# Patient Record
Sex: Female | Born: 1935
Health system: Southern US, Community
[De-identification: ages and names within clinical notes are randomized; demographics above are authoritative.]

## PROBLEM LIST (undated history)

## (undated) DIAGNOSIS — M1990 Unspecified osteoarthritis, unspecified site: Secondary | ICD-10-CM

## (undated) DIAGNOSIS — N8110 Cystocele, unspecified: Secondary | ICD-10-CM

## (undated) DIAGNOSIS — I10 Essential (primary) hypertension: Secondary | ICD-10-CM

## (undated) DIAGNOSIS — K8020 Calculus of gallbladder without cholecystitis without obstruction: Secondary | ICD-10-CM

## (undated) HISTORY — PX: ABDOMINAL HYSTERECTOMY: SHX81

## (undated) HISTORY — PX: HEMORROIDECTOMY: SUR656

## (undated) HISTORY — PX: KNEE SURGERY: SHX244

---

## 1999-05-31 ENCOUNTER — Encounter: Payer: Self-pay | Admitting: Emergency Medicine

## 1999-05-31 ENCOUNTER — Emergency Department (HOSPITAL_COMMUNITY): Admission: EM | Admit: 1999-05-31 | Discharge: 1999-05-31 | Payer: Self-pay | Admitting: Emergency Medicine

## 2000-09-22 ENCOUNTER — Encounter: Admission: RE | Admit: 2000-09-22 | Discharge: 2000-09-22 | Payer: Self-pay | Admitting: Obstetrics and Gynecology

## 2000-09-22 ENCOUNTER — Encounter: Payer: Self-pay | Admitting: Obstetrics and Gynecology

## 2000-09-25 ENCOUNTER — Encounter: Payer: Self-pay | Admitting: Obstetrics and Gynecology

## 2000-09-25 ENCOUNTER — Encounter: Admission: RE | Admit: 2000-09-25 | Discharge: 2000-09-25 | Payer: Self-pay | Admitting: Obstetrics and Gynecology

## 2001-03-01 ENCOUNTER — Encounter: Payer: Self-pay | Admitting: General Surgery

## 2001-03-01 ENCOUNTER — Ambulatory Visit (HOSPITAL_COMMUNITY): Admission: RE | Admit: 2001-03-01 | Discharge: 2001-03-01 | Payer: Self-pay | Admitting: General Surgery

## 2001-04-20 ENCOUNTER — Encounter: Admission: RE | Admit: 2001-04-20 | Discharge: 2001-04-20 | Payer: Self-pay | Admitting: Internal Medicine

## 2001-04-20 ENCOUNTER — Encounter: Payer: Self-pay | Admitting: Internal Medicine

## 2002-06-22 ENCOUNTER — Encounter: Payer: Self-pay | Admitting: Internal Medicine

## 2002-06-22 ENCOUNTER — Encounter: Admission: RE | Admit: 2002-06-22 | Discharge: 2002-06-22 | Payer: Self-pay | Admitting: Internal Medicine

## 2003-09-01 ENCOUNTER — Encounter: Admission: RE | Admit: 2003-09-01 | Discharge: 2003-09-01 | Payer: Self-pay | Admitting: Internal Medicine

## 2003-09-01 ENCOUNTER — Encounter: Payer: Self-pay | Admitting: Internal Medicine

## 2004-09-20 ENCOUNTER — Encounter: Admission: RE | Admit: 2004-09-20 | Discharge: 2004-09-20 | Payer: Self-pay | Admitting: Internal Medicine

## 2005-08-07 ENCOUNTER — Ambulatory Visit: Payer: Self-pay | Admitting: Gastroenterology

## 2005-09-02 ENCOUNTER — Ambulatory Visit: Payer: Self-pay | Admitting: Gastroenterology

## 2005-09-10 ENCOUNTER — Ambulatory Visit: Payer: Self-pay | Admitting: Gastroenterology

## 2005-10-14 ENCOUNTER — Ambulatory Visit: Payer: Self-pay | Admitting: Gastroenterology

## 2005-12-31 ENCOUNTER — Encounter: Admission: RE | Admit: 2005-12-31 | Discharge: 2005-12-31 | Payer: Self-pay | Admitting: Obstetrics and Gynecology

## 2007-03-25 ENCOUNTER — Encounter: Admission: RE | Admit: 2007-03-25 | Discharge: 2007-03-25 | Payer: Self-pay | Admitting: Internal Medicine

## 2009-02-16 ENCOUNTER — Encounter: Admission: RE | Admit: 2009-02-16 | Discharge: 2009-02-16 | Payer: Self-pay | Admitting: Internal Medicine

## 2009-10-01 ENCOUNTER — Encounter: Admission: RE | Admit: 2009-10-01 | Discharge: 2009-10-01 | Payer: Self-pay | Admitting: Specialist

## 2010-05-01 ENCOUNTER — Ambulatory Visit: Payer: Self-pay | Admitting: Surgery

## 2010-05-01 ENCOUNTER — Ambulatory Visit
Admission: RE | Admit: 2010-05-01 | Discharge: 2010-05-01 | Payer: Self-pay | Source: Home / Self Care | Admitting: Specialist

## 2011-01-19 ENCOUNTER — Encounter: Payer: Self-pay | Admitting: Internal Medicine

## 2011-11-21 ENCOUNTER — Encounter: Payer: Self-pay | Admitting: *Deleted

## 2011-11-21 ENCOUNTER — Emergency Department (HOSPITAL_COMMUNITY)
Admission: EM | Admit: 2011-11-21 | Discharge: 2011-11-21 | Disposition: A | Discharge status: Home / Self Care | Payer: Medicare Other | Attending: Emergency Medicine | Admitting: Emergency Medicine

## 2011-11-21 DIAGNOSIS — Z79899 Other long term (current) drug therapy: Secondary | ICD-10-CM | POA: Insufficient documentation

## 2011-11-21 DIAGNOSIS — Z7982 Long term (current) use of aspirin: Secondary | ICD-10-CM | POA: Insufficient documentation

## 2011-11-21 DIAGNOSIS — S61219A Laceration without foreign body of unspecified finger without damage to nail, initial encounter: Secondary | ICD-10-CM

## 2011-11-21 DIAGNOSIS — I10 Essential (primary) hypertension: Secondary | ICD-10-CM | POA: Insufficient documentation

## 2011-11-21 DIAGNOSIS — S61209A Unspecified open wound of unspecified finger without damage to nail, initial encounter: Secondary | ICD-10-CM | POA: Insufficient documentation

## 2011-11-21 DIAGNOSIS — Z9889 Other specified postprocedural states: Secondary | ICD-10-CM | POA: Insufficient documentation

## 2011-11-21 DIAGNOSIS — W260XXA Contact with knife, initial encounter: Secondary | ICD-10-CM | POA: Insufficient documentation

## 2011-11-21 DIAGNOSIS — W261XXA Contact with sword or dagger, initial encounter: Secondary | ICD-10-CM | POA: Insufficient documentation

## 2011-11-21 DIAGNOSIS — Y93G1 Activity, food preparation and clean up: Secondary | ICD-10-CM | POA: Insufficient documentation

## 2011-11-21 HISTORY — DX: Essential (primary) hypertension: I10

## 2011-11-21 MED ORDER — TETANUS-DIPHTH-ACELL PERTUSSIS 5-2.5-18.5 LF-MCG/0.5 IM SUSP: 0.5000 mL | Freq: Once | INTRAMUSCULAR | Status: DC

## 2011-11-21 MED ORDER — TETANUS-DIPHTH-ACELL PERTUSSIS 5-2.5-18.5 LF-MCG/0.5 IM SUSP
INTRAMUSCULAR | Status: AC
  Administered 2011-11-21: 0.5 mL via INTRAMUSCULAR
  Filled 2011-11-21: qty 0.5

## 2011-11-21 NOTE — ED Notes (Signed)
dermabond to bedside for md.

## 2011-11-21 NOTE — ED Notes (Signed)
Washing dishes in sink, knife cut finger, lac to tip of R ring finger, bleeding controlled with dressing. Td UTD <5 yrs.

## 2011-11-21 NOTE — ED Provider Notes (Signed)
History     CSN: [id number] Arrival date & time: 11/21/2011  6:47 AM   First MD Initiated Contact with Patient 11/21/11 [phone]      Chief Complaint  Patient presents with  . Laceration    R ring finger tip lac    (Consider location/radiation/quality/duration/timing/severity/associated sxs/prior treatment) Patient is a 77 y.o. female presenting with skin laceration.  Laceration    laceration right ring finger that occurred just prior to arrival while washing dishes.  No difficulty ranging her finger.  She reports mild bleeding at this time.  Takes aspirin.  She has otherwise minimal medical problems.  She has no numbness or tingling.  Her symptoms are constant.  Her tetanus is not up-to-date.  Her symptoms are mild  Past Medical History  Diagnosis Date  . Hypertension     Past Surgical History  Procedure Date  . Abdominal hysterectomy   . Hemorroidectomy   . Knee surgery     History reviewed. No pertinent family history.  History  Substance Use Topics  . Smoking status: Never Smoker   . Smokeless tobacco: Not on file  . Alcohol Use: No    OB History    Grav Para Term Preterm Abortions TAB SAB Ect Mult Living                  Review of Systems  All other systems reviewed and are negative.    Allergies  Review of patient's allergies indicates no known allergies.  Home Medications   Current Outpatient Rx  Name Route Sig Dispense Refill  . BIOTIN 2.5 MG PO TABS Oral Take 1 tablet by mouth daily.      Marland Kitchen CALCIUM 600 + D PO Oral Take 1 tablet by mouth 2 (two) times daily.      Marland Kitchen CITALOPRAM HYDROBROMIDE 20 MG PO TABS Oral Take 20 mg by mouth daily.      Marland Kitchen ESTRADIOL 0.5 MG PO TABS Oral Take 0.5 mg by mouth daily.      . ONE-A-DAY WOMENS 50 PLUS PO Oral Take 1 tablet by mouth daily.      Marland Kitchen FISH OIL PO Oral Take 1 tablet by mouth daily.      Marland Kitchen POLYETHYLENE GLYCOL 3350 PO POWD Oral Take 17 g by mouth daily.      Marland Kitchen VALSARTAN-HYDROCHLOROTHIAZIDE 320-25 MG PO TABS  Oral Take 1 tablet by mouth daily.      Marland Kitchen VITAMIN E PO Oral Take 1 tablet by mouth daily.        BP 179/81  Pulse 64  Temp(Src) 97.7 F (36.5 C) (Oral)  Resp 20  SpO2 95%  Physical Exam  Constitutional: She is oriented to person, place, and time. She appears well-developed and well-nourished.  HENT:  Head: Normocephalic.  Eyes: EOM are normal.  Neck: Normal range of motion.  Pulmonary/Chest: Effort normal.  Musculoskeletal: Normal range of motion.       Distal phalanx of right ring finger with superficial laceration.  Patient has normal flexion of her right ring finger.  The laceration is on the volar surface  Neurological: She is alert and oriented to person, place, and time.  Psychiatric: She has a normal mood and affect.    ED Course  Procedures (including critical care time)  LACERATION REPAIR Performed by: Lyanne Co Consent: Verbal consent obtained. Risks and benefits: risks, benefits and alternatives were discussed Patient identity confirmed: provided demographic data Time out performed prior to procedure Prepped and Draped in normal  sterile fashion Wound explored  Laceration Location: Volar surface of left ring finger distal phalynx  Laceration Length: 1cm  No Foreign Bodies seen or palpated  Anesthesia: local infiltration  Local anesthetic:none   Irrigation method: skin scrub Amount of cleaning: standard  Skin closure: close  Technique: Tissue Adhesive  Patient tolerance: Patient tolerated the procedure well with no immediate complications.   Labs Reviewed - No data to display No results found.   1. Laceration of finger       MDM  Repaired with Dermabond.  Infection warnings given.  Normal tendon function         Lyanne Co, MD 11/21/11 [phone]

## 2013-11-10 ENCOUNTER — Encounter: Payer: Self-pay | Admitting: Podiatry

## 2013-11-10 ENCOUNTER — Ambulatory Visit (INDEPENDENT_AMBULATORY_CARE_PROVIDER_SITE_OTHER): Payer: Medicare Other | Admitting: Podiatry

## 2013-11-10 VITALS — BP 157/83 | HR 66 | Resp 16

## 2013-11-10 DIAGNOSIS — B351 Tinea unguium: Secondary | ICD-10-CM

## 2013-11-10 NOTE — Progress Notes (Signed)
N-DICOLORATION L-LT FOOT GREAT TOENAIL D-7 MONTHS O-SLOWLY C-SLOWLY A-SAME T-N /A

## 2013-11-10 NOTE — Progress Notes (Signed)
Subjective:     Patient ID: Kimberly Mcgee, female   DOB: [birth year], 79 y.o.   MRN: [id number]  HPI patient presents stating I am concerned about my left big toenail it is yellow and seems like it's not connected but not currently painful   Review of Systems     Objective:   Physical Exam  Nursing note and vitals reviewed. Constitutional: She is oriented to person, place, and time.  Cardiovascular: Intact distal pulses.   Neurological: She is oriented to person, place, and time.  Skin: Skin is warm.   hallux nail left is yellow on the distal one third and mildly loosened from the underlying nailbed with no pain when palpated     Assessment:     Mycotic nail infection with probable trauma left big toe    Plan:     Educated patient and carefully clean the distal corner. This should be self limiting and should grow out

## 2014-03-10 ENCOUNTER — Other Ambulatory Visit: Payer: Self-pay | Admitting: Internal Medicine

## 2014-03-10 DIAGNOSIS — R109 Unspecified abdominal pain: Secondary | ICD-10-CM

## 2014-03-17 ENCOUNTER — Ambulatory Visit
Admission: RE | Admit: 2014-03-17 | Discharge: 2014-03-17 | Disposition: A | Discharge status: Home / Self Care | Payer: Commercial Managed Care - HMO | Source: Ambulatory Visit | Attending: Internal Medicine | Admitting: Internal Medicine

## 2014-03-17 DIAGNOSIS — R109 Unspecified abdominal pain: Secondary | ICD-10-CM

## 2014-03-28 ENCOUNTER — Other Ambulatory Visit: Payer: Self-pay | Admitting: Internal Medicine

## 2014-03-28 DIAGNOSIS — K862 Cyst of pancreas: Secondary | ICD-10-CM

## 2014-04-04 ENCOUNTER — Ambulatory Visit
Admission: RE | Admit: 2014-04-04 | Discharge: 2014-04-04 | Disposition: A | Discharge status: Home / Self Care | Payer: Commercial Managed Care - HMO | Source: Ambulatory Visit | Attending: Internal Medicine | Admitting: Internal Medicine

## 2014-04-04 DIAGNOSIS — K862 Cyst of pancreas: Secondary | ICD-10-CM

## 2014-04-04 MED ORDER — GADOBENATE DIMEGLUMINE 529 MG/ML IV SOLN
15.0000 mL | Freq: Once | INTRAVENOUS | Status: AC | PRN
  Administered 2014-04-04: 15 mL via INTRAVENOUS

## 2014-04-07 ENCOUNTER — Other Ambulatory Visit: Payer: Self-pay | Admitting: Internal Medicine

## 2014-04-07 DIAGNOSIS — R935 Abnormal findings on diagnostic imaging of other abdominal regions, including retroperitoneum: Secondary | ICD-10-CM

## 2014-04-07 DIAGNOSIS — K862 Cyst of pancreas: Secondary | ICD-10-CM

## 2014-10-05 ENCOUNTER — Inpatient Hospital Stay: Admission: RE | Admit: 2014-10-05 | Payer: Commercial Managed Care - HMO | Source: Ambulatory Visit

## 2014-10-05 ENCOUNTER — Other Ambulatory Visit: Payer: Commercial Managed Care - HMO

## 2014-10-11 ENCOUNTER — Ambulatory Visit
Admission: RE | Admit: 2014-10-11 | Discharge: 2014-10-11 | Disposition: A | Discharge status: Home / Self Care | Payer: Commercial Managed Care - HMO | Source: Ambulatory Visit | Attending: Internal Medicine | Admitting: Internal Medicine

## 2014-10-11 DIAGNOSIS — R935 Abnormal findings on diagnostic imaging of other abdominal regions, including retroperitoneum: Secondary | ICD-10-CM

## 2014-10-11 DIAGNOSIS — K862 Cyst of pancreas: Secondary | ICD-10-CM

## 2014-10-11 MED ORDER — IOHEXOL 300 MG/ML  SOLN
100.0000 mL | Freq: Once | INTRAMUSCULAR | Status: AC | PRN
  Administered 2014-10-11: 100 mL via INTRAVENOUS

## 2015-01-04 ENCOUNTER — Encounter: Payer: Self-pay | Admitting: Gastroenterology

## 2015-08-25 ENCOUNTER — Encounter: Payer: Self-pay | Admitting: Internal Medicine

## 2015-09-25 ENCOUNTER — Emergency Department (HOSPITAL_COMMUNITY): Payer: Commercial Managed Care - HMO

## 2015-09-25 ENCOUNTER — Encounter (HOSPITAL_COMMUNITY): Payer: Self-pay

## 2015-09-25 ENCOUNTER — Emergency Department (HOSPITAL_COMMUNITY)
Admission: EM | Admit: 2015-09-25 | Discharge: 2015-09-25 | Disposition: A | Discharge status: Home / Self Care | Payer: Commercial Managed Care - HMO | Attending: Emergency Medicine | Admitting: Emergency Medicine

## 2015-09-25 DIAGNOSIS — N390 Urinary tract infection, site not specified: Secondary | ICD-10-CM | POA: Insufficient documentation

## 2015-09-25 DIAGNOSIS — I10 Essential (primary) hypertension: Secondary | ICD-10-CM | POA: Diagnosis not present

## 2015-09-25 DIAGNOSIS — Z79899 Other long term (current) drug therapy: Secondary | ICD-10-CM | POA: Diagnosis not present

## 2015-09-25 DIAGNOSIS — R410 Disorientation, unspecified: Secondary | ICD-10-CM | POA: Insufficient documentation

## 2015-09-25 DIAGNOSIS — R4182 Altered mental status, unspecified: Secondary | ICD-10-CM | POA: Diagnosis present

## 2015-09-25 LAB — COMPREHENSIVE METABOLIC PANEL
ALBUMIN: 4.3 g/dL (ref 3.5–5.0)
ALT: 17 U/L (ref 14–54)
ANION GAP: 10 (ref 5–15)
AST: 28 U/L (ref 15–41)
Alkaline Phosphatase: 41 U/L (ref 38–126)
BUN: 21 mg/dL — ABNORMAL HIGH (ref 6–20)
CHLORIDE: 100 mmol/L — AB (ref 101–111)
CO2: 23 mmol/L (ref 22–32)
Calcium: 10.2 mg/dL (ref 8.9–10.3)
Creatinine, Ser: 0.91 mg/dL (ref 0.44–1.00)
GFR calc non Af Amer: 55 mL/min — ABNORMAL LOW (ref >60)
Glucose, Bld: 111 mg/dL — ABNORMAL HIGH (ref 65–99)
Potassium: 3.8 mmol/L (ref 3.5–5.1)
SODIUM: 133 mmol/L — AB (ref 135–145)
Total Bilirubin: 0.5 mg/dL (ref 0.3–1.2)
Total Protein: 7.6 g/dL (ref 6.5–8.1)

## 2015-09-25 LAB — URINALYSIS, ROUTINE W REFLEX MICROSCOPIC
BILIRUBIN URINE: NEGATIVE
Glucose, UA: NEGATIVE mg/dL
Ketones, ur: NEGATIVE mg/dL
NITRITE: NEGATIVE
PH: 5.5 (ref 5.0–8.0)
Protein, ur: NEGATIVE mg/dL
SPECIFIC GRAVITY, URINE: 1.013 (ref 1.005–1.030)
Urobilinogen, UA: 0.2 mg/dL (ref 0.0–1.0)

## 2015-09-25 LAB — CBC
HEMATOCRIT: 40.6 % (ref 36.0–46.0)
HEMOGLOBIN: 13.6 g/dL (ref 12.0–15.0)
MCH: 30.2 pg (ref 26.0–34.0)
MCHC: 33.5 g/dL (ref 30.0–36.0)
MCV: 90.2 fL (ref 78.0–100.0)
Platelets: 258 10*3/uL (ref 150–400)
RBC: 4.5 MIL/uL (ref 3.87–5.11)
RDW: 14.1 % (ref 11.5–15.5)
WBC: 11.8 10*3/uL — ABNORMAL HIGH (ref 4.0–10.5)

## 2015-09-25 LAB — URINE MICROSCOPIC-ADD ON

## 2015-09-25 LAB — CBG MONITORING, ED: Glucose-Capillary: 101 mg/dL — ABNORMAL HIGH (ref 65–99)

## 2015-09-25 MED ORDER — CEPHALEXIN 500 MG PO CAPS: 500.0000 mg | ORAL_CAPSULE | Freq: Two times a day (BID) | ORAL | Status: DC

## 2015-09-25 NOTE — ED Provider Notes (Signed)
CSN: [id number]     Arrival date & time 09/25/15  1133 History   First MD Initiated Contact with Patient 09/25/15 1207     Chief Complaint  Patient presents with  . Altered Mental Status     (Consider location/radiation/quality/duration/timing/severity/associated sxs/prior Treatment) HPI  81 year old female presents with altered mental status that has essentially resolved. Started around 10:30 after getting out of a procedure at her OB/GYN where she had a pessary removed. Patient typically has this removed every 3 months and changed. After the procedure, where she was not given any sedating medicines, she had trouble remembering where her car was it seemed confused about getting onto the bed or going in which direction. Is definitely abnormal for her. By the time the son arrived 30 minutes later she was essentially back to normal for him. No fevers, vomiting, headache, weakness, slurred speech. No urinary symptoms.  Past Medical History  Diagnosis Date  . Hypertension    Past Surgical History  Procedure Laterality Date  . Abdominal hysterectomy    . Hemorroidectomy    . Knee surgery     History reviewed. No pertinent family history. Social History  Substance Use Topics  . Smoking status: Never Smoker   . Smokeless tobacco: None  . Alcohol Use: No   OB History    No data available     Review of Systems  Constitutional: Negative for fever.  Respiratory: Negative for cough.   Cardiovascular: Negative for chest pain.  Genitourinary: Negative for dysuria.  Neurological: Negative for speech difficulty, weakness, numbness and headaches.  Psychiatric/Behavioral: Positive for confusion.  All other systems reviewed and are negative.     Allergies  Review of patient's allergies indicates no known allergies.  Home Medications   Prior to Admission medications   Medication Sig Start Date End Date Taking? Authorizing Provider  BIOTIN PO Take 1 tablet by mouth daily.   Yes  Historical Provider, MD  Calcium Carbonate-Vitamin D (CALCIUM 600 + D PO) Take 1 tablet by mouth 2 (two) times daily.     Yes Historical Provider, MD  citalopram (CELEXA) 20 MG tablet Take 20 mg by mouth daily.     Yes Historical Provider, MD  loratadine (CLARITIN) 10 MG tablet Take 10 mg by mouth daily as needed for allergies.   Yes Historical Provider, MD  losartan-hydrochlorothiazide (HYZAAR) 100-25 MG per tablet Take 1 tablet by mouth daily.  09/12/13  Yes Historical Provider, MD  Multiple Vitamins-Minerals (ONE-A-DAY WOMENS 50 PLUS PO) Take 1 tablet by mouth daily.     Yes Historical Provider, MD  Omega-3 Fatty Acids (FISH OIL PO) Take 1 tablet by mouth daily.     Yes Historical Provider, MD  polyethylene glycol powder (GLYCOLAX/MIRALAX) powder Take 17 g by mouth daily.     Yes Historical Provider, MD  VITAMIN E PO Take 1 tablet by mouth daily.     Yes Historical Provider, MD  estradiol (ESTRACE) 0.5 MG tablet Take 0.5 mg by mouth daily.      Historical Provider, MD   BP 192/91 mmHg  Pulse 67  Temp(Src) 98.4 F (36.9 C) (Oral)  Resp 18  SpO2 97% Physical Exam  Constitutional: She is oriented to person, place, and time. She appears well-developed and well-nourished.  HENT:  Head: Normocephalic and atraumatic.  Right Ear: External ear normal.  Left Ear: External ear normal.  Nose: Nose normal.  Eyes: EOM are normal. Pupils are equal, round, and reactive to light. Right eye exhibits no discharge.  Left eye exhibits no discharge.  Neck: Neck supple.  Cardiovascular: Normal rate, regular rhythm and normal heart sounds.   Pulmonary/Chest: Effort normal and breath sounds normal.  Abdominal: Soft. There is no tenderness.  Neurological: She is alert and oriented to person, place, and time.  CN 2-12 grossly intact. 5/5 strength in all 4 extremities. Grossly normal sensation. Alert and oriented to person, place, time and situation  Skin: Skin is warm and dry.  Nursing note and vitals  reviewed.   ED Course  Procedures (including critical care time) Labs Review Labs Reviewed  COMPREHENSIVE METABOLIC PANEL - Abnormal; Notable for the following:    Sodium 133 (*)    Chloride 100 (*)    Glucose, Bld 111 (*)    BUN 21 (*)    GFR calc non Af Amer 55 (*)    All other components within normal limits  CBC - Abnormal; Notable for the following:    WBC 11.8 (*)    All other components within normal limits  URINALYSIS, ROUTINE W REFLEX MICROSCOPIC (NOT AT [facility]) - Abnormal; Notable for the following:    APPearance CLOUDY (*)    Hgb urine dipstick LARGE (*)    Leukocytes, UA LARGE (*)    All other components within normal limits  URINE MICROSCOPIC-ADD ON - Abnormal; Notable for the following:    Bacteria, UA FEW (*)    All other components within normal limits  CBG MONITORING, ED - Abnormal; Notable for the following:    Glucose-Capillary 101 (*)    All other components within normal limits  URINE CULTURE    Imaging Review Ct Head Wo Contrast  09/25/2015   CLINICAL DATA:  Altered mental status.  EXAM: CT HEAD WITHOUT CONTRAST  TECHNIQUE: Contiguous axial images were obtained from the base of the skull through the vertex without intravenous contrast.  COMPARISON:  None.  FINDINGS: Bony calvarium appears intact. Mild diffuse cortical atrophy is noted. Mild chronic ischemic white matter disease is noted. No mass effect or midline shift is noted. Ventricular size is within normal limits. There is no evidence of mass lesion, hemorrhage or acute infarction.  IMPRESSION: Mild diffuse cortical atrophy. Mild chronic ischemic white matter disease. No acute intracranial abnormality seen.   Electronically Signed   By: Marijo Conception, M.D.   On: 09/25/2015 13:22   I have personally reviewed and evaluated these images and lab results as part of my medical decision-making.   EKG Interpretation   Date/Time:  Tuesday September 25 2015 12:40:51 EDT Ventricular Rate:  67 PR Interval:   182 QRS Duration: 81 QT Interval:  413 QTC Calculation: 436 R Axis:   -25 Text Interpretation:  Sinus rhythm Borderline left axis deviation Abnormal  R-wave progression, early transition Consider anterior infarct Minimal ST  depression, lateral leads no significant change since 2000 Confirmed by  Bhavya Eschete  MD, Renessa Wellnitz ([id number]) on 09/25/2015 12:42:56 PM      MDM   Final diagnoses:  Transient confusion  UTI (lower urinary tract infection)    Patient is well-appearing here with a normal neuro exam. I doubt this is a TIA given her only symptom was transient confusion. There were no other symptoms that would indicate a vascular distribution of a stroke/TIA. Could be related to a likely UTI seen on the labs although this could be related to recent instrumentation. Discussed with neurology, Dr. Nicole Kindred, who agrees this is very unlikely to be stroke/TIA related given it would be unlikely to be a vascular  distribution causing some confusion only. Could be related to HTN but was hypertensive in ED and asymptomatic now, less likely. Plan to treat with antibiotics and close follow-up with PCP. Given strict return precautions.    Sherwood Gambler, MD 09/25/15 1536

## 2015-09-25 NOTE — ED Notes (Signed)
Discharge instructions given, no further questions or needs. Patient departed ambulatory with no apparent distress.

## 2015-09-25 NOTE — ED Notes (Signed)
Patient transported to CT 

## 2015-09-25 NOTE — Discharge Instructions (Signed)
Altered Mental Status °Altered mental status most often refers to an abnormal change in your responsiveness and awareness. It can affect your speech, thought, mobility, memory, attention span, or alertness. It can range from slight confusion to complete unresponsiveness (coma). Altered mental status can be a sign of a serious underlying medical condition. Rapid evaluation and medical treatment is necessary for patients having an altered mental status. °CAUSES  °· Low blood sugar (hypoglycemia) or diabetes. °· Severe loss of body fluids (dehydration) or a body salt (electrolyte) imbalance. °· A stroke or other neurologic problem, such as dementia or delirium. °· A head injury or tumor. °· A drug or alcohol overdose. °· Exposure to toxins or poisons. °· Depression, anxiety, and stress. °· A low oxygen level (hypoxia). °· An infection. °· Blood loss. °· Twitching or shaking (seizure). °· Heart problems, such as heart attack or heart rhythm problems (arrhythmias). °· A body temperature that is too low or too high (hypothermia or hyperthermia). °DIAGNOSIS  °A diagnosis is based on your history, symptoms, physical and neurologic examinations, and diagnostic tests. Diagnostic tests may include: °· Measurement of your blood pressure, pulse, breathing, and oxygen levels (vital signs). °· Blood tests. °· Urine tests. °· X-ray exams. °· A computerized magnetic scan (magnetic resonance imaging, MRI). °· A computerized X-ray scan (computed tomography, CT scan). °TREATMENT  °Treatment will depend on the cause. Treatment may include: °· Management of an underlying medical or mental health condition. °· Critical care or support in the hospital. °HOME CARE INSTRUCTIONS  °· Only take over-the-counter or prescription medicines for pain, discomfort, or fever as directed by your caregiver. °· Manage underlying conditions as directed by your caregiver. °· Eat a healthy, well-balanced diet to maintain strength. °· Join a support group or  prevention program to cope with the condition or trauma that caused the altered mental status. Ask your caregiver to help choose a program that works for you. °· Follow up with your caregiver for further examination, therapy, or testing as directed. °SEEK MEDICAL CARE IF:  °· You feel unwell or have chills. °· You or your family notice a change in your behavior or your alertness. °· You have trouble following your caregiver's treatment plan. °· You have questions or concerns. °SEEK IMMEDIATE MEDICAL CARE IF:  °· You have a rapid heartbeat or have chest pain. °· You have difficulty breathing. °· You have a fever. °· You have a headache with a stiff neck. °· You cough up blood. °· You have blood in your urine or stool. °· You have severe agitation or confusion. °MAKE SURE YOU:  °· Understand these instructions. °· Will watch your condition. °· Will get help right away if you are not doing well or get worse. °Document Released: 06/04/2010 Document Revised: 03/08/2012 Document Reviewed: 06/04/2010 °[employer]® Patient Information ©2015 [employer]. This information is not intended to replace advice given to you by your health care provider. Make sure you discuss any questions you have with your health care provider. °Urinary Tract Infection °Urinary tract infections (UTIs) can develop anywhere along your urinary tract. Your urinary tract is your body's drainage system for removing wastes and extra water. Your urinary tract includes two kidneys, two ureters, a bladder, and a urethra. Your kidneys are a pair of bean-shaped organs. Each kidney is about the size of your fist. They are located below your ribs, one on each side of your spine. °CAUSES °Infections are caused by microbes, which are microscopic organisms, including fungi, viruses, and bacteria. These   organisms are so small that they can only be seen through a microscope. Bacteria are the microbes that most commonly cause UTIs. °SYMPTOMS  °Symptoms of UTIs may  vary by age and gender of the patient and by the location of the infection. Symptoms in young women typically include a frequent and intense urge to urinate and a painful, burning feeling in the bladder or urethra during urination. Older women and men are more likely to be tired, shaky, and weak and have muscle aches and abdominal pain. A fever may mean the infection is in your kidneys. Other symptoms of a kidney infection include pain in your back or sides below the ribs, nausea, and vomiting. °DIAGNOSIS °To diagnose a UTI, your caregiver will ask you about your symptoms. Your caregiver also will ask to provide a urine sample. The urine sample will be tested for bacteria and white blood cells. White blood cells are made by your body to help fight infection. °TREATMENT  °Typically, UTIs can be treated with medication. Because most UTIs are caused by a bacterial infection, they usually can be treated with the use of antibiotics. The choice of antibiotic and length of treatment depend on your symptoms and the type of bacteria causing your infection. °HOME CARE INSTRUCTIONS °· If you were prescribed antibiotics, take them exactly as your caregiver instructs you. Finish the medication even if you feel better after you have only taken some of the medication. °· Drink enough water and fluids to keep your urine clear or pale yellow. °· Avoid caffeine, tea, and carbonated beverages. They tend to irritate your bladder. °· Empty your bladder often. Avoid holding urine for long periods of time. °· Empty your bladder before and after sexual intercourse. °· After a bowel movement, women should cleanse from front to back. Use each tissue only once. °SEEK MEDICAL CARE IF:  °· You have back pain. °· You develop a fever. °· Your symptoms do not begin to resolve within 3 days. °SEEK IMMEDIATE MEDICAL CARE IF:  °· You have severe back pain or lower abdominal pain. °· You develop chills. °· You have nausea or vomiting. °· You have  continued burning or discomfort with urination. °MAKE SURE YOU:  °· Understand these instructions. °· Will watch your condition. °· Will get help right away if you are not doing well or get worse. °Document Released: 09/24/2005 Document Revised: 06/15/2012 Document Reviewed: 01/23/2012 °ExitCare® Patient Information ©2015 [employer]. This information is not intended to replace advice given to you by your health care provider. Make sure you discuss any questions you have with your health care provider. ° °

## 2015-09-25 NOTE — ED Notes (Signed)
Per pt, she was at ob/gyn procedure.  Pt has device changed every 3 months for bladder incontinence.  Once device removed, pt became confused.  Pt knew her name.  Couldn't quite understand task as given to her.  No meds given for procedure.    Pt is alert and oriented.  Pt passed neuro exam.  Pt states she felt like she was confused. Pt states procedure was painful.

## 2015-09-26 LAB — URINE CULTURE: Culture: 5000

## 2016-01-04 DIAGNOSIS — M859 Disorder of bone density and structure, unspecified: Secondary | ICD-10-CM | POA: Diagnosis not present

## 2016-01-04 DIAGNOSIS — N183 Chronic kidney disease, stage 3 (moderate): Secondary | ICD-10-CM | POA: Diagnosis not present

## 2016-01-04 DIAGNOSIS — E559 Vitamin D deficiency, unspecified: Secondary | ICD-10-CM | POA: Diagnosis not present

## 2016-01-09 DIAGNOSIS — M1711 Unilateral primary osteoarthritis, right knee: Secondary | ICD-10-CM | POA: Diagnosis not present

## 2016-01-11 DIAGNOSIS — H43813 Vitreous degeneration, bilateral: Secondary | ICD-10-CM | POA: Diagnosis not present

## 2016-01-11 DIAGNOSIS — H25813 Combined forms of age-related cataract, bilateral: Secondary | ICD-10-CM | POA: Diagnosis not present

## 2016-01-11 DIAGNOSIS — H26492 Other secondary cataract, left eye: Secondary | ICD-10-CM | POA: Diagnosis not present

## 2016-01-11 DIAGNOSIS — Z961 Presence of intraocular lens: Secondary | ICD-10-CM | POA: Diagnosis not present

## 2016-01-17 DIAGNOSIS — H2640 Unspecified secondary cataract: Secondary | ICD-10-CM | POA: Diagnosis not present

## 2016-01-17 DIAGNOSIS — H26492 Other secondary cataract, left eye: Secondary | ICD-10-CM | POA: Diagnosis not present

## 2016-01-22 DIAGNOSIS — H25031 Anterior subcapsular polar age-related cataract, right eye: Secondary | ICD-10-CM | POA: Diagnosis not present

## 2016-01-22 DIAGNOSIS — H25811 Combined forms of age-related cataract, right eye: Secondary | ICD-10-CM | POA: Diagnosis not present

## 2016-01-22 DIAGNOSIS — H25041 Posterior subcapsular polar age-related cataract, right eye: Secondary | ICD-10-CM | POA: Diagnosis not present

## 2016-01-22 DIAGNOSIS — H25011 Cortical age-related cataract, right eye: Secondary | ICD-10-CM | POA: Diagnosis not present

## 2016-01-22 DIAGNOSIS — H2511 Age-related nuclear cataract, right eye: Secondary | ICD-10-CM | POA: Diagnosis not present

## 2016-02-15 DIAGNOSIS — T1511XA Foreign body in conjunctival sac, right eye, initial encounter: Secondary | ICD-10-CM | POA: Diagnosis not present

## 2016-03-31 DIAGNOSIS — I10 Essential (primary) hypertension: Secondary | ICD-10-CM | POA: Diagnosis not present

## 2016-03-31 DIAGNOSIS — N390 Urinary tract infection, site not specified: Secondary | ICD-10-CM | POA: Diagnosis not present

## 2016-03-31 DIAGNOSIS — R8299 Other abnormal findings in urine: Secondary | ICD-10-CM | POA: Diagnosis not present

## 2016-03-31 DIAGNOSIS — M859 Disorder of bone density and structure, unspecified: Secondary | ICD-10-CM | POA: Diagnosis not present

## 2016-03-31 DIAGNOSIS — R739 Hyperglycemia, unspecified: Secondary | ICD-10-CM | POA: Diagnosis not present

## 2016-04-03 DIAGNOSIS — E669 Obesity, unspecified: Secondary | ICD-10-CM | POA: Diagnosis not present

## 2016-04-03 DIAGNOSIS — I700 Atherosclerosis of aorta: Secondary | ICD-10-CM | POA: Diagnosis not present

## 2016-04-03 DIAGNOSIS — Z0000 Encounter for general adult medical examination without abnormal findings: Secondary | ICD-10-CM | POA: Diagnosis not present

## 2016-04-03 DIAGNOSIS — I129 Hypertensive chronic kidney disease with stage 1 through stage 4 chronic kidney disease, or unspecified chronic kidney disease: Secondary | ICD-10-CM | POA: Diagnosis not present

## 2016-04-03 DIAGNOSIS — Z1389 Encounter for screening for other disorder: Secondary | ICD-10-CM | POA: Diagnosis not present

## 2016-04-03 DIAGNOSIS — E559 Vitamin D deficiency, unspecified: Secondary | ICD-10-CM | POA: Diagnosis not present

## 2016-04-03 DIAGNOSIS — R739 Hyperglycemia, unspecified: Secondary | ICD-10-CM | POA: Diagnosis not present

## 2016-04-03 DIAGNOSIS — Z6830 Body mass index (BMI) 30.0-30.9, adult: Secondary | ICD-10-CM | POA: Diagnosis not present

## 2016-04-03 DIAGNOSIS — C4491 Basal cell carcinoma of skin, unspecified: Secondary | ICD-10-CM | POA: Diagnosis not present

## 2016-04-03 DIAGNOSIS — N329 Bladder disorder, unspecified: Secondary | ICD-10-CM | POA: Diagnosis not present

## 2016-04-03 DIAGNOSIS — I10 Essential (primary) hypertension: Secondary | ICD-10-CM | POA: Diagnosis not present

## 2016-05-01 DIAGNOSIS — Z0389 Encounter for observation for other suspected diseases and conditions ruled out: Secondary | ICD-10-CM | POA: Diagnosis not present

## 2016-06-10 DIAGNOSIS — Z6829 Body mass index (BMI) 29.0-29.9, adult: Secondary | ICD-10-CM | POA: Diagnosis not present

## 2016-06-10 DIAGNOSIS — J209 Acute bronchitis, unspecified: Secondary | ICD-10-CM | POA: Diagnosis not present

## 2016-09-02 DIAGNOSIS — L821 Other seborrheic keratosis: Secondary | ICD-10-CM | POA: Diagnosis not present

## 2016-09-02 DIAGNOSIS — L304 Erythema intertrigo: Secondary | ICD-10-CM | POA: Diagnosis not present

## 2016-09-02 DIAGNOSIS — D225 Melanocytic nevi of trunk: Secondary | ICD-10-CM | POA: Diagnosis not present

## 2016-09-02 DIAGNOSIS — B029 Zoster without complications: Secondary | ICD-10-CM | POA: Diagnosis not present

## 2016-09-02 DIAGNOSIS — L814 Other melanin hyperpigmentation: Secondary | ICD-10-CM | POA: Diagnosis not present

## 2016-09-02 DIAGNOSIS — L570 Actinic keratosis: Secondary | ICD-10-CM | POA: Diagnosis not present

## 2016-10-10 DIAGNOSIS — M25561 Pain in right knee: Secondary | ICD-10-CM | POA: Diagnosis not present

## 2016-10-10 DIAGNOSIS — I10 Essential (primary) hypertension: Secondary | ICD-10-CM | POA: Diagnosis not present

## 2016-10-10 DIAGNOSIS — N183 Chronic kidney disease, stage 3 (moderate): Secondary | ICD-10-CM | POA: Diagnosis not present

## 2016-10-10 DIAGNOSIS — E668 Other obesity: Secondary | ICD-10-CM | POA: Diagnosis not present

## 2016-10-10 DIAGNOSIS — I700 Atherosclerosis of aorta: Secondary | ICD-10-CM | POA: Diagnosis not present

## 2016-10-10 DIAGNOSIS — R7309 Other abnormal glucose: Secondary | ICD-10-CM | POA: Diagnosis not present

## 2016-10-10 DIAGNOSIS — F411 Generalized anxiety disorder: Secondary | ICD-10-CM | POA: Diagnosis not present

## 2016-10-10 DIAGNOSIS — Z6830 Body mass index (BMI) 30.0-30.9, adult: Secondary | ICD-10-CM | POA: Diagnosis not present

## 2016-10-10 DIAGNOSIS — N3289 Other specified disorders of bladder: Secondary | ICD-10-CM | POA: Diagnosis not present

## 2016-10-10 DIAGNOSIS — I129 Hypertensive chronic kidney disease with stage 1 through stage 4 chronic kidney disease, or unspecified chronic kidney disease: Secondary | ICD-10-CM | POA: Diagnosis not present

## 2016-10-10 DIAGNOSIS — K5909 Other constipation: Secondary | ICD-10-CM | POA: Diagnosis not present

## 2017-03-31 DIAGNOSIS — I10 Essential (primary) hypertension: Secondary | ICD-10-CM | POA: Diagnosis not present

## 2017-03-31 DIAGNOSIS — E781 Pure hyperglyceridemia: Secondary | ICD-10-CM | POA: Diagnosis not present

## 2017-03-31 DIAGNOSIS — R7309 Other abnormal glucose: Secondary | ICD-10-CM | POA: Diagnosis not present

## 2017-03-31 DIAGNOSIS — M859 Disorder of bone density and structure, unspecified: Secondary | ICD-10-CM | POA: Diagnosis not present

## 2017-03-31 DIAGNOSIS — N390 Urinary tract infection, site not specified: Secondary | ICD-10-CM | POA: Diagnosis not present

## 2017-03-31 DIAGNOSIS — Z1159 Encounter for screening for other viral diseases: Secondary | ICD-10-CM | POA: Diagnosis not present

## 2017-04-07 DIAGNOSIS — M25561 Pain in right knee: Secondary | ICD-10-CM | POA: Diagnosis not present

## 2017-04-07 DIAGNOSIS — E1122 Type 2 diabetes mellitus with diabetic chronic kidney disease: Secondary | ICD-10-CM | POA: Diagnosis not present

## 2017-04-07 DIAGNOSIS — M179 Osteoarthritis of knee, unspecified: Secondary | ICD-10-CM | POA: Diagnosis not present

## 2017-04-07 DIAGNOSIS — K219 Gastro-esophageal reflux disease without esophagitis: Secondary | ICD-10-CM | POA: Diagnosis not present

## 2017-04-07 DIAGNOSIS — K5909 Other constipation: Secondary | ICD-10-CM | POA: Diagnosis not present

## 2017-04-07 DIAGNOSIS — Z6832 Body mass index (BMI) 32.0-32.9, adult: Secondary | ICD-10-CM | POA: Diagnosis not present

## 2017-04-07 DIAGNOSIS — I129 Hypertensive chronic kidney disease with stage 1 through stage 4 chronic kidney disease, or unspecified chronic kidney disease: Secondary | ICD-10-CM | POA: Diagnosis not present

## 2017-04-07 DIAGNOSIS — I700 Atherosclerosis of aorta: Secondary | ICD-10-CM | POA: Diagnosis not present

## 2017-04-07 DIAGNOSIS — Z1389 Encounter for screening for other disorder: Secondary | ICD-10-CM | POA: Diagnosis not present

## 2017-04-07 DIAGNOSIS — N329 Bladder disorder, unspecified: Secondary | ICD-10-CM | POA: Diagnosis not present

## 2017-04-07 DIAGNOSIS — Z0000 Encounter for general adult medical examination without abnormal findings: Secondary | ICD-10-CM | POA: Diagnosis not present

## 2017-04-07 DIAGNOSIS — N183 Chronic kidney disease, stage 3 (moderate): Secondary | ICD-10-CM | POA: Diagnosis not present

## 2017-04-09 DIAGNOSIS — L570 Actinic keratosis: Secondary | ICD-10-CM | POA: Diagnosis not present

## 2017-04-09 DIAGNOSIS — L219 Seborrheic dermatitis, unspecified: Secondary | ICD-10-CM | POA: Diagnosis not present

## 2017-04-09 DIAGNOSIS — L821 Other seborrheic keratosis: Secondary | ICD-10-CM | POA: Diagnosis not present

## 2017-04-16 DIAGNOSIS — Z1212 Encounter for screening for malignant neoplasm of rectum: Secondary | ICD-10-CM | POA: Diagnosis not present

## 2017-05-19 DIAGNOSIS — R8299 Other abnormal findings in urine: Secondary | ICD-10-CM | POA: Diagnosis not present

## 2017-05-19 DIAGNOSIS — R358 Other polyuria: Secondary | ICD-10-CM | POA: Diagnosis not present

## 2017-06-08 ENCOUNTER — Inpatient Hospital Stay (HOSPITAL_COMMUNITY)
Admission: EM | Admit: 2017-06-08 | Discharge: 2017-06-09 | DRG: 071 | Disposition: A | Discharge status: Home / Self Care | Payer: PPO | Attending: Internal Medicine | Admitting: Internal Medicine

## 2017-06-08 ENCOUNTER — Encounter (HOSPITAL_COMMUNITY): Payer: Self-pay | Admitting: Emergency Medicine

## 2017-06-08 ENCOUNTER — Emergency Department (HOSPITAL_COMMUNITY): Payer: PPO

## 2017-06-08 DIAGNOSIS — I10 Essential (primary) hypertension: Secondary | ICD-10-CM

## 2017-06-08 DIAGNOSIS — N390 Urinary tract infection, site not specified: Secondary | ICD-10-CM | POA: Diagnosis present

## 2017-06-08 DIAGNOSIS — N993 Prolapse of vaginal vault after hysterectomy: Secondary | ICD-10-CM | POA: Diagnosis present

## 2017-06-08 DIAGNOSIS — R4182 Altered mental status, unspecified: Secondary | ICD-10-CM | POA: Diagnosis not present

## 2017-06-08 DIAGNOSIS — G9341 Metabolic encephalopathy: Principal | ICD-10-CM | POA: Diagnosis present

## 2017-06-08 DIAGNOSIS — F329 Major depressive disorder, single episode, unspecified: Secondary | ICD-10-CM | POA: Diagnosis present

## 2017-06-08 DIAGNOSIS — Z79899 Other long term (current) drug therapy: Secondary | ICD-10-CM

## 2017-06-08 DIAGNOSIS — Z90710 Acquired absence of both cervix and uterus: Secondary | ICD-10-CM

## 2017-06-08 DIAGNOSIS — Z7982 Long term (current) use of aspirin: Secondary | ICD-10-CM | POA: Diagnosis not present

## 2017-06-08 DIAGNOSIS — G9340 Encephalopathy, unspecified: Secondary | ICD-10-CM | POA: Diagnosis present

## 2017-06-08 DIAGNOSIS — F4489 Other dissociative and conversion disorders: Secondary | ICD-10-CM | POA: Diagnosis not present

## 2017-06-08 DIAGNOSIS — R030 Elevated blood-pressure reading, without diagnosis of hypertension: Secondary | ICD-10-CM | POA: Diagnosis not present

## 2017-06-08 DIAGNOSIS — R9431 Abnormal electrocardiogram [ECG] [EKG]: Secondary | ICD-10-CM | POA: Diagnosis not present

## 2017-06-08 LAB — I-STAT CHEM 8, ED
BUN: 23 mg/dL — ABNORMAL HIGH (ref 6–20)
CREATININE: 0.8 mg/dL (ref 0.44–1.00)
Calcium, Ion: 1.22 mmol/L (ref 1.15–1.40)
Chloride: 100 mmol/L — ABNORMAL LOW (ref 101–111)
Glucose, Bld: 130 mg/dL — ABNORMAL HIGH (ref 65–99)
HEMATOCRIT: 41 % (ref 36.0–46.0)
HEMOGLOBIN: 13.9 g/dL (ref 12.0–15.0)
Potassium: 3.6 mmol/L (ref 3.5–5.1)
SODIUM: 139 mmol/L (ref 135–145)
TCO2: 25 mmol/L (ref 0–100)

## 2017-06-08 LAB — COMPREHENSIVE METABOLIC PANEL
ALBUMIN: 4.1 g/dL (ref 3.5–5.0)
ALT: 17 U/L (ref 14–54)
AST: 24 U/L (ref 15–41)
Alkaline Phosphatase: 44 U/L (ref 38–126)
Anion gap: 11 (ref 5–15)
BUN: 22 mg/dL — ABNORMAL HIGH (ref 6–20)
CO2: 24 mmol/L (ref 22–32)
CREATININE: 0.83 mg/dL (ref 0.44–1.00)
Calcium: 9.9 mg/dL (ref 8.9–10.3)
Chloride: 103 mmol/L (ref 101–111)
GFR calc Af Amer: >60 mL/min (ref >60)
GFR calc non Af Amer: >60 mL/min (ref >60)
GLUCOSE: 131 mg/dL — AB (ref 65–99)
Potassium: 3.6 mmol/L (ref 3.5–5.1)
SODIUM: 138 mmol/L (ref 135–145)
Total Bilirubin: 0.5 mg/dL (ref 0.3–1.2)
Total Protein: 7.6 g/dL (ref 6.5–8.1)

## 2017-06-08 LAB — CBC WITH DIFFERENTIAL/PLATELET
Basophils Absolute: 0.1 10*3/uL (ref 0.0–0.1)
Basophils Relative: 0 %
EOS ABS: 0.2 10*3/uL (ref 0.0–0.7)
Eosinophils Relative: 1 %
HEMATOCRIT: 41.3 % (ref 36.0–46.0)
HEMOGLOBIN: 13.8 g/dL (ref 12.0–15.0)
LYMPHS ABS: 2.2 10*3/uL (ref 0.7–4.0)
Lymphocytes Relative: 17 %
MCH: 29.8 pg (ref 26.0–34.0)
MCHC: 33.4 g/dL (ref 30.0–36.0)
MCV: 89.2 fL (ref 78.0–100.0)
Monocytes Absolute: 0.9 10*3/uL (ref 0.1–1.0)
Monocytes Relative: 7 %
NEUTROS ABS: 9.4 10*3/uL — AB (ref 1.7–7.7)
NEUTROS PCT: 75 %
Platelets: 269 10*3/uL (ref 150–400)
RBC: 4.63 MIL/uL (ref 3.87–5.11)
RDW: 14.5 % (ref 11.5–15.5)
WBC: 12.7 10*3/uL — AB (ref 4.0–10.5)

## 2017-06-08 LAB — I-STAT CG4 LACTIC ACID, ED: LACTIC ACID, VENOUS: 2.8 mmol/L — AB (ref 0.5–1.9)

## 2017-06-08 LAB — URINALYSIS, ROUTINE W REFLEX MICROSCOPIC
Bilirubin Urine: NEGATIVE
Glucose, UA: NEGATIVE mg/dL
Ketones, ur: 5 mg/dL — AB
Nitrite: NEGATIVE
Protein, ur: NEGATIVE mg/dL
SPECIFIC GRAVITY, URINE: 1.015 (ref 1.005–1.030)
pH: 6 (ref 5.0–8.0)

## 2017-06-08 LAB — AMMONIA: AMMONIA: 9 umol/L (ref 9–35)

## 2017-06-08 LAB — CBG MONITORING, ED: Glucose-Capillary: 130 mg/dL — ABNORMAL HIGH (ref 65–99)

## 2017-06-08 MED ORDER — ENOXAPARIN SODIUM 40 MG/0.4ML ~~LOC~~ SOLN
40.0000 mg | SUBCUTANEOUS | Status: DC
  Administered 2017-06-08: 40 mg via SUBCUTANEOUS
  Filled 2017-06-08: qty 0.4

## 2017-06-08 MED ORDER — ONDANSETRON HCL 4 MG PO TABS
4.0000 mg | ORAL_TABLET | Freq: Four times a day (QID) | ORAL | Status: DC | PRN
Start: 2017-06-08 — End: 2017-06-09

## 2017-06-08 MED ORDER — ONDANSETRON HCL 4 MG/2ML IJ SOLN: 4.0000 mg | Freq: Four times a day (QID) | INTRAMUSCULAR | Status: DC | PRN

## 2017-06-08 MED ORDER — VITAMIN B-1 100 MG PO TABS
100.0000 mg | ORAL_TABLET | Freq: Every day | ORAL | Status: DC
  Administered 2017-06-08: 100 mg via ORAL
  Filled 2017-06-08 (×2): qty 1

## 2017-06-08 MED ORDER — ACETAMINOPHEN 650 MG RE SUPP
650.0000 mg | Freq: Four times a day (QID) | RECTAL | Status: DC | PRN
Start: 2017-06-08 — End: 2017-06-09

## 2017-06-08 MED ORDER — ADULT MULTIVITAMIN W/MINERALS CH
1.0000 | ORAL_TABLET | Freq: Every day | ORAL | Status: DC
  Administered 2017-06-08 – 2017-06-09 (×2): 1 via ORAL
  Filled 2017-06-08 (×2): qty 1

## 2017-06-08 MED ORDER — DEXTROSE 5 % IV SOLN
2.0000 g | Freq: Once | INTRAVENOUS | Status: AC
  Administered 2017-06-08: 2 g via INTRAVENOUS
  Filled 2017-06-08: qty 2

## 2017-06-08 MED ORDER — ESTRADIOL 1 MG PO TABS
0.5000 mg | ORAL_TABLET | Freq: Every day | ORAL | Status: DC
  Filled 2017-06-08: qty 0.5

## 2017-06-08 MED ORDER — ACETAMINOPHEN 325 MG PO TABS: 650.0000 mg | ORAL_TABLET | Freq: Four times a day (QID) | ORAL | Status: DC | PRN

## 2017-06-08 MED ORDER — SODIUM CHLORIDE 0.9 % IV BOLUS (SEPSIS)
1000.0000 mL | Freq: Once | INTRAVENOUS | Status: AC
  Administered 2017-06-08: 1000 mL via INTRAVENOUS

## 2017-06-08 MED ORDER — VITAMIN E 45 MG (100 UNIT) PO CAPS
100.0000 [IU] | ORAL_CAPSULE | Freq: Every day | ORAL | Status: DC
  Filled 2017-06-08: qty 1

## 2017-06-08 MED ORDER — DEXTROSE IN LACTATED RINGERS 5 % IV SOLN
INTRAVENOUS | Status: DC
Start: 2017-06-08 — End: 2017-06-09
  Administered 2017-06-08 – 2017-06-09 (×2): via INTRAVENOUS

## 2017-06-08 MED ORDER — POLYETHYLENE GLYCOL 3350 17 G PO PACK
17.0000 g | PACK | Freq: Every day | ORAL | Status: DC
  Filled 2017-06-08: qty 1

## 2017-06-08 MED ORDER — HYDROCHLOROTHIAZIDE 25 MG PO TABS
25.0000 mg | ORAL_TABLET | Freq: Every day | ORAL | Status: DC
  Administered 2017-06-09: 25 mg via ORAL
  Filled 2017-06-08: qty 1

## 2017-06-08 MED ORDER — OMEGA-3-ACID ETHYL ESTERS 1 G PO CAPS
1.0000 g | ORAL_CAPSULE | Freq: Every day | ORAL | Status: DC
  Administered 2017-06-09: 1 g via ORAL
  Filled 2017-06-08: qty 1

## 2017-06-08 MED ORDER — FOLIC ACID 5 MG/ML IJ SOLN
1.0000 mg | Freq: Every day | INTRAMUSCULAR | Status: DC
  Administered 2017-06-08: 1 mg via INTRAVENOUS
  Filled 2017-06-08 (×3): qty 0.2

## 2017-06-08 MED ORDER — SODIUM CHLORIDE 0.9% FLUSH: 3.0000 mL | INTRAVENOUS | Status: DC | PRN

## 2017-06-08 MED ORDER — LOSARTAN POTASSIUM-HCTZ 100-25 MG PO TABS: 1.0000 | ORAL_TABLET | Freq: Every day | ORAL | Status: DC

## 2017-06-08 MED ORDER — DEXTROSE 5 % IV SOLN
1.0000 g | INTRAVENOUS | Status: DC
  Filled 2017-06-08: qty 10

## 2017-06-08 MED ORDER — SODIUM CHLORIDE 0.9 % IV SOLN: 250.0000 mL | INTRAVENOUS | Status: DC | PRN

## 2017-06-08 MED ORDER — CALCIUM CARBONATE-VITAMIN D 500-200 MG-UNIT PO TABS
1.0000 | ORAL_TABLET | Freq: Two times a day (BID) | ORAL | Status: DC
  Administered 2017-06-09: 1 via ORAL
  Filled 2017-06-08: qty 1

## 2017-06-08 MED ORDER — LOSARTAN POTASSIUM 50 MG PO TABS
100.0000 mg | ORAL_TABLET | Freq: Every day | ORAL | Status: DC
  Administered 2017-06-09: 100 mg via ORAL
  Filled 2017-06-08 (×2): qty 2

## 2017-06-08 MED ORDER — SODIUM CHLORIDE 0.9% FLUSH: 3.0000 mL | Freq: Two times a day (BID) | INTRAVENOUS | Status: DC

## 2017-06-08 MED ORDER — POLYETHYLENE GLYCOL 3350 17 GM/SCOOP PO POWD: 17.0000 g | Freq: Every day | ORAL | Status: DC

## 2017-06-08 MED ORDER — SODIUM CHLORIDE 0.9% FLUSH
3.0000 mL | Freq: Two times a day (BID) | INTRAVENOUS | Status: DC
  Administered 2017-06-08: 3 mL via INTRAVENOUS

## 2017-06-08 MED ORDER — BIOTIN 2.5 MG PO TABS: ORAL_TABLET | Freq: Every day | ORAL | Status: DC

## 2017-06-08 MED ORDER — LORATADINE 10 MG PO TABS: 10.0000 mg | ORAL_TABLET | Freq: Every day | ORAL | Status: DC | PRN

## 2017-06-08 MED ORDER — CITALOPRAM HYDROBROMIDE 20 MG PO TABS
20.0000 mg | ORAL_TABLET | Freq: Every day | ORAL | Status: DC
  Administered 2017-06-09: 20 mg via ORAL
  Filled 2017-06-08: qty 1

## 2017-06-08 NOTE — ED Provider Notes (Addendum)
[facility] DEPT Provider Note   CSN: [id number] Arrival date & time: 06/08/17  1238     History   Chief Complaint Chief Complaint  Patient presents with  . Altered Mental Status    HPI Kimberly Mcgee is a 83 y.o. female with a past medical history of hypertension who presents emergency department via EMS for confusion and altered mental status. According to EMS, the patient was caught walking out of close department store with merchandise. She had not purchased. The staff there found her to be confused and she is not alert and oriented to place or time. They called EMS and brought her to the emergency department. The patient is unable to provide a history. I was able to contact her PCP who states that normally she is alert and oriented and has no history of dementia or confusion. The patient does consent to me calling her son. He states that she normally is not confused and does not have dementia and this is out of character. However, she did have a similar episode a couple years ago when she had an OB/GYN procedure.  HPI  Past Medical History:  Diagnosis Date  . Hypertension     Patient Active Problem List   Diagnosis Date Noted  . Encephalopathy 06/08/2017    Past Surgical History:  Procedure Laterality Date  . ABDOMINAL HYSTERECTOMY    . HEMORROIDECTOMY    . KNEE SURGERY      OB History    No data available       Home Medications    Prior to Admission medications   Medication Sig Start Date End Date Taking? Authorizing Provider  BIOTIN PO Take 1 tablet by mouth daily.    [provider]  Calcium Carbonate-Vitamin D (CALCIUM 600 + D PO) Take 1 tablet by mouth 2 (two) times daily.      [provider]  cephALEXin (KEFLEX) 500 MG capsule Take 1 capsule (500 mg total) by mouth 2 (two) times daily. Patient not taking: Reported on 06/08/2017 09/25/15   Sherwood Gambler, MD  citalopram (CELEXA) 20 MG tablet Take 20 mg by mouth daily.      [provider]  estradiol (ESTRACE) 0.5 MG tablet Take 0.5 mg by mouth daily.      [provider]  loratadine (CLARITIN) 10 MG tablet Take 10 mg by mouth daily as needed for allergies.    [provider]  losartan-hydrochlorothiazide (HYZAAR) 100-25 MG per tablet Take 1 tablet by mouth daily.  09/12/13   [provider]  Multiple Vitamins-Minerals (ONE-A-DAY WOMENS 50 PLUS PO) Take 1 tablet by mouth daily.      [provider]  Omega-3 Fatty Acids (FISH OIL PO) Take 1 tablet by mouth daily.      [provider]  polyethylene glycol powder (GLYCOLAX/MIRALAX) powder Take 17 g by mouth daily.      [provider]  VITAMIN E PO Take 1 tablet by mouth daily.      [provider]    Family History History reviewed. No pertinent family history.  Social History Social History  Substance Use Topics  . Smoking status: Never Smoker  . Smokeless tobacco: Never Used  . Alcohol use No     Allergies   Patient has no known allergies.   Review of Systems Review of Systems  Ten systems reviewed and are negative for acute change, except as noted in the HPI.   Physical Exam Updated Vital Signs BP Marland Kitchen)  156/88   Pulse 66   Temp 97.6 F (36.4 C) (Oral)   Resp 20   SpO2 99%   Physical Exam  Constitutional: She appears well-developed and well-nourished. No distress.  HENT:  Head: Normocephalic and atraumatic.  Eyes: Conjunctivae are normal. No scleral icterus.  Neck: Normal range of motion.  Cardiovascular: Normal rate, regular rhythm and normal heart sounds.  Exam reveals no gallop and no friction rub.   No murmur heard. Pulmonary/Chest: Effort normal and breath sounds normal. No respiratory distress.  Abdominal: Soft. Bowel sounds are normal. She exhibits no distension and no mass. There is no tenderness. There is no guarding.  Neurological: She is alert.  Patient oriented to self. Answers questions appropriately, however she  is amnestic to this morning's events. She is disoriented to time, events and place. +repetetive questioning.  Skin: Skin is warm and dry. She is not diaphoretic.  Psychiatric: Her behavior is normal.  Nursing note and vitals reviewed.    ED Treatments / Results  Labs (all labs ordered are listed, but only abnormal results are displayed) Labs Reviewed  CBC WITH DIFFERENTIAL/PLATELET - Abnormal; Notable for the following:       Result Value   WBC 12.7 (*)    Neutro Abs 9.4 (*)    All other components within normal limits  COMPREHENSIVE METABOLIC PANEL - Abnormal; Notable for the following:    Glucose, Bld 131 (*)    BUN 22 (*)    All other components within normal limits  URINALYSIS, ROUTINE W REFLEX MICROSCOPIC - Abnormal; Notable for the following:    APPearance HAZY (*)    Hgb urine dipstick SMALL (*)    Ketones, ur 5 (*)    Leukocytes, UA LARGE (*)    Bacteria, UA FEW (*)    Squamous Epithelial / LPF 6-30 (*)    All other components within normal limits  CBG MONITORING, ED - Abnormal; Notable for the following:    Glucose-Capillary 130 (*)    All other components within normal limits  I-STAT CG4 LACTIC ACID, ED - Abnormal; Notable for the following:    Lactic Acid, Venous 2.80 (*)    All other components within normal limits  I-STAT CHEM 8, ED - Abnormal; Notable for the following:    Chloride 100 (*)    BUN 23 (*)    Glucose, Bld 130 (*)    All other components within normal limits  AMMONIA    EKG  EKG Interpretation None       Radiology Ct Head Wo Contrast  Result Date: 06/08/2017 CLINICAL DATA:  Altered mental status. EXAM: CT HEAD WITHOUT CONTRAST TECHNIQUE: Contiguous axial images were obtained from the base of the skull through the vertex without intravenous contrast. COMPARISON:  Head CT dated 09/25/2015. FINDINGS: Brain: Mild generalized age related parenchymal atrophy with commensurate dilatation of the ventricles and sulci. Minimal chronic small vessel  ischemic changes within the deep periventricular white matter. There is no mass, hemorrhage, edema or other evidence of acute parenchymal abnormality. No extra-axial hemorrhage. Vascular: There are chronic calcified atherosclerotic changes of the large vessels at the skull base. No unexpected hyperdense vessel. 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 Skull: Normal. Negative for fracture or focal lesion. Sinuses/Orbits: No acute finding. Other: None. IMPRESSION: 1. No acute findings.  No intracranial mass, hemorrhage or edema. 2. Mild chronic small vessel ischemic changes in the periventricular white matter. 3. Atherosclerosis. Electronically Signed   By: Roxy Horseman.D.  On: 06/08/2017 14:17    Procedures Procedures (including critical care time)  Medications Ordered in ED Medications  sodium chloride 0.9 % bolus 1,000 mL (1,000 mLs Intravenous New Bag/Given 06/08/17 1505)  cefTRIAXone (ROCEPHIN) 2 g in dextrose 5 % 50 mL IVPB (2 g Intravenous New Bag/Given 06/08/17 1512)     Initial Impression / Assessment and Plan / ED Course  I have reviewed the triage vital signs and the nursing notes.  Pertinent labs & imaging results that were available during my care of the patient were reviewed by me and considered in my medical decision making (see chart for details).    Hopefully female with confusion, urinary tract infection. Lactic acid is slightly elevated. She does have an elevated white blood cell count, but she is afebrile, hemodynamically stable. She has no neurologic deficits. Blood cultures were drawn and the patient was treated with IV ceftriaxone. She'll be admitted to the hospitalist service. Repeat lactic acid is ordered.  Final Clinical Impressions(s) / ED Diagnoses   Final diagnoses:  Altered mental status, unspecified altered mental status type  Lower urinary tract infectious disease    New Prescriptions New Prescriptions   No medications on file     Margarita Mail, PA-C 06/08/17 1610    Lajean Saver, MD 06/09/17 [place], [place], PA-C 06/19/17 2330    Lajean Saver, MD 06/22/17 [phone]

## 2017-06-08 NOTE — Progress Notes (Signed)
PHARMACIST - PHYSICIAN ORDER COMMUNICATION  CONCERNING: P&T Medication Policy on Herbal Medications  DESCRIPTION:  This patient's order for:  Biotin  has been noted.  This product(s) is classified as an "herbal" or natural product. Due to a lack of definitive safety studies or FDA approval, nonstandard manufacturing practices, plus the potential risk of unknown drug-drug interactions while on inpatient medications, the Pharmacy and Therapeutics Committee does not permit the use of "herbal" or natural products of this type within [facility].   ACTION TAKEN: The pharmacy department is unable to verify this order at this time and your patient has been informed of this safety policy. Please reevaluate patient's clinical condition at discharge and address if the herbal or natural product(s) should be resumed at that time.  Myah Guynes, PharmD 

## 2017-06-08 NOTE — ED Triage Notes (Signed)
Per EMS-states she was caught shop lifting at [employer] when they were questioning her she stated she couldn't remember the date and situation-patient passed stroke scale with EMS-

## 2017-06-08 NOTE — ED Notes (Signed)
Pt ambulatory to restroom

## 2017-06-08 NOTE — ED Notes (Signed)
Bed: [facility] Expected date:  Expected time:  Means of arrival:  Comments: EMS-confusion

## 2017-06-08 NOTE — ED Notes (Signed)
Patient transported to X-ray 

## 2017-06-08 NOTE — H&P (Addendum)
History and Physical    CHERRON BLITZER BHA:[id number] DOB: Apr 02, 1925 DOA: 06/08/2017  PCP: Shon Baton, MD    Patient coming from: Home   Chief Complaint: Altered mental status.   HPI: Kimberly Mcgee is a 83 y.o. female with medical history significant of hypertension, presents with the chief complain of altered mental status. Patient has been at her usual state of health until this morning, she went to a local store, where she was confused and disoriented, severe enough that EMS was called and patient was brought into the hospital for further evaluation. Patient recalls going into the store but not remembers how she got into the hospital.  Apparently the episode was brief in duration, no associated symptoms, no improving or worsening factors. In 2016 she had an emergency room evaluation due to transient confusion after a pessary removal. On that occasion she was diagnosed with a urinary tract infection, and she was treated with antibiotics.  ED Course: Patient was found awake and alert, her son at the bedside mention that patient is very close to her baseline only mild confusion present. Patient was diagnosed with urinary tract infection, received IV fluids and IV antibiotics, call for further admission and evaluation.   Review of Systems:  1. Gen. no fevers or chills  2. ENT no runny nose or sore throat  3. Pulmonary no shortness of breath, cough or hemoptysis  4. Cardiovascular no angina, no claudication, no PND orthopnea  5. Skeletal no joint pain  6. Gastrointestinal no nausea, vomiting or diarrhea  7. Hematology no easy bruisability or frequent infections  8. Dermatology no rashes  9. Endocrine a tremors, heat or cold intolerance  10. Urology, no dysuria, increased frequency or polyuria   Past Medical History:  Diagnosis Date  . Hypertension     Past Surgical History:  Procedure Laterality Date  . ABDOMINAL HYSTERECTOMY    . HEMORROIDECTOMY    . KNEE SURGERY       reports that she has never smoked. She does not have any smokeless tobacco history on file. She reports that she does not drink alcohol or use drugs.  No Known Allergies  No family history on file. , family history personally reviewed, found to be nonpertinent   Prior to Admission medications   Medication Sig Start Date End Date Taking? Authorizing Provider  BIOTIN PO Take 1 tablet by mouth daily.    [provider]  Calcium Carbonate-Vitamin D (CALCIUM 600 + D PO) Take 1 tablet by mouth 2 (two) times daily.      [provider]  cephALEXin (KEFLEX) 500 MG capsule Take 1 capsule (500 mg total) by mouth 2 (two) times daily. Patient not taking: Reported on 06/08/2017 09/25/15   Sherwood Gambler, MD  citalopram (CELEXA) 20 MG tablet Take 20 mg by mouth daily.      [provider]  estradiol (ESTRACE) 0.5 MG tablet Take 0.5 mg by mouth daily.      [provider]  loratadine (CLARITIN) 10 MG tablet Take 10 mg by mouth daily as needed for allergies.    [provider]  losartan-hydrochlorothiazide (HYZAAR) 100-25 MG per tablet Take 1 tablet by mouth daily.  09/12/13   [provider]  Multiple Vitamins-Minerals (ONE-A-DAY WOMENS 50 PLUS PO) Take 1 tablet by mouth daily.      [provider]  Omega-3 Fatty Acids (FISH OIL PO) Take 1 tablet by mouth daily.      [provider]  polyethylene  glycol powder (GLYCOLAX/MIRALAX) powder Take 17 g by mouth daily.      [provider]  VITAMIN E PO Take 1 tablet by mouth daily.      [provider]    Physical Exam: Vitals:   06/08/17 1249 06/08/17 1500  BP: (!) 175/85 (!) 156/88  Pulse: 71 66  Resp: 15 20  Temp: 97.6 F (36.4 C)   TempSrc: Oral   SpO2: 96% 99%    Constitutional: Not in pain or dyspnea Vitals:   06/08/17 1249 06/08/17 1500  BP: (!) 175/85 (!) 156/88  Pulse: 71 66  Resp: 15 20  Temp: 97.6 F (36.4 C)   TempSrc: Oral   SpO2: 96% 99%    Eyes: PERRL, lids and conjunctivae mild pale.  Head normocephalic, nose and ears no deformities ENMT: Mucous membranes are dry. Posterior pharynx clear of any exudate or lesions.Normal dentition.  Neck: normal, supple, no masses, no thyromegaly Respiratory: clear to auscultation bilaterally, no wheezing, no crackles. Normal respiratory effort. No accessory muscle use.  Cardiovascular: Regular rate and rhythm, no murmurs / rubs / gallops. No extremity edema. 2+ pedal pulses. No carotid bruits.  Abdomen: no tenderness, no masses palpated. No hepatosplenomegaly. Bowel sounds positive.  Musculoskeletal: no clubbing / cyanosis. No joint deformity upper and lower extremities. Good ROM, no contractures. Normal muscle tone.  Skin: no rashes, lesions, ulcers. No induration Neurologic: CN 2-12 grossly intact. Sensation intact, DTR normal. Strength 5/5 in all 4.    Labs on Admission: I have personally reviewed following labs and imaging studies  CBC:  Recent Labs Lab 06/08/17 1440 06/08/17 1445  WBC 12.7*  --   NEUTROABS 9.4*  --   HGB 13.8 13.9  HCT 41.3 41.0  MCV 89.2  --   PLT 269  --    Basic Metabolic Panel:  Recent Labs Lab 06/08/17 1440 06/08/17 1445  NA 138 139  K 3.6 3.6  CL 103 100*  CO2 24  --   GLUCOSE 131* 130*  BUN 22* 23*  CREATININE 0.83 0.80  CALCIUM 9.9  --    GFR: CrCl cannot be calculated (Unknown ideal weight.). Liver Function Tests:  Recent Labs Lab 06/08/17 1440  AST 24  ALT 17  ALKPHOS 44  BILITOT 0.5  PROT 7.6  ALBUMIN 4.1   No results for input(s): LIPASE, AMYLASE in the last 168 hours.  Recent Labs Lab 06/08/17 1440  AMMONIA 9   Coagulation Profile: No results for input(s): INR, PROTIME in the last 168 hours. Cardiac Enzymes: No results for input(s): CKTOTAL, CKMB, CKMBINDEX, TROPONINI in the last 168 hours. BNP (last 3 results) No results for input(s): PROBNP in the last 8760 hours. HbA1C: No results for input(s): HGBA1C in  the last 72 hours. CBG:  Recent Labs Lab 06/08/17 1440  GLUCAP 130*   Lipid Profile: No results for input(s): CHOL, HDL, LDLCALC, TRIG, CHOLHDL, LDLDIRECT in the last 72 hours. Thyroid Function Tests: No results for input(s): TSH, T4TOTAL, FREET4, T3FREE, THYROIDAB in the last 72 hours. Anemia Panel: No results for input(s): VITAMINB12, FOLATE, FERRITIN, TIBC, IRON, RETICCTPCT in the last 72 hours. Urine analysis:    Component Value Date/Time   COLORURINE YELLOW 06/08/2017 1339   APPEARANCEUR HAZY (A) 06/08/2017 1339   LABSPEC 1.015 06/08/2017 1339   PHURINE 6.0 06/08/2017 1339   GLUCOSEU NEGATIVE 06/08/2017 1339   HGBUR SMALL (A) 06/08/2017 1339   BILIRUBINUR NEGATIVE 06/08/2017 1339   KETONESUR 5 (A) 06/08/2017 1339   PROTEINUR NEGATIVE  06/08/2017 1339   UROBILINOGEN 0.2 09/25/2015 1211   NITRITE NEGATIVE 06/08/2017 1339   LEUKOCYTESUR LARGE (A) 06/08/2017 1339    Radiological Exams on Admission: Ct Head Wo Contrast  Result Date: 06/08/2017 CLINICAL DATA:  Altered mental status. EXAM: CT HEAD WITHOUT CONTRAST TECHNIQUE: Contiguous axial images were obtained from the base of the skull through the vertex without intravenous contrast. COMPARISON:  Head CT dated 09/25/2015. FINDINGS: Brain: Mild generalized age related parenchymal atrophy with commensurate dilatation of the ventricles and sulci. Minimal chronic small vessel ischemic changes within the deep periventricular white matter. There is no mass, hemorrhage, edema or other evidence of acute parenchymal abnormality. No extra-axial hemorrhage. Vascular: There are chronic calcified atherosclerotic changes of the large vessels at the skull base. No unexpected hyperdense vessel. 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 Skull: Normal. Negative for fracture or focal lesion. Sinuses/Orbits: No acute finding. Other: None. IMPRESSION: 1. No acute findings.  No intracranial mass, hemorrhage or edema. 2. Mild chronic small  vessel ischemic changes in the periventricular white matter. 3. Atherosclerosis. Electronically Signed   By: Franki Cabot M.D.   On: 06/08/2017 14:17    EKG: Independently reviewed. Normal sinus rhythm, normal intervals, premature atrial complex.  Assessment/Plan Active Problems:   * No active hospital problems. *  This is an 83 year old female with no significant past medical history presents to the hospital after a brief episode of confusion, no other associated symptoms or focal neurologic deficits. On the initial physical examination blood pressure 156/88, heart rate 66, respiratory 20, oxygen saturation 99%. Oral mucosae is dry, her lungs were clear to auscultation bilaterally, heart S1-S2 present and rhythmic, her abdomen soft nontender, no lower extremity edema, neurologically nonfocal. Sodium 139, potassium 3.6, chloride 100, bicarbonate 24, glucose 130, BUN 23, creatinine 0.80, lactic acid 2.8, white count 12.7, hemoglobin 13.8, hematocrit 41.3, platelets 269, urinalysis with 6-30 white cells, 6-36, cells, negative proteins, large leukocyte esterase, head CT with no acute intracranial abnormalities.  The patient will be admitted to the medical unit with a working diagnosis of transient metabolic encephalopathy due to a urinary tract infection.  1. Metabolic encephalopathy. Patient will be admitted to the medical ward with a remote telemetry monitor, continue neuro checks every 4 hours, hydration with dextrose/ LR, physical therapy evaluation. Will add thiamine, folic acid and multivitamins.  2. Urinary tract infection. Will follow-up on urine culture and blood cultures, patient will be treated with intravenous ceftriaxone. Follow temperature curve and cell count.  3. Hypertension. Continue losartan and hydrochlorothiazide, blood pressure systolic is 419, on admission.   4. Depression. Continue citalopram.     DVT prophylaxis: enoxaparin  Code Status: Full  Family  Communication: Disposition Plan: Home   Consults called:  Admission status: Inpatient   Mauricio Gerome Apley MD [facility] Pager [phone]  If 7PM-7AM, please contact night-coverage [email or web address] Password [facility]  06/08/2017, 4:04 PM

## 2017-06-08 NOTE — ED Notes (Signed)
Bed: [facility]10 Expected date:  Expected time:  Means of arrival:  Comments: Hall C 

## 2017-06-09 DIAGNOSIS — I10 Essential (primary) hypertension: Secondary | ICD-10-CM | POA: Diagnosis not present

## 2017-06-09 DIAGNOSIS — N390 Urinary tract infection, site not specified: Secondary | ICD-10-CM | POA: Diagnosis not present

## 2017-06-09 DIAGNOSIS — R4182 Altered mental status, unspecified: Secondary | ICD-10-CM | POA: Diagnosis not present

## 2017-06-09 DIAGNOSIS — G9340 Encephalopathy, unspecified: Secondary | ICD-10-CM | POA: Diagnosis not present

## 2017-06-09 DIAGNOSIS — G9341 Metabolic encephalopathy: Secondary | ICD-10-CM | POA: Diagnosis not present

## 2017-06-09 LAB — CBC
HCT: 37.2 % (ref 36.0–46.0)
Hemoglobin: 12.2 g/dL (ref 12.0–15.0)
MCH: 29.5 pg (ref 26.0–34.0)
MCHC: 32.8 g/dL (ref 30.0–36.0)
MCV: 90.1 fL (ref 78.0–100.0)
PLATELETS: 258 10*3/uL (ref 150–400)
RBC: 4.13 MIL/uL (ref 3.87–5.11)
RDW: 14.9 % (ref 11.5–15.5)
WBC: 8.2 10*3/uL (ref 4.0–10.5)

## 2017-06-09 LAB — COMPREHENSIVE METABOLIC PANEL
ALBUMIN: 3.4 g/dL — AB (ref 3.5–5.0)
ALT: 13 U/L — ABNORMAL LOW (ref 14–54)
ANION GAP: 9 (ref 5–15)
AST: 22 U/L (ref 15–41)
Alkaline Phosphatase: 35 U/L — ABNORMAL LOW (ref 38–126)
BUN: 17 mg/dL (ref 6–20)
CHLORIDE: 103 mmol/L (ref 101–111)
CO2: 27 mmol/L (ref 22–32)
Calcium: 9 mg/dL (ref 8.9–10.3)
Creatinine, Ser: 0.88 mg/dL (ref 0.44–1.00)
GFR calc Af Amer: >60 mL/min (ref >60)
GFR calc non Af Amer: 57 mL/min — ABNORMAL LOW (ref >60)
GLUCOSE: 156 mg/dL — AB (ref 65–99)
POTASSIUM: 3.5 mmol/L (ref 3.5–5.1)
SODIUM: 139 mmol/L (ref 135–145)
Total Bilirubin: 0.7 mg/dL (ref 0.3–1.2)
Total Protein: 6.7 g/dL (ref 6.5–8.1)

## 2017-06-09 MED ORDER — FOLIC ACID 1 MG PO TABS: 1.0000 mg | ORAL_TABLET | Freq: Every day | ORAL | Status: DC

## 2017-06-09 MED ORDER — CIPROFLOXACIN HCL 250 MG PO TABS
250.0000 mg | ORAL_TABLET | Freq: Two times a day (BID) | ORAL | 0 refills | Status: AC
Start: 2017-06-09 — End: 2017-06-16

## 2017-06-09 MED ORDER — CIPROFLOXACIN HCL 250 MG PO TABS: 250.0000 mg | ORAL_TABLET | Freq: Two times a day (BID) | ORAL | Status: DC

## 2017-06-09 NOTE — Progress Notes (Signed)
PT Cancellation Note / Screen  Patient Details Name: Kimberly Mcgee MRN: [id number] DOB: [birth year]   Cancelled Treatment:    Reason Eval/Treat Not Completed: PT screened, no needs identified, will sign off Pt and daughter reports pt's cognition and mobility is now at baseline.  Pt and daughter deny any PT needs or follow up at this time.  PT to sign off.   Jareli Highland,KATHrine E 06/09/2017, 11:05 AM Carmelia Bake, PT, DPT 06/09/2017 Pager: [phone]

## 2017-06-09 NOTE — Progress Notes (Signed)
Pt discharge to home, instruction reviewed. SRP,RN

## 2017-06-09 NOTE — Discharge Summary (Signed)
Physician Discharge Summary  Kimberly Mcgee KGY:[id number] DOB: 1925-09-01 DOA: 06/08/2017  PCP: Shon Baton, MD  Admit date: 06/08/2017 Discharge date: 06/09/2017  Admitted From: Home  Disposition:  Home   Recommendations for Outpatient Follow-up:  1. Follow up with PCP in 1 weeks 2. Patient has been placed on ciprofloxacin for the next 7 days 3. Patient may need closer surveillance for urine tract infections, suspected anatomic changes in her urinary tract related to her suspect cystocele.  4. Patient was advised not to drive until she follows up with her primary care physician.  Home Health: No  Equipment/Devices: No   Discharge Condition: Stable  CODE STATUS: Full  Diet recommendation: Regular  Brief/Interim Summary: This is an 83 year old female with no significant past medical history presents to the hospital after a brief episode of confusion, no other associated symptoms or focal neurologic deficits. On the initial physical examination blood pressure 156/88, heart rate 66, respiratory 20, oxygen saturation 99%. Oral mucosae was dry, her lungs were clear to auscultation bilaterally, heart S1-S2 present and rhythmic, her abdomen soft nontender, no lower extremity edema, neurologically nonfocal. Sodium 139, potassium 3.6, chloride 100, bicarbonate 24, glucose 130, BUN 23, creatinine 0.80, lactic acid 2.8, white count 12.7, hemoglobin 13.8, hematocrit 41.3, platelets 269, urinalysis with 6-30 white cells, 6-30 rbc, negative proteins, large leukocyte esterase, head CT with no acute intracranial abnormalities.  The patient will be admitted to the medical unit with a working diagnosis of transient metabolic encephalopathy due to a urinary tract infection.  1. Metabolic encephalopathy. Patient was admitted to the medical floor with telemetry monitor, she received IV fluids and IV antibiotics, her mentation significantly improved, and by the time of discharge it was back to her baseline.  Patient has been advised to continue antibiotics for the next 7 days, avoid driving until seen by her primary care physician. Physical therapy was consulted, no needs were identified.  2. Urinary tract infection, present on admission. Patient was placed on IV ceftriaxone, cultures have been no growth, follow-up white cell count down to 8.2. Patient will be discharged on oral ciprofloxacin for the next 7 days. It is suspected that her urinary tract anatomy is altered due to cystocele, patient may require active surveillance for recurrent urinary tract infections. She follows with urology as an outpatient. Her pessary has been removed in the past.   3. Hypertension. Patient will continue losartan and hydrochlorothiazide. Her blood pressure remained well-controlled in hospital, systolic blood blood pressure 120 to 150 mmHg.   4. Depression. Patient was continued on citalopram.  Discharge Diagnoses:  Active Problems:   Encephalopathy    Discharge Instructions   Allergies as of 06/09/2017   No Known Allergies     Medication List    STOP taking these medications   cephALEXin 500 MG capsule Commonly known as:  KEFLEX     TAKE these medications   aspirin EC 81 MG tablet Take 81 mg by mouth every other day.   CALCIUM 600 + D PO Take 1 tablet by mouth 2 (two) times daily.   ciprofloxacin 250 MG tablet Commonly known as:  CIPRO Take 1 tablet (250 mg total) by mouth 2 (two) times daily.   citalopram 20 MG tablet Commonly known as:  CELEXA Take 20 mg by mouth daily.   FISH OIL PO Take 1 tablet by mouth daily.   losartan-hydrochlorothiazide 100-25 MG tablet Commonly known as:  HYZAAR Take 1 tablet by mouth daily.   ONE-A-DAY WOMENS 50 PLUS PO  Take 1 tablet by mouth daily.   polyethylene glycol powder powder Commonly known as:  GLYCOLAX/MIRALAX Take 17 g by mouth daily.       No Known Allergies  Consultations:     Procedures/Studies: Ct Head Wo Contrast  Result  Date: 06/08/2017 CLINICAL DATA:  Altered mental status. EXAM: CT HEAD WITHOUT CONTRAST TECHNIQUE: Contiguous axial images were obtained from the base of the skull through the vertex without intravenous contrast. COMPARISON:  Head CT dated 09/25/2015. FINDINGS: Brain: Mild generalized age related parenchymal atrophy with commensurate dilatation of the ventricles and sulci. Minimal chronic small vessel ischemic changes within the deep periventricular white matter. There is no mass, hemorrhage, edema or other evidence of acute parenchymal abnormality. No extra-axial hemorrhage. Vascular: There are chronic calcified atherosclerotic changes of the large vessels at the skull base. No unexpected hyperdense vessel. 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 Skull: Normal. Negative for fracture or focal lesion. Sinuses/Orbits: No acute finding. Other: None. IMPRESSION: 1. No acute findings.  No intracranial mass, hemorrhage or edema. 2. Mild chronic small vessel ischemic changes in the periventricular white matter. 3. Atherosclerosis. Electronically Signed   By: Franki Cabot M.D.   On: 06/08/2017 14:17       Subjective: Patient feeling well, no nausea or vomiting, no further confusion or agitation. Patient's daughter at the bedside and all questions were addressed.   Discharge Exam: Vitals:   06/08/17 2358 06/09/17 0531  BP: (!) 127/56 (!) 156/85  Pulse: (!) 56 64  Resp: 17 16  Temp: 98.1 F (36.7 C) 98.2 F (36.8 C)   Vitals:   06/08/17 1800 06/08/17 1842 06/08/17 2358 06/09/17 0531  BP:  (!) 154/65 (!) 127/56 (!) 156/85  Pulse:  60 (!) 56 64  Resp:  18 17 16   Temp:  98.3 F (36.8 C) 98.1 F (36.7 C) 98.2 F (36.8 C)  TempSrc:  Oral Oral Oral  SpO2:  98% 98% 97%  Weight: 77.7 kg (171 lb 4.8 oz)     Height: 5\' 2"  (1.575 m)       General: Pt is alert, awake, not in acute distress E ENT: no pallor or icterus, oral mucosa moist.  Cardiovascular: RRR, S1/S2 +, no rubs, no  gallops Respiratory: CTA bilaterally, no wheezing, no rhonchi Abdominal: Soft, NT, ND, bowel sounds + Extremities: no edema, no cyanosis Neurologically: non focal.    The results of significant diagnostics from this hospitalization (including imaging, microbiology, ancillary and laboratory) are listed below for reference.     Microbiology: No results found for this or any previous visit (from the past 240 hour(s)).   Labs: BNP (last 3 results) No results for input(s): BNP in the last 8760 hours. Basic Metabolic Panel:  Recent Labs Lab 06/08/17 1440 06/08/17 1445 06/09/17 0536  NA 138 139 139  K 3.6 3.6 3.5  CL 103 100* 103  CO2 24  --  27  GLUCOSE 131* 130* 156*  BUN 22* 23* 17  CREATININE 0.83 0.80 0.88  CALCIUM 9.9  --  9.0   Liver Function Tests:  Recent Labs Lab 06/08/17 1440 06/09/17 0536  AST 24 22  ALT 17 13*  ALKPHOS 44 35*  BILITOT 0.5 0.7  PROT 7.6 6.7  ALBUMIN 4.1 3.4*   No results for input(s): LIPASE, AMYLASE in the last 168 hours.  Recent Labs Lab 06/08/17 1440  AMMONIA 9   CBC:  Recent Labs Lab 06/08/17 1440 06/08/17 1445 06/09/17 0536  WBC 12.7*  --  8.2  NEUTROABS 9.4*  --   --   HGB 13.8 13.9 12.2  HCT 41.3 41.0 37.2  MCV 89.2  --  90.1  PLT 269  --  258   Cardiac Enzymes: No results for input(s): CKTOTAL, CKMB, CKMBINDEX, TROPONINI in the last 168 hours. BNP: Invalid input(s): POCBNP CBG:  Recent Labs Lab 06/08/17 1440  GLUCAP 130*   D-Dimer No results for input(s): DDIMER in the last 72 hours. Hgb A1c No results for input(s): HGBA1C in the last 72 hours. Lipid Profile No results for input(s): CHOL, HDL, LDLCALC, TRIG, CHOLHDL, LDLDIRECT in the last 72 hours. Thyroid function studies No results for input(s): TSH, T4TOTAL, T3FREE, THYROIDAB in the last 72 hours.  Invalid input(s): FREET3 Anemia work up No results for input(s): VITAMINB12, FOLATE, FERRITIN, TIBC, IRON, RETICCTPCT in the last 72  hours. Urinalysis    Component Value Date/Time   COLORURINE YELLOW 06/08/2017 1339   APPEARANCEUR HAZY (A) 06/08/2017 1339   LABSPEC 1.015 06/08/2017 1339   PHURINE 6.0 06/08/2017 1339   GLUCOSEU NEGATIVE 06/08/2017 1339   HGBUR SMALL (A) 06/08/2017 1339   BILIRUBINUR NEGATIVE 06/08/2017 1339   KETONESUR 5 (A) 06/08/2017 1339   PROTEINUR NEGATIVE 06/08/2017 1339   UROBILINOGEN 0.2 09/25/2015 1211   NITRITE NEGATIVE 06/08/2017 1339   LEUKOCYTESUR LARGE (A) 06/08/2017 1339   Sepsis Labs Invalid input(s): PROCALCITONIN,  WBC,  LACTICIDVEN Microbiology No results found for this or any previous visit (from the past 240 hour(s)).   Time coordinating discharge: Over 45 minutes  SIGNED:   Tawni Millers, MD  [facility] 06/09/2017, 11:06 AM Pager   If 7PM-7AM, please contact night-coverage [email or web address] Password [email or web address]

## 2017-06-09 NOTE — Progress Notes (Signed)
PHARMACIST - PHYSICIAN COMMUNICATION  DR:   Cathlean Sauer  CONCERNING: IV to Oral Route Change Policy  RECOMMENDATION: This patient is receiving folic acid by the intravenous route.  Based on criteria approved by the Pharmacy and Therapeutics Committee, the intravenous medication(s) is/are being converted to the equivalent oral dose form(s).   DESCRIPTION: These criteria include:  The patient is eating (either orally or via tube) and/or has been taking other orally administered medications for a least 24 hours  The patient has no evidence of active gastrointestinal bleeding or impaired GI absorption (gastrectomy, short bowel, patient on TNA or NPO).  If you have questions about this conversion, please contact the Pharmacy Department  []   [phone] )  [facility] []   [phone] )  [facility] []   [phone] )  [facility] []   [phone] )  [facility] [x]   [phone] )  [place], [facility] 06/09/2017 10:43 AM

## 2017-06-10 LAB — URINE CULTURE

## 2017-06-13 LAB — CULTURE, BLOOD (ROUTINE X 2)
CULTURE: NO GROWTH
Culture: NO GROWTH
SPECIAL REQUESTS: ADEQUATE
Special Requests: ADEQUATE

## 2017-06-18 DIAGNOSIS — H26491 Other secondary cataract, right eye: Secondary | ICD-10-CM | POA: Diagnosis not present

## 2017-06-18 DIAGNOSIS — H04123 Dry eye syndrome of bilateral lacrimal glands: Secondary | ICD-10-CM | POA: Diagnosis not present

## 2017-06-18 DIAGNOSIS — Z961 Presence of intraocular lens: Secondary | ICD-10-CM | POA: Diagnosis not present

## 2017-06-18 DIAGNOSIS — H524 Presbyopia: Secondary | ICD-10-CM | POA: Diagnosis not present

## 2017-06-19 DIAGNOSIS — N390 Urinary tract infection, site not specified: Secondary | ICD-10-CM | POA: Diagnosis not present

## 2017-07-08 DIAGNOSIS — R410 Disorientation, unspecified: Secondary | ICD-10-CM | POA: Diagnosis not present

## 2017-07-08 DIAGNOSIS — N390 Urinary tract infection, site not specified: Secondary | ICD-10-CM | POA: Diagnosis not present

## 2017-07-08 DIAGNOSIS — Z6833 Body mass index (BMI) 33.0-33.9, adult: Secondary | ICD-10-CM | POA: Diagnosis not present

## 2017-07-08 DIAGNOSIS — I10 Essential (primary) hypertension: Secondary | ICD-10-CM | POA: Diagnosis not present

## 2017-10-08 DIAGNOSIS — I700 Atherosclerosis of aorta: Secondary | ICD-10-CM | POA: Diagnosis not present

## 2017-10-08 DIAGNOSIS — E668 Other obesity: Secondary | ICD-10-CM | POA: Diagnosis not present

## 2017-10-08 DIAGNOSIS — Z6832 Body mass index (BMI) 32.0-32.9, adult: Secondary | ICD-10-CM | POA: Diagnosis not present

## 2017-10-08 DIAGNOSIS — Z23 Encounter for immunization: Secondary | ICD-10-CM | POA: Diagnosis not present

## 2017-10-08 DIAGNOSIS — N183 Chronic kidney disease, stage 3 (moderate): Secondary | ICD-10-CM | POA: Diagnosis not present

## 2017-10-08 DIAGNOSIS — F411 Generalized anxiety disorder: Secondary | ICD-10-CM | POA: Diagnosis not present

## 2017-10-08 DIAGNOSIS — E1122 Type 2 diabetes mellitus with diabetic chronic kidney disease: Secondary | ICD-10-CM | POA: Diagnosis not present

## 2017-10-08 DIAGNOSIS — I129 Hypertensive chronic kidney disease with stage 1 through stage 4 chronic kidney disease, or unspecified chronic kidney disease: Secondary | ICD-10-CM | POA: Diagnosis not present

## 2017-10-08 DIAGNOSIS — N3289 Other specified disorders of bladder: Secondary | ICD-10-CM | POA: Diagnosis not present

## 2017-10-21 DIAGNOSIS — L570 Actinic keratosis: Secondary | ICD-10-CM | POA: Diagnosis not present

## 2017-10-21 DIAGNOSIS — L821 Other seborrheic keratosis: Secondary | ICD-10-CM | POA: Diagnosis not present

## 2017-10-21 DIAGNOSIS — L304 Erythema intertrigo: Secondary | ICD-10-CM | POA: Diagnosis not present

## 2017-10-21 DIAGNOSIS — B078 Other viral warts: Secondary | ICD-10-CM | POA: Diagnosis not present

## 2017-10-22 DIAGNOSIS — I10 Essential (primary) hypertension: Secondary | ICD-10-CM | POA: Diagnosis not present

## 2017-10-22 DIAGNOSIS — E559 Vitamin D deficiency, unspecified: Secondary | ICD-10-CM | POA: Diagnosis not present

## 2018-03-18 DIAGNOSIS — L814 Other melanin hyperpigmentation: Secondary | ICD-10-CM | POA: Diagnosis not present

## 2018-03-18 DIAGNOSIS — L570 Actinic keratosis: Secondary | ICD-10-CM | POA: Diagnosis not present

## 2018-03-18 DIAGNOSIS — D225 Melanocytic nevi of trunk: Secondary | ICD-10-CM | POA: Diagnosis not present

## 2018-03-18 DIAGNOSIS — L218 Other seborrheic dermatitis: Secondary | ICD-10-CM | POA: Diagnosis not present

## 2018-03-18 DIAGNOSIS — L821 Other seborrheic keratosis: Secondary | ICD-10-CM | POA: Diagnosis not present

## 2018-03-18 DIAGNOSIS — D1801 Hemangioma of skin and subcutaneous tissue: Secondary | ICD-10-CM | POA: Diagnosis not present

## 2018-04-02 DIAGNOSIS — N183 Chronic kidney disease, stage 3 (moderate): Secondary | ICD-10-CM | POA: Diagnosis not present

## 2018-04-02 DIAGNOSIS — E1122 Type 2 diabetes mellitus with diabetic chronic kidney disease: Secondary | ICD-10-CM | POA: Diagnosis not present

## 2018-04-02 DIAGNOSIS — M859 Disorder of bone density and structure, unspecified: Secondary | ICD-10-CM | POA: Diagnosis not present

## 2018-04-02 DIAGNOSIS — I10 Essential (primary) hypertension: Secondary | ICD-10-CM | POA: Diagnosis not present

## 2018-04-02 DIAGNOSIS — R82998 Other abnormal findings in urine: Secondary | ICD-10-CM | POA: Diagnosis not present

## 2018-04-09 DIAGNOSIS — N329 Bladder disorder, unspecified: Secondary | ICD-10-CM | POA: Diagnosis not present

## 2018-04-09 DIAGNOSIS — Z1389 Encounter for screening for other disorder: Secondary | ICD-10-CM | POA: Diagnosis not present

## 2018-04-09 DIAGNOSIS — Z6833 Body mass index (BMI) 33.0-33.9, adult: Secondary | ICD-10-CM | POA: Diagnosis not present

## 2018-04-09 DIAGNOSIS — N183 Chronic kidney disease, stage 3 (moderate): Secondary | ICD-10-CM | POA: Diagnosis not present

## 2018-04-09 DIAGNOSIS — E1122 Type 2 diabetes mellitus with diabetic chronic kidney disease: Secondary | ICD-10-CM | POA: Diagnosis not present

## 2018-04-09 DIAGNOSIS — Z23 Encounter for immunization: Secondary | ICD-10-CM | POA: Diagnosis not present

## 2018-04-09 DIAGNOSIS — I700 Atherosclerosis of aorta: Secondary | ICD-10-CM | POA: Diagnosis not present

## 2018-04-09 DIAGNOSIS — K5900 Constipation, unspecified: Secondary | ICD-10-CM | POA: Diagnosis not present

## 2018-04-09 DIAGNOSIS — M25561 Pain in right knee: Secondary | ICD-10-CM | POA: Diagnosis not present

## 2018-04-09 DIAGNOSIS — Z0000 Encounter for general adult medical examination without abnormal findings: Secondary | ICD-10-CM | POA: Diagnosis not present

## 2018-04-09 DIAGNOSIS — I129 Hypertensive chronic kidney disease with stage 1 through stage 4 chronic kidney disease, or unspecified chronic kidney disease: Secondary | ICD-10-CM | POA: Diagnosis not present

## 2018-04-09 DIAGNOSIS — E669 Obesity, unspecified: Secondary | ICD-10-CM | POA: Diagnosis not present

## 2018-04-11 ENCOUNTER — Encounter (HOSPITAL_COMMUNITY): Payer: Self-pay | Admitting: *Deleted

## 2018-04-11 ENCOUNTER — Emergency Department (HOSPITAL_COMMUNITY)
Admission: EM | Admit: 2018-04-11 | Discharge: 2018-04-11 | Disposition: A | Discharge status: Home / Self Care | Payer: PPO | Attending: Emergency Medicine | Admitting: Emergency Medicine

## 2018-04-11 ENCOUNTER — Other Ambulatory Visit: Payer: Self-pay

## 2018-04-11 ENCOUNTER — Emergency Department (HOSPITAL_COMMUNITY): Payer: PPO

## 2018-04-11 DIAGNOSIS — N3001 Acute cystitis with hematuria: Secondary | ICD-10-CM | POA: Insufficient documentation

## 2018-04-11 DIAGNOSIS — R1030 Lower abdominal pain, unspecified: Secondary | ICD-10-CM

## 2018-04-11 DIAGNOSIS — K8020 Calculus of gallbladder without cholecystitis without obstruction: Secondary | ICD-10-CM | POA: Diagnosis not present

## 2018-04-11 DIAGNOSIS — I10 Essential (primary) hypertension: Secondary | ICD-10-CM | POA: Insufficient documentation

## 2018-04-11 DIAGNOSIS — Z7982 Long term (current) use of aspirin: Secondary | ICD-10-CM | POA: Diagnosis not present

## 2018-04-11 DIAGNOSIS — R350 Frequency of micturition: Secondary | ICD-10-CM | POA: Diagnosis not present

## 2018-04-11 DIAGNOSIS — Z79899 Other long term (current) drug therapy: Secondary | ICD-10-CM | POA: Insufficient documentation

## 2018-04-11 DIAGNOSIS — R109 Unspecified abdominal pain: Secondary | ICD-10-CM | POA: Diagnosis not present

## 2018-04-11 DIAGNOSIS — N3000 Acute cystitis without hematuria: Secondary | ICD-10-CM

## 2018-04-11 HISTORY — DX: Calculus of gallbladder without cholecystitis without obstruction: K80.20

## 2018-04-11 HISTORY — DX: Cystocele, unspecified: N81.10

## 2018-04-11 LAB — CBC
HCT: 41.3 % (ref 36.0–46.0)
HEMOGLOBIN: 13.5 g/dL (ref 12.0–15.0)
MCH: 29.9 pg (ref 26.0–34.0)
MCHC: 32.7 g/dL (ref 30.0–36.0)
MCV: 91.6 fL (ref 78.0–100.0)
PLATELETS: 272 10*3/uL (ref 150–400)
RBC: 4.51 MIL/uL (ref 3.87–5.11)
RDW: 14.7 % (ref 11.5–15.5)
WBC: 13.8 10*3/uL — AB (ref 4.0–10.5)

## 2018-04-11 LAB — URINALYSIS, ROUTINE W REFLEX MICROSCOPIC
BILIRUBIN URINE: NEGATIVE
GLUCOSE, UA: 50 mg/dL — AB
HGB URINE DIPSTICK: NEGATIVE
Ketones, ur: 20 mg/dL — AB
NITRITE: NEGATIVE
PH: 5 (ref 5.0–8.0)
Protein, ur: 100 mg/dL — AB
SPECIFIC GRAVITY, URINE: 1.026 (ref 1.005–1.030)

## 2018-04-11 LAB — COMPREHENSIVE METABOLIC PANEL
ALK PHOS: 34 U/L — AB (ref 38–126)
ALT: 19 U/L (ref 14–54)
AST: 29 U/L (ref 15–41)
Albumin: 3.9 g/dL (ref 3.5–5.0)
Anion gap: 15 (ref 5–15)
BUN: 23 mg/dL — ABNORMAL HIGH (ref 6–20)
CALCIUM: 9.8 mg/dL (ref 8.9–10.3)
CHLORIDE: 100 mmol/L — AB (ref 101–111)
CO2: 23 mmol/L (ref 22–32)
CREATININE: 0.95 mg/dL (ref 0.44–1.00)
GFR, EST AFRICAN AMERICAN: 59 mL/min — AB (ref >60)
GFR, EST NON AFRICAN AMERICAN: 51 mL/min — AB (ref >60)
Glucose, Bld: 244 mg/dL — ABNORMAL HIGH (ref 65–99)
Potassium: 3.8 mmol/L (ref 3.5–5.1)
SODIUM: 138 mmol/L (ref 135–145)
Total Bilirubin: 0.8 mg/dL (ref 0.3–1.2)
Total Protein: 7.3 g/dL (ref 6.5–8.1)

## 2018-04-11 LAB — LIPASE, BLOOD: LIPASE: 23 U/L (ref 11–51)

## 2018-04-11 MED ORDER — IOPAMIDOL (ISOVUE-300) INJECTION 61%
INTRAVENOUS | Status: AC
  Filled 2018-04-11: qty 100

## 2018-04-11 MED ORDER — CEPHALEXIN 500 MG PO CAPS: 500.0000 mg | ORAL_CAPSULE | Freq: Three times a day (TID) | ORAL | 0 refills | Status: DC

## 2018-04-11 MED ORDER — IOPAMIDOL (ISOVUE-300) INJECTION 61%
100.0000 mL | Freq: Once | INTRAVENOUS | Status: AC | PRN
  Administered 2018-04-11: 100 mL via INTRAVENOUS

## 2018-04-11 MED ORDER — SODIUM CHLORIDE 0.9 % IJ SOLN
INTRAMUSCULAR | Status: AC
  Filled 2018-04-11: qty 50

## 2018-04-11 NOTE — ED Triage Notes (Signed)
Pt stated "began vomiting yesterday, last BM was Saturday.  My stomach is hard as a brick.  I know I have gallstones.  I vomit bile."

## 2018-04-11 NOTE — Discharge Instructions (Addendum)
You were seen today for abdominal pain.  You have evidence of gallstones without gallbladder infection on her CT scan.  You may also have a mild UTI.  You will be treated.  If you develop any new or worsening symptoms you need to be reevaluated immediately.

## 2018-04-11 NOTE — ED Provider Notes (Signed)
[facility] DEPT Provider Note   CSN: [id number] Arrival date & time: 04/11/18  0109     History   Chief Complaint Chief Complaint  Patient presents with  . Abdominal Pain    HPI Kimberly Mcgee is a 84 y.o. female.  HPI  This is a 84 year old female with a history of hypertension, gallstones, bladder flareups who presents with lower abdominal pain.  Patient reports 2-day history of lower worsening abdominal pain.  She states that she had multiple episodes of vomiting "bile."  She denies any diarrhea or constipation.  Last normal bowel movement was yesterday.  She reports urinary frequency but this is not abnormal for her.  She denies dysuria or hematuria.  She denies back pain or flank pain.  Currently she states her pain is completely resolved.  She states "I am good enough to go home."  Pain not worsened with food.  Denies any chest pain, shortness of breath, fevers.  Past Medical History:  Diagnosis Date  . Bladder prolapse, female, acquired   . Gallstones   . Hypertension     Patient Active Problem List   Diagnosis Date Noted  . Encephalopathy 06/08/2017    Past Surgical History:  Procedure Laterality Date  . ABDOMINAL HYSTERECTOMY    . HEMORROIDECTOMY    . KNEE SURGERY       OB History   None      Home Medications    Prior to Admission medications   Medication Sig Start Date End Date Taking? Authorizing Provider  aspirin EC 81 MG tablet Take 81 mg by mouth every other day.   Yes [provider]  Calcium Carbonate-Vitamin D (CALCIUM 600 + D PO) Take 1 tablet by mouth 2 (two) times daily.     Yes [provider]  citalopram (CELEXA) 20 MG tablet Take 20 mg by mouth daily after breakfast.    Yes [provider]  losartan-hydrochlorothiazide (HYZAAR) 100-25 MG per tablet Take 1 tablet by mouth daily after breakfast.  09/12/13  Yes [provider]  Multiple Vitamins-Minerals (Mcgee-A-DAY WOMENS 50  PLUS PO) Take 1 tablet by mouth daily.     Yes [provider]  Omega-3 Fatty Acids (FISH OIL PO) Take 1 tablet by mouth every evening.    Yes [provider]  polyethylene glycol powder (GLYCOLAX/MIRALAX) powder Take 17 g by mouth daily after breakfast.    Yes [provider]  cephALEXin (KEFLEX) 500 MG capsule Take 1 capsule (500 mg total) by mouth 3 (three) times daily. 04/11/18   Horton, Barbette Hair, MD    Family History Kimberly family history on file.  Social History Social History   Tobacco Use  . Smoking status: Never Smoker  . Smokeless tobacco: Never Used  Substance Use Topics  . Alcohol use: Kimberly  . Drug use: Kimberly     Allergies   Patient has Kimberly known allergies.   Review of Systems Review of Systems  Constitutional: Negative for fever.  Respiratory: Negative for shortness of breath.   Cardiovascular: Negative for chest pain.  Gastrointestinal: Positive for abdominal pain. Negative for diarrhea, nausea and vomiting.  Genitourinary: Positive for frequency. Negative for dysuria and hematuria.  Musculoskeletal: Negative for back pain.  All other systems reviewed and are negative.    Physical Exam Updated Vital Signs BP 130/72 (BP Location: Left Arm)   Pulse 68   Temp 98.4 F (36.9 C) (Oral)   Resp 16   Ht 5\' 1"  (1.549  m)   Wt 77.6 kg (171 lb)   SpO2 98%   BMI 32.31 kg/m   Physical Exam  Constitutional: She is oriented to person, place, and time. She appears well-developed and well-nourished.  Elderly, well-appearing, Kimberly acute distress  HENT:  Head: Normocephalic and atraumatic.  Neck: Neck supple.  Cardiovascular: Normal rate, regular rhythm and normal heart sounds.  Pulmonary/Chest: Effort normal and breath sounds normal. Kimberly respiratory distress. She has Kimberly wheezes.  Abdominal: Soft. Normal appearance and bowel sounds are normal. There is Kimberly tenderness. There is Kimberly rebound and Kimberly guarding.  Genitourinary: Uterus is not tender.    Neurological: She is alert and oriented to person, place, and time.  Skin: Skin is warm and dry.  Psychiatric: She has a normal mood and affect.  Nursing note and vitals reviewed.    ED Treatments / Results  Labs (all labs ordered are listed, but only abnormal results are displayed) Labs Reviewed  COMPREHENSIVE METABOLIC PANEL - Abnormal; Notable for the following components:      Result Value   Chloride 100 (*)    Glucose, Bld 244 (*)    BUN 23 (*)    Alkaline Phosphatase 34 (*)    GFR calc non Af Amer 51 (*)    GFR calc Af Amer 59 (*)    All other components within normal limits  CBC - Abnormal; Notable for the following components:   WBC 13.8 (*)    All other components within normal limits  URINALYSIS, ROUTINE W REFLEX MICROSCOPIC - Abnormal; Notable for the following components:   APPearance CLOUDY (*)    Glucose, UA 50 (*)    Ketones, ur 20 (*)    Protein, ur 100 (*)    Leukocytes, UA MODERATE (*)    Bacteria, UA RARE (*)    Squamous Epithelial / LPF 6-30 (*)    All other components within normal limits  URINE CULTURE  LIPASE, BLOOD    EKG None  Radiology Ct Abdomen Pelvis W Contrast  Result Date: 04/11/2018 CLINICAL DATA:  84 year old with upper abdominal pain.  Nausea. EXAM: CT ABDOMEN AND PELVIS WITH CONTRAST TECHNIQUE: Multidetector CT imaging of the abdomen and pelvis was performed using the standard protocol following bolus administration of intravenous contrast. CONTRAST:  164mL ISOVUE-300 IOPAMIDOL (ISOVUE-300) INJECTION 61% COMPARISON:  CT 10/11/2014 FINDINGS: Lower chest: Kimberly acute abnormality.  Moderate-sized hiatal hernia. Hepatobiliary: Decreased hepatic density consistent with steatosis. Kimberly focal hepatic lesion. Calcified gallstones in the gallbladder without pericholecystic inflammation. Kimberly biliary dilatation. Pancreas: Scattered small cysts are all subcentimeter. Mild atrophy of the body. Kimberly ductal dilatation or inflammation. Spleen: Normal in size  without focal abnormality. Adrenals/Urinary Tract: Normal adrenal glands. Kimberly hydronephrosis or perinephric edema. Homogeneous renal enhancement with symmetric excretion on delayed phase imaging. Bilateral parapelvic cysts and cortical cysts in the left kidney. Urinary bladder is physiologically distended without wall thickening. Stomach/Bowel: Moderate hiatal hernia. Duodenum diverticulum without inflammatory change. Pelvic bowel loops are fluid-filled but nondilated, Kimberly bowel obstruction or inflammation. Moderate stool burden throughout the colon. Mild distal colonic diverticulosis. Kimberly diverticulitis or colonic inflammation. Vascular/Lymphatic: Aortic atherosclerosis without aneurysm. Enteric vessels are patent Kimberly adenopathy. Reproductive: Status post hysterectomy. Kimberly adnexal masses. Other: Kimberly free air, free fluid, or intra-abdominal fluid collection. Musculoskeletal: Scoliotic curvature in degenerative change in the spine. There are Kimberly acute or suspicious osseous abnormalities. IMPRESSION: 1. Gallstones without CT findings of acute gallbladder inflammation. 2. Moderate hiatal hernia. 3. Mild hepatic steatosis. Colonic diverticulosis without diverticulitis. Aortic Atherosclerosis (  ICD10-I70.0). Electronically Signed   By: Jeb Levering M.D.   On: 04/11/2018 05:33    Procedures Procedures (including critical care time)  Medications Ordered in ED Medications  sodium chloride 0.9 % injection (has Kimberly administration in time range)  iopamidol (ISOVUE-300) 61 % injection 100 mL (100 mLs Intravenous Contrast Given 04/11/18 0507)     Initial Impression / Assessment and Plan / ED Course  I have reviewed the triage vital signs and the nursing notes.  Pertinent labs & imaging results that were available during my care of the patient were reviewed by me and considered in my medical decision making (see chart for details).     Patient presents with lower abdominal pain.  Pain has resolved since arrival.   Abdomen is nontender patient's vital signs are reassuring.  She is afebrile.  Exam is fairly benign.  She does have a history of hysterectomy which would put her at risk for small bowel obstruction.  Other considerations include UTI, appendicitis, less likely cholecystitis given location of described pain.  Given age, will obtain CT scan.  Basic lab work notable only for mild cytosis.  CT scan shows gallstones without evidence of cholecystitis.  Clinically have low suspicion at this time.  She does have a hiatal hernia.  Otherwise CT is reassuring.  On recheck, she continues to be pain-free.  Urinalysis with possible UTI.  Urine culture sent.  Will treat with Keflex.  Patient and her son updated.  Patient was given return precautions.  After history, exam, and medical workup I feel the patient has been appropriately medically screened and is safe for discharge home. Pertinent diagnoses were discussed with the patient. Patient was given return precautions.   Final Clinical Impressions(s) / ED Diagnoses   Final diagnoses:  Lower abdominal pain  Gallstones  Acute cystitis without hematuria    ED Discharge Orders        Ordered    cephALEXin (KEFLEX) 500 MG capsule  3 times daily     04/11/18 0545       Horton, Barbette Hair, MD 04/11/18 [id number]

## 2018-04-12 DIAGNOSIS — Z1212 Encounter for screening for malignant neoplasm of rectum: Secondary | ICD-10-CM | POA: Diagnosis not present

## 2018-04-13 LAB — URINE CULTURE

## 2018-04-27 DIAGNOSIS — M1711 Unilateral primary osteoarthritis, right knee: Secondary | ICD-10-CM | POA: Diagnosis not present

## 2018-06-09 DIAGNOSIS — M1711 Unilateral primary osteoarthritis, right knee: Secondary | ICD-10-CM | POA: Diagnosis not present

## 2018-06-17 DIAGNOSIS — L570 Actinic keratosis: Secondary | ICD-10-CM | POA: Diagnosis not present

## 2018-06-17 DIAGNOSIS — L820 Inflamed seborrheic keratosis: Secondary | ICD-10-CM | POA: Diagnosis not present

## 2018-10-12 DIAGNOSIS — K5909 Other constipation: Secondary | ICD-10-CM | POA: Diagnosis not present

## 2018-10-12 DIAGNOSIS — Z1389 Encounter for screening for other disorder: Secondary | ICD-10-CM | POA: Diagnosis not present

## 2018-10-12 DIAGNOSIS — N183 Chronic kidney disease, stage 3 (moderate): Secondary | ICD-10-CM | POA: Diagnosis not present

## 2018-10-12 DIAGNOSIS — E668 Other obesity: Secondary | ICD-10-CM | POA: Diagnosis not present

## 2018-10-12 DIAGNOSIS — N329 Bladder disorder, unspecified: Secondary | ICD-10-CM | POA: Diagnosis not present

## 2018-10-12 DIAGNOSIS — E1122 Type 2 diabetes mellitus with diabetic chronic kidney disease: Secondary | ICD-10-CM | POA: Diagnosis not present

## 2018-10-12 DIAGNOSIS — F411 Generalized anxiety disorder: Secondary | ICD-10-CM | POA: Diagnosis not present

## 2018-10-12 DIAGNOSIS — Z6832 Body mass index (BMI) 32.0-32.9, adult: Secondary | ICD-10-CM | POA: Diagnosis not present

## 2018-10-12 DIAGNOSIS — M25561 Pain in right knee: Secondary | ICD-10-CM | POA: Diagnosis not present

## 2018-10-12 DIAGNOSIS — I129 Hypertensive chronic kidney disease with stage 1 through stage 4 chronic kidney disease, or unspecified chronic kidney disease: Secondary | ICD-10-CM | POA: Diagnosis not present

## 2018-10-12 DIAGNOSIS — I700 Atherosclerosis of aorta: Secondary | ICD-10-CM | POA: Diagnosis not present

## 2018-10-12 DIAGNOSIS — M859 Disorder of bone density and structure, unspecified: Secondary | ICD-10-CM | POA: Diagnosis not present

## 2019-02-08 DIAGNOSIS — D1801 Hemangioma of skin and subcutaneous tissue: Secondary | ICD-10-CM | POA: Diagnosis not present

## 2019-02-08 DIAGNOSIS — D225 Melanocytic nevi of trunk: Secondary | ICD-10-CM | POA: Diagnosis not present

## 2019-02-08 DIAGNOSIS — L821 Other seborrheic keratosis: Secondary | ICD-10-CM | POA: Diagnosis not present

## 2019-02-08 DIAGNOSIS — L308 Other specified dermatitis: Secondary | ICD-10-CM | POA: Diagnosis not present

## 2019-02-08 DIAGNOSIS — L570 Actinic keratosis: Secondary | ICD-10-CM | POA: Diagnosis not present

## 2019-02-08 DIAGNOSIS — L814 Other melanin hyperpigmentation: Secondary | ICD-10-CM | POA: Diagnosis not present

## 2019-02-08 DIAGNOSIS — L218 Other seborrheic dermatitis: Secondary | ICD-10-CM | POA: Diagnosis not present

## 2019-04-14 DIAGNOSIS — R10817 Generalized abdominal tenderness: Secondary | ICD-10-CM | POA: Diagnosis not present

## 2019-05-09 DIAGNOSIS — K219 Gastro-esophageal reflux disease without esophagitis: Secondary | ICD-10-CM | POA: Diagnosis not present

## 2019-05-09 DIAGNOSIS — N183 Chronic kidney disease, stage 3 (moderate): Secondary | ICD-10-CM | POA: Diagnosis not present

## 2019-05-09 DIAGNOSIS — I129 Hypertensive chronic kidney disease with stage 1 through stage 4 chronic kidney disease, or unspecified chronic kidney disease: Secondary | ICD-10-CM | POA: Diagnosis not present

## 2019-05-09 DIAGNOSIS — R1010 Upper abdominal pain, unspecified: Secondary | ICD-10-CM | POA: Diagnosis not present

## 2019-05-09 DIAGNOSIS — K449 Diaphragmatic hernia without obstruction or gangrene: Secondary | ICD-10-CM | POA: Diagnosis not present

## 2019-05-09 DIAGNOSIS — K8020 Calculus of gallbladder without cholecystitis without obstruction: Secondary | ICD-10-CM | POA: Diagnosis not present

## 2019-05-09 DIAGNOSIS — K5900 Constipation, unspecified: Secondary | ICD-10-CM | POA: Diagnosis not present

## 2019-05-10 ENCOUNTER — Other Ambulatory Visit: Payer: Self-pay | Admitting: Internal Medicine

## 2019-05-10 DIAGNOSIS — K8020 Calculus of gallbladder without cholecystitis without obstruction: Secondary | ICD-10-CM

## 2019-05-12 ENCOUNTER — Ambulatory Visit
Admission: RE | Admit: 2019-05-12 | Discharge: 2019-05-12 | Disposition: A | Discharge status: Home / Self Care | Payer: Medicare Other | Source: Ambulatory Visit | Attending: Internal Medicine | Admitting: Internal Medicine

## 2019-05-12 DIAGNOSIS — K8020 Calculus of gallbladder without cholecystitis without obstruction: Secondary | ICD-10-CM

## 2019-05-12 DIAGNOSIS — K8080 Other cholelithiasis without obstruction: Secondary | ICD-10-CM | POA: Diagnosis not present

## 2019-05-20 ENCOUNTER — Telehealth: Payer: Self-pay | Admitting: Internal Medicine

## 2019-05-20 NOTE — Telephone Encounter (Signed)
Left message on machine to call back  

## 2019-05-20 NOTE — Telephone Encounter (Signed)
Pt is sched but symptoms are getting worse and need to see the doctor sooner.

## 2019-05-26 ENCOUNTER — Telehealth: Payer: Self-pay | Admitting: Internal Medicine

## 2019-05-26 NOTE — Telephone Encounter (Signed)
Okay for in person visit. 

## 2019-05-26 NOTE — Telephone Encounter (Signed)
Dr Hilarie Fredrickson, please advise....  Pt is on schedule for 05/31/19 for "abd pain,constipation,abd bloating,stomach feel warm to touch." Ok to come to office or do you want to keep as virtual visit for now? Daughter wants to accompany mom.

## 2019-05-26 NOTE — Telephone Encounter (Signed)
Patient daughter [phone] called would like to know if the patient can be seen in the office. She would be coming with her mother (the patient)

## 2019-05-26 NOTE — Telephone Encounter (Signed)
I have advised patient's daughter that per Dr Hilarie Fredrickson, patient could be seen in office. Patient's daughter is her healthcare POA and indicates that at times patient gets confused with medical info so she will accompany her to her appointment. Advised she and mom should wear masks to appointment. She verbalizes understanding.

## 2019-05-30 ENCOUNTER — Telehealth: Payer: Self-pay | Admitting: *Deleted

## 2019-05-30 NOTE — Telephone Encounter (Signed)
Left message. Need to do COVID screening for patient who is coming to office tomorrow.  Covid-19 screening questions  Have you traveled in the last 14 days? If yes where?  Do you now or have you had a fever in the last 14 days?  Do you have any respiratory symptoms of shortness of breath or cough now or in the last 14 days?  Do you have any family members or close contacts with diagnosed or suspected Covid-19 in the past 14 days?  Have you been tested for Covid-19 and found to be positive?

## 2019-05-31 ENCOUNTER — Encounter: Payer: Self-pay | Admitting: Internal Medicine

## 2019-05-31 ENCOUNTER — Ambulatory Visit (INDEPENDENT_AMBULATORY_CARE_PROVIDER_SITE_OTHER): Payer: PPO | Admitting: Internal Medicine

## 2019-05-31 ENCOUNTER — Other Ambulatory Visit: Payer: Self-pay

## 2019-05-31 VITALS — BP 142/64 | HR 76 | Ht 61.0 in | Wt 171.8 lb

## 2019-05-31 DIAGNOSIS — K5909 Other constipation: Secondary | ICD-10-CM

## 2019-05-31 DIAGNOSIS — K8020 Calculus of gallbladder without cholecystitis without obstruction: Secondary | ICD-10-CM | POA: Diagnosis not present

## 2019-05-31 DIAGNOSIS — R1011 Right upper quadrant pain: Secondary | ICD-10-CM

## 2019-05-31 MED ORDER — LINACLOTIDE 145 MCG PO CAPS: 145.0000 ug | ORAL_CAPSULE | Freq: Every day | ORAL | 0 refills | Status: DC

## 2019-05-31 NOTE — Patient Instructions (Signed)
You have been scheduled for an appointment with Dr Donne Hazel at [facility]. Your appointment is on 06/14/19 at 2:39 pm. Make certain to bring a list of current medications, including any over the counter medications or vitamins. Also bring your co-pay if you have one as well as your insurance cards. [facility] is located at [address]. Should you need to reschedule your appointment, please contact them at [phone].   We have sent the following medications to your pharmacy for you to pick up at your convenience: Linzess 145 mcg daily.  Discontinue all other laxatives for now.  Call our office in 1 week to let us know how you are feeling. [phone]. Ask to speak with Vaughan Basta.

## 2019-05-31 NOTE — Telephone Encounter (Signed)
Pt is scheduled for today with Dr. Hilarie Fredrickson.

## 2019-05-31 NOTE — Progress Notes (Signed)
Patient ID: LYNDSEE CASA, female   DOB: 11/28/1925, 85 y.o.   MRN: [id number] HPI: Kazandra Forstrom is a 85 year old female with a past medical history of gallstones, colonic diverticulosis, rectal/anal stenosis, hypertension who is seen in consult at the request of Dr. Virgina Jock to evaluate RUQ pain and constipation.  She is seen today in person with her daughter.    She reports that she has had episodes of right upper quadrant and right mid abdominal pain.  She has had this happen twice in the last several months the first in April 2020 the second in May 2020.  She was seen in the ER because these episodes were so severe.  They occurred at night waking her from sleep.  They were associated with severe right upper quadrant abdominal discomfort and nausea with bilious emesis.  She reports that she was told that she had a UTI on 1 occasion and was treated with antibiotics.  Both of these episodes occurred after eating greasy food first with a greasy hamburger and the second with [place] food.  In between these attacks she does not have right upper quadrant abdominal pain.  Separately she struggles with chronic lifelong constipation.  She reports her abdomen at times will feel hard particularly in the upper and mid abdomen.  Is associated with abdominal bloating.  She uses MiraLAX, Metamucil and stool softeners.  She reports feeling it necessary to have a bowel movement every day.  At times she will have loose stools and even diarrhea.  She also occasionally uses Dulcolax.  Many years ago she would drink mineral oil.  She reports also struggling with bladder prolapse symptom.  She has had imaging performed over the last 2 years which I have reviewed.  In 2019 she had a CT scan of the abdomen pelvis.  This showed gallstones, fatty liver, diverticulosis without diverticulitis, constipation and a hiatal hernia.  2 weeks ago she had an ultrasound which showed gallstones without cholecystitis.    Past Medical History:   Diagnosis Date  . Bladder prolapse, female, acquired   . Gallstones   . Hypertension     Past Surgical History:  Procedure Laterality Date  . ABDOMINAL HYSTERECTOMY    . HEMORROIDECTOMY    . KNEE SURGERY      Outpatient Medications Prior to Visit  Medication Sig Dispense Refill  . aspirin EC 81 MG tablet Take 81 mg by mouth every other day.    . Calcium Carbonate-Vitamin D (CALCIUM 600 + D PO) Take 1 tablet by mouth 2 (two) times daily.      . cephALEXin (KEFLEX) 500 MG capsule Take 1 capsule (500 mg total) by mouth 3 (three) times daily. 21 capsule 0  . citalopram (CELEXA) 20 MG tablet Take 20 mg by mouth daily after breakfast.     . losartan-hydrochlorothiazide (HYZAAR) 100-25 MG per tablet Take 1 tablet by mouth daily after breakfast.     . Multiple Vitamins-Minerals (ONE-A-DAY WOMENS 50 PLUS PO) Take 1 tablet by mouth daily.      . Omega-3 Fatty Acids (FISH OIL PO) Take 1 tablet by mouth every evening.     . polyethylene glycol powder (GLYCOLAX/MIRALAX) powder Take 17 g by mouth daily after breakfast.      No facility-administered medications prior to visit.     No Known Allergies  No family history on file.  Social History   Tobacco Use  . Smoking status: Never Smoker  . Smokeless tobacco: Never Used  Substance Use Topics  .  Alcohol use: No  . Drug use: No    ROS: As per history of present illness, otherwise negative  There were no vitals taken for this visit. Constitutional: Well-developed and well-nourished. No distress. HEENT: Normocephalic and atraumatic.  Conjunctivae are normal.  No scleral icterus. Neck: Neck supple. Trachea midline. Cardiovascular: Normal rate, regular rhythm and intact distal pulses. Soft SEM Pulmonary/chest: Effort normal and breath sounds normal. No wheezing, rales or rhonchi. Abdominal: Soft, obese, nontender, nondistended. Bowel sounds active throughout. There are no masses palpable.  Extremities: no clubbing, cyanosis, or  edema Neurological: Alert and oriented to person place and time. Skin: Skin is warm and dry.  Psychiatric: Normal mood and affect. Behavior is normal.  RELEVANT LABS AND IMAGING: CBC    Component Value Date/Time   WBC 13.8 (H) 04/11/2018 0206   RBC 4.51 04/11/2018 0206   HGB 13.5 04/11/2018 0206   HCT 41.3 04/11/2018 0206   PLT 272 04/11/2018 0206   MCV 91.6 04/11/2018 0206   MCH 29.9 04/11/2018 0206   MCHC 32.7 04/11/2018 0206   RDW 14.7 04/11/2018 0206   LYMPHSABS 2.2 06/08/2017 1440   MONOABS 0.9 06/08/2017 1440   EOSABS 0.2 06/08/2017 1440   BASOSABS 0.1 06/08/2017 1440    CMP     Component Value Date/Time   NA 138 04/11/2018 0206   K 3.8 04/11/2018 0206   CL 100 (L) 04/11/2018 0206   CO2 23 04/11/2018 0206   GLUCOSE 244 (H) 04/11/2018 0206   BUN 23 (H) 04/11/2018 0206   CREATININE 0.95 04/11/2018 0206   CALCIUM 9.8 04/11/2018 0206   PROT 7.3 04/11/2018 0206   ALBUMIN 3.9 04/11/2018 0206   AST 29 04/11/2018 0206   ALT 19 04/11/2018 0206   ALKPHOS 34 (L) 04/11/2018 0206   BILITOT 0.8 04/11/2018 0206   GFRNONAA 51 (L) 04/11/2018 0206   GFRAA 59 (L) 04/11/2018 0206  CT ABDOMEN AND PELVIS WITH CONTRAST   TECHNIQUE: Multidetector CT imaging of the abdomen and pelvis was performed using the standard protocol following bolus administration of intravenous contrast.   CONTRAST:  162mL ISOVUE-300 IOPAMIDOL (ISOVUE-300) INJECTION 61%   COMPARISON:  CT 10/11/2014   FINDINGS: Lower chest: No acute abnormality.  Moderate-sized hiatal hernia.   Hepatobiliary: Decreased hepatic density consistent with steatosis. No focal hepatic lesion. Calcified gallstones in the gallbladder without pericholecystic inflammation. No biliary dilatation.   Pancreas: Scattered small cysts are all subcentimeter. Mild atrophy of the body. No ductal dilatation or inflammation.   Spleen: Normal in size without focal abnormality.   Adrenals/Urinary Tract: Normal adrenal glands. No  hydronephrosis or perinephric edema. Homogeneous renal enhancement with symmetric excretion on delayed phase imaging. Bilateral parapelvic cysts and cortical cysts in the left kidney. Urinary bladder is physiologically distended without wall thickening.   Stomach/Bowel: Moderate hiatal hernia. Duodenum diverticulum without inflammatory change. Pelvic bowel loops are fluid-filled but nondilated, no bowel obstruction or inflammation. Moderate stool burden throughout the colon. Mild distal colonic diverticulosis. No diverticulitis or colonic inflammation.   Vascular/Lymphatic: Aortic atherosclerosis without aneurysm. Enteric vessels are patent no adenopathy.   Reproductive: Status post hysterectomy. No adnexal masses.   Other: No free air, free fluid, or intra-abdominal fluid collection.   Musculoskeletal: Scoliotic curvature in degenerative change in the spine. There are no acute or suspicious osseous abnormalities.   IMPRESSION: 1. Gallstones without CT findings of acute gallbladder inflammation. 2. Moderate hiatal hernia. 3. Mild hepatic steatosis. Colonic diverticulosis without diverticulitis. Aortic Atherosclerosis (ICD10-I70.0).     Electronically Signed  By: Jeb Levering M.D.   On: 04/11/2018 05:33   ULTRASOUND ABDOMEN LIMITED RIGHT UPPER QUADRANT   COMPARISON:  CT abdomen 04/11/2018   FINDINGS: Gallbladder:   Multiple small gallstones demonstrated within the gallbladder lumen. No gallbladder wall thickening or pericholecystic fluid. Negative sonographic Murphy sign.   Common bile duct:   Diameter: 4 mm   Liver:   Increase in echogenicity. No focal lesion identified. Portal vein is patent on color Doppler imaging with normal direction of blood flow towards the liver.   IMPRESSION: Cholelithiasis without sonographic evidence for acute cholecystitis.   Hepatic steatosis.     Electronically Signed   By: Lovey Newcomer M.D.   On: 05/12/2019  14:30   ASSESSMENT/PLAN: 85 year old female with a past medical history of gallstones, colonic diverticulosis, rectal/anal stenosis, hypertension who is seen in consult at the request of Dr. Virgina Jock to evaluate RUQ pain and constipation.  1.  Episodic right upper quadrant pain in the setting of gallstones --her episodic pain is classic for biliary pain precipitated by her gallstones.  We have discussed this at length today.  I recommend consultation with surgery.  Imaging proves gallstones.  I recommended a low-fat diet while waiting to discuss surgery. --She will see Dr. Donne Hazel in a couple of weeks  2.  Chronic constipation --lifelong constipation likely exacerbated by pelvic floor dysfunction and bladder prolapse.  We will try a prescription laxative with Linzess 145 mcg daily.  For now this will replace Metamucil, MiraLAX, stool softener and Dulcolax.  We will likely have to dose titrate for efficacy.     ZO:XWRUE, Jenny Reichmann, [place], [place] [id number]   45 minutes spent today. Greater than 50% was spent in counseling and coordination of care with the patient

## 2019-05-31 NOTE — Telephone Encounter (Signed)
Left another message for patient to call back. If I do not hear back, we will just screen her when she gets to the office.

## 2019-06-09 ENCOUNTER — Telehealth: Payer: Self-pay | Admitting: Internal Medicine

## 2019-06-09 NOTE — Telephone Encounter (Signed)
Pt requested to speak to you again concerning diarrhea.

## 2019-06-09 NOTE — Telephone Encounter (Signed)
Patient called said that she is having very bad Diarrhea and a little blood with it. Would like to speak to someone

## 2019-06-09 NOTE — Telephone Encounter (Signed)
Called pt back and discussed plan with her. She states that since speaking with her earlier she feels she has passed the clump of stool that was there. She states the diarrhea has stopped oozing out now. She also states that her bottom is very sore from going so much and wonders is she needs the miralax now. Pt wants to know what she should do now, does not want to have this problem over the weekend.

## 2019-06-09 NOTE — Telephone Encounter (Signed)
Spoke with pt and she is aware.

## 2019-06-09 NOTE — Telephone Encounter (Signed)
Pt states she has been taking the Linzess for about a week and it had been working she thought, but this am she reports she has bad diarrhea that is just leaking out. Pt states she feels like there is a lump of stool in her rectum that she just cannot pass, diarrhea is oozing out around it. Also reports she has had a few drops of blood and her bottom is sore. Seeing BRB when she wipes. Pt wants to know what Dr. Hilarie Fredrickson recommends. Please advise.

## 2019-06-09 NOTE — Telephone Encounter (Signed)
Patient has had more diarrhea this pm.  She is advised to hold any more laxatives this pm and can try to resume linzess in am as Dr. Hilarie Fredrickson recommended.

## 2019-06-09 NOTE — Telephone Encounter (Signed)
I recommend a bowel purge to try and completely empty the colon and then resume Linzess to see if it is more effective MiraLax prep, 255 g MiraLax in 64 oz gatorade.  Drink 32 oz over 3-4 hours -- this will cause diarrhea. If she feels completely emptying after 1st half, she can stop, but if not then take 2nd half over 3-4 hours (later same day or the next) Then after bowel purge, repeat Linzess 145 mcg daily  Have her let me know how she responds to this treatment

## 2019-06-09 NOTE — Telephone Encounter (Signed)
Would Linzess as the only laxative for next week or so and gauge her response Thus for now stop MiraLax, etc and Linzess only once daily

## 2019-06-13 ENCOUNTER — Telehealth: Payer: Self-pay | Admitting: Internal Medicine

## 2019-06-13 NOTE — Telephone Encounter (Signed)
Patient said she is following up on the previous msg.

## 2019-06-13 NOTE — Telephone Encounter (Signed)
Patient called in and stated that she is having more problems with the medication. She would like a call back from the nurse to discuss.

## 2019-06-14 NOTE — Telephone Encounter (Signed)
Pt calling back regarding this °

## 2019-06-14 NOTE — Telephone Encounter (Signed)
States she wanted to talked with Vaughan Basta or Carla Drape  On Linzess only as she was directed. She calls with complaints of incomplete evacuation. She has "strained as much as I think I should. My bottom is so sore I can hardly sit down. Even water hurts now." She states she wants an adjustment of the Linzess dosage and "a cream for my bottom."  Admits to "a little blood" when she wipes to clean.

## 2019-06-14 NOTE — Telephone Encounter (Signed)
Okay, let us increase Linzess to 290 mcg daily I recommend that we perform a flexible sigmoidoscopy to evaluate for structural lesions in the distal colon and rectum that may be exacerbating her constipation and difficult evacuation

## 2019-06-15 ENCOUNTER — Other Ambulatory Visit: Payer: Self-pay

## 2019-06-15 MED ORDER — CLOTRIMAZOLE-BETAMETHASONE 1-0.05 % EX CREA: 1.0000 "application " | TOPICAL_CREAM | Freq: Two times a day (BID) | CUTANEOUS | 0 refills | Status: AC

## 2019-06-15 NOTE — Telephone Encounter (Signed)
Patient calls back. She declines to schedule a flexible sigmoidoscopy presently because she had a soft bowel movement this morning and feel fully emptied. Agrees to increase the Linzess to 290 mcg daily 30 minutes before her meal. For her rectal discomfort, she will use Desitin cream. She is interested in using the Lotrisone cream. Please send the Rx to [facility].

## 2019-06-15 NOTE — Telephone Encounter (Signed)
Called patient. She answers the phone and tell me she is in the shower. She is interested in discussing the flex sig and the increase in linzess. Agrees to call back once she is out and able to talk.

## 2019-06-24 ENCOUNTER — Other Ambulatory Visit: Payer: Self-pay

## 2019-06-24 ENCOUNTER — Telehealth: Payer: Self-pay | Admitting: Internal Medicine

## 2019-06-24 NOTE — Telephone Encounter (Signed)
Patient called in wanting to speak with the nurse. She stated that she is still having the same problems with the medication

## 2019-06-24 NOTE — Telephone Encounter (Signed)
Left message to call back  

## 2019-06-24 NOTE — Telephone Encounter (Signed)
Spoke with the patient in detail. She is interested in scheduling the flex sig and will talk with her daughter about it this weekend. She is interested 06/29/19 and 07/05/19. She is going to drink a dose of Miralax today to try to complete her bowel evacuation.  She will call us back and Monday. She will ask to speak with Vaughan Basta.

## 2019-06-24 NOTE — Telephone Encounter (Signed)
Dr Hilarie Fredrickson this very kind patient is taking the Linzess 290 mcg daily as directed. She is having daily soft bowel movements. She continues to feel she has to push hard to evacuate her bowels. She says "the opening is just so small and it feels like it tears and scrapes to get the waste out." She had been taking mineral oil. Feels she did a little better with mineral oil taken PO. Please advise.

## 2019-06-24 NOTE — Telephone Encounter (Signed)
Flexible sigmoidoscopy is recommended given suspicion for anal stenosis (this can likely be dilated during sedated examination) but also to exclude other rectal pathology  If she is hesitant then would offer office visit

## 2019-06-27 ENCOUNTER — Other Ambulatory Visit: Payer: Self-pay

## 2019-06-27 DIAGNOSIS — K5909 Other constipation: Secondary | ICD-10-CM

## 2019-06-27 DIAGNOSIS — K624 Stenosis of anus and rectum: Secondary | ICD-10-CM

## 2019-06-27 NOTE — Telephone Encounter (Signed)
Pt called back and scheduled Flex-sig 06/29/19@9 :30am. Prep reviewed over the phone with pt. She is concerned and states that doing the enema would be a problem for her due to the small opening. Pt does not think she would be able to do the enema. Please advise.

## 2019-06-27 NOTE — Telephone Encounter (Signed)
Patient is calling to schedule a flex sig and would like to speak to [place].

## 2019-06-27 NOTE — Telephone Encounter (Signed)
Spoke with pt and she is aware.

## 2019-06-27 NOTE — Telephone Encounter (Signed)
If she doesn't feel comfortable with enema at home, it can be skipped. Stay on [place] and ok for MiraLax or even Mg Citrate before the procedure We could possibly give enema here depending on how she is feeling/prep result on intake exam

## 2019-06-28 ENCOUNTER — Telehealth: Payer: Self-pay | Admitting: Internal Medicine

## 2019-06-28 NOTE — Telephone Encounter (Signed)
Patient wanted to speak with the nurse about the procedure that she is schedule for tomorrow

## 2019-06-28 NOTE — Telephone Encounter (Signed)

## 2019-06-28 NOTE — Telephone Encounter (Signed)
Patient called back and answer no to all questions.

## 2019-06-28 NOTE — Telephone Encounter (Signed)
I have spoken to patient and have answered all of her questions to her satisfaction.

## 2019-06-29 ENCOUNTER — Ambulatory Visit (AMBULATORY_SURGERY_CENTER): Payer: PPO | Admitting: Internal Medicine

## 2019-06-29 ENCOUNTER — Other Ambulatory Visit: Payer: Self-pay

## 2019-06-29 ENCOUNTER — Telehealth: Payer: Self-pay | Admitting: Internal Medicine

## 2019-06-29 ENCOUNTER — Encounter: Payer: Self-pay | Admitting: Internal Medicine

## 2019-06-29 VITALS — BP 133/75 | HR 56 | Temp 98.7°F | Resp 12 | Ht 61.0 in | Wt 171.0 lb

## 2019-06-29 DIAGNOSIS — K5900 Constipation, unspecified: Secondary | ICD-10-CM

## 2019-06-29 DIAGNOSIS — K624 Stenosis of anus and rectum: Secondary | ICD-10-CM

## 2019-06-29 DIAGNOSIS — I10 Essential (primary) hypertension: Secondary | ICD-10-CM | POA: Diagnosis not present

## 2019-06-29 DIAGNOSIS — K6389 Other specified diseases of intestine: Secondary | ICD-10-CM | POA: Diagnosis not present

## 2019-06-29 DIAGNOSIS — K5909 Other constipation: Secondary | ICD-10-CM | POA: Diagnosis not present

## 2019-06-29 DIAGNOSIS — R198 Other specified symptoms and signs involving the digestive system and abdomen: Secondary | ICD-10-CM

## 2019-06-29 MED ORDER — SODIUM CHLORIDE 0.9 % IV SOLN: 500.0000 mL | Freq: Once | INTRAVENOUS | Status: DC

## 2019-06-29 NOTE — Progress Notes (Signed)
Patient drank 6-8 ounces of fluids. Doctor aware.

## 2019-06-29 NOTE — Op Note (Signed)
[facility] Patient Name: Kimberly Mcgee Procedure Date: 06/29/2019 9:03 AM MRN: [id number] Endoscopist: Jerene Bears , MD Age: 85 Referring MD:  Date of Birth: Nov 26, 1925 Gender: Female Account #: [id number] Procedure:                Flexible Sigmoidoscopy Indications:              Change in stool caliber with expect anal stenosis,                            abnormal defecation, Constipation Medicines:                Monitored Anesthesia Care Procedure:                Pre-Anesthesia Assessment:                           - Prior to the procedure, a History and Physical                            was performed, and patient medications and                            allergies were reviewed. The patient's tolerance of                            previous anesthesia was also reviewed. The risks                            and benefits of the procedure and the sedation                            options and risks were discussed with the patient.                            All questions were answered, and informed consent                            was obtained. Prior Anticoagulants: The patient has                            taken no previous anticoagulant or antiplatelet                            agents. ASA Grade Assessment: II - A patient with                            mild systemic disease. After reviewing the risks                            and benefits, the patient was deemed in                            satisfactory condition to undergo the procedure.  After obtaining informed consent, the scope was                            passed under direct vision. The Colonoscope was                            introduced through the anus and advanced to the                            sigmoid colon. The flexible sigmoidoscopy was                            accomplished without difficulty. The patient                            tolerated the procedure well. The  quality of the                            bowel preparation was good. Scope In: Scope Out: Findings:                 The digital rectal exam findings include anal                            stricture. The anal canal was narrowed but was able                            to be dilated manually with digital rectal exam.                            There were no masses. On endoscopic inspection a                            mucosal rent at the anal stenosis was visible with                            self-limited bleeding.                           The rectum and examined sigmoid colon appeared                            normal. No evidence of polyps.                           Anal papilla(e) were hypertrophied on retroflexed                            views in the rectum. Complications:            No immediate complications. Estimated Blood Loss:     Estimated blood loss was minimal. Impression:               - Anal stricture found on digital rectal exam.  Manual dilation with DRE.                           - The rectum and examined sigmoid colon are normal.                           - No specimens collected. Recommendation:           - Patient has a contact number available for                            emergencies. The signs and symptoms of potential                            delayed complications were discussed with the                            patient. Return to normal activities tomorrow.                            Written discharge instructions were provided to the                            patient.                           - Resume previous diet.                           - Continue present medications.                           - Office follow-up when needed. Jerene Bears, MD 06/29/2019 9:37:30 AM This report has been signed electronically.

## 2019-06-29 NOTE — Patient Instructions (Signed)
Continue with Linzess, you may see a small amount of bleeding and your rectum may be sore.  YOU HAD AN ENDOSCOPIC PROCEDURE TODAY AT [facility]:   Refer to the procedure report that was given to you for any specific questions about what was found during the examination.  If the procedure report does not answer your questions, please call your gastroenterologist to clarify.  If you requested that your care partner not be given the details of your procedure findings, then the procedure report has been included in a sealed envelope for you to review at your convenience later.  YOU SHOULD EXPECT: Some feelings of bloating in the abdomen. Passage of more gas than usual.  Walking can help get rid of the air that was put into your GI tract during the procedure and reduce the bloating. If you had a lower endoscopy (such as a colonoscopy or flexible sigmoidoscopy) you may notice spotting of blood in your stool or on the toilet paper. If you underwent a bowel prep for your procedure, you may not have a normal bowel movement for a few days.  Please Note:  You might notice some irritation and congestion in your nose or some drainage.  This is from the oxygen used during your procedure.  There is no need for concern and it should clear up in a day or so.  SYMPTOMS TO REPORT IMMEDIATELY:   Following lower endoscopy (colonoscopy or flexible sigmoidoscopy):  Excessive amounts of blood in the stool  Significant tenderness or worsening of abdominal pains  Swelling of the abdomen that is new, acute  Fever of 100F or higher  For urgent or emergent issues, a gastroenterologist can be reached at any hour by calling [phone].   DIET:  We do recommend a small meal at first, but then you may proceed to your regular diet.  Drink plenty of fluids but you should avoid alcoholic beverages for 24 hours.  ACTIVITY:  You should plan to take it easy for the rest of today and you should NOT DRIVE or use  heavy machinery until tomorrow (because of the sedation medicines used during the test).    FOLLOW UP: Our staff will call the number listed on your records 48-72 hours following your procedure to check on you and address any questions or concerns that you may have regarding the information given to you following your procedure. If we do not reach you, we will leave a message.  We will attempt to reach you two times.  During this call, we will ask if you have developed any symptoms of COVID 19. If you develop any symptoms (ie: fever, flu-like symptoms, shortness of breath, cough etc.) before then, please call [phone].  If you test positive for Covid 19 in the 2 weeks post procedure, please call and report this information to [place].    If any biopsies were taken you will be contacted by phone or by letter within the next 1-3 weeks.  Please call us at [phone] if you have not heard about the biopsies in 3 weeks.    SIGNATURES/CONFIDENTIALITY: You and/or your care partner have signed paperwork which will be entered into your electronic medical record.  These signatures attest to the fact that that the information above on your After Visit Summary has been reviewed and is understood.  Full responsibility of the confidentiality of this discharge information lies with you and/or your care-partner.

## 2019-06-29 NOTE — Progress Notes (Signed)
Pt's states no medical or surgical changes since previsit or office visit. 

## 2019-06-29 NOTE — Telephone Encounter (Signed)
I spoke to the patient's daughter by phone and we discussed the flex sig results. Questions answered They will call me when needed

## 2019-06-29 NOTE — Telephone Encounter (Signed)
Patient daughter Kimberly Mcgee called stated that she missed a call from you. She wanted to provided her alternate number of [phone] which is a work number and stated that she can be reached at this number until 3:30pm today.

## 2019-06-29 NOTE — Progress Notes (Signed)
Report to PACU, RN, vss, BBS= Clear.  

## 2019-07-04 ENCOUNTER — Telehealth: Payer: Self-pay | Admitting: *Deleted

## 2019-07-04 NOTE — Telephone Encounter (Signed)
Lanelle Bal I think this was supposed to go to you, if not please let me know.

## 2019-07-04 NOTE — Telephone Encounter (Signed)
  Follow up Call-  Call back number 06/29/2019  Post procedure Call Back phone  # [phone]  Permission to leave phone message Yes  Some recent data might be hidden     Patient questions:  Do you have a fever, pain , or abdominal swelling? No. Pain Score  0 *  Have you tolerated food without any problems? Yes.    Have you been able to return to your normal activities? Yes.    Do you have any questions about your discharge instructions: Diet   No. Medications  No. Follow up visit  No.  Do you have questions or concerns about your Care? Yes.    Pt states that she has been having normal bowel movements every day but today she feels pressure but is'nt able to go.  She states that she has tried twice.  She wants to know if she could take a laxative.  Please advise.    Actions: * If pain score is 4 or above: No action needed, pain <4.  Have you developed a fever since your procedure? no 2.   Have you had an respiratory symptoms (SOB or cough) since your procedure? no  3.   Have you tested positive for COVID 19 since your procedure? no  4.   Have you had any family members/close contacts diagnosed with the COVID 19 since your procedure?  no   If yes to any of these questions please route to Joylene John, RN and Alphonsa Gin, Therapist, sports.

## 2019-07-04 NOTE — Telephone Encounter (Signed)
Spoke with patient and explained to her that it was normal for her to not have a BM everyday.  She states that she was able to move her bowels but didn't feel like she completely evacuated her bowels.  I encouraged her to continue with the [place].  Pt. Verbalized understanding.

## 2019-07-04 NOTE — Telephone Encounter (Signed)
Would not force the issue to have a BM daily Would continue with the Linzess It is okay not to have a BM daily, but most people with the use of Linzess with have BM 5-7 days per week. Would just wait and see if she has a BM later today, but do not panic.

## 2019-07-06 ENCOUNTER — Telehealth: Payer: Self-pay | Admitting: Internal Medicine

## 2019-07-06 NOTE — Telephone Encounter (Signed)
Increase Linzess to 290 mcg daily She can use MiraLAX 17 g 2 times today until she has a bowel movement If she determines she needs MiraLAX daily this is okay

## 2019-07-06 NOTE — Telephone Encounter (Signed)
Pt states she knows that Dr. Hilarie Fredrickson said not to expect to have a BM daily but she only passed a tiny bit yesterday and today she has a strong urge to have a BM and she can't pass anything. Pt reports she has no pain but a lot of pressure, bloating, she is miserable. Pt states she felt better before the procedure when she would take a laxative when she felt the pressure. Please advise.

## 2019-07-07 NOTE — Telephone Encounter (Signed)
Patient states she is already take linzess 184mcg 2 daily. She will add the miralax as Dr. Hilarie Fredrickson recommended.Marland Kitchen

## 2019-07-11 ENCOUNTER — Telehealth: Payer: Self-pay | Admitting: Internal Medicine

## 2019-07-11 ENCOUNTER — Other Ambulatory Visit: Payer: Self-pay

## 2019-07-11 DIAGNOSIS — R1011 Right upper quadrant pain: Secondary | ICD-10-CM

## 2019-07-11 DIAGNOSIS — K5900 Constipation, unspecified: Secondary | ICD-10-CM

## 2019-07-11 MED ORDER — LUBIPROSTONE 24 MCG PO CAPS: 24.0000 ug | ORAL_CAPSULE | Freq: Two times a day (BID) | ORAL | 3 refills | Status: DC

## 2019-07-11 NOTE — Telephone Encounter (Signed)
Patient daughter lou called wanting the nurse to give her a call about the patient. She stated that the patient is having some complicated problems and need to discuss with the nurse. Please return call at [phone]

## 2019-07-11 NOTE — Telephone Encounter (Signed)
Left message for pt's daughter to call back.

## 2019-07-11 NOTE — Telephone Encounter (Signed)
Prescription sent to [facility] per request.

## 2019-07-11 NOTE — Telephone Encounter (Signed)
We need to find a laxative regimen which will result in reliable BMs and not lead to overflow loose stools and fecal incontinence. I also want to ensure no other problems and thus would recommend the following: Also Linzess has not been reliable for her to this point  --CBC, CMP, thereafter I will likely repeat CT scan of the abdomen (to exclude other issues not seen by recent flex sig) --Discontinue linzess --Begin Amitiza 24 mcg BID with food (for some people this work more effective)

## 2019-07-11 NOTE — Telephone Encounter (Signed)
Patient daughter called back and stated that the wrong pharmacy was giving. She stated that the correct pharmacy is [facility] [address] [place],  [address] [phone]

## 2019-07-11 NOTE — Telephone Encounter (Signed)
Pts daughter aware, script sent to pharmacy, lab orders in epic. CT of ABD scheduled at [facility] 07/15/19@3pm , pt to arrive there at 2:45pm, be NPO after 11am, drink bottle of contrast at 2pm. Pts daughter aware of appts and pt to come for labs and to pick up contrast prior to CT appt. Contrast and instructions left up front for pt to pick up.

## 2019-07-11 NOTE — Telephone Encounter (Signed)
Pts daughter calling stating that her mother feels terrible. She started taking linzess 290 on Thursday and also took 2 doses of miralax. Daughter states she did not have much success. States she has already had to get in the shower 3 times to clean up due to fecal incontinence, soft stool. Yesterday she passed several clumps of stool. States her mothers bellly is distended but not hard. She does not feel as if she has completely emptied and has not felt that way since the procedure. PCP told her mother she has IBS, daughter wants to know if there is something else that needs to be done is she does have IBS. Please advise.

## 2019-07-12 ENCOUNTER — Telehealth: Payer: Self-pay | Admitting: Internal Medicine

## 2019-07-12 ENCOUNTER — Other Ambulatory Visit (INDEPENDENT_AMBULATORY_CARE_PROVIDER_SITE_OTHER): Payer: PPO

## 2019-07-12 DIAGNOSIS — K5900 Constipation, unspecified: Secondary | ICD-10-CM

## 2019-07-12 DIAGNOSIS — R1011 Right upper quadrant pain: Secondary | ICD-10-CM | POA: Diagnosis not present

## 2019-07-12 LAB — COMPREHENSIVE METABOLIC PANEL
ALT: 15 U/L (ref 0–35)
AST: 17 U/L (ref 0–37)
Albumin: 4.2 g/dL (ref 3.5–5.2)
Alkaline Phosphatase: 33 U/L — ABNORMAL LOW (ref 39–117)
BUN: 24 mg/dL — ABNORMAL HIGH (ref 6–23)
CO2: 25 mEq/L (ref 19–32)
Calcium: 10 mg/dL (ref 8.4–10.5)
Chloride: 96 mEq/L (ref 96–112)
Creatinine, Ser: 0.97 mg/dL (ref 0.40–1.20)
GFR: 53.75 mL/min — ABNORMAL LOW (ref >60.00)
Glucose, Bld: 191 mg/dL — ABNORMAL HIGH (ref 70–99)
Potassium: 3.8 mEq/L (ref 3.5–5.1)
Sodium: 131 mEq/L — ABNORMAL LOW (ref 135–145)
Total Bilirubin: 0.4 mg/dL (ref 0.2–1.2)
Total Protein: 7.1 g/dL (ref 6.0–8.3)

## 2019-07-12 LAB — CBC WITH DIFFERENTIAL/PLATELET
Basophils Absolute: 0.1 10*3/uL (ref 0.0–0.1)
Basophils Relative: 0.6 % (ref 0.0–3.0)
Eosinophils Absolute: 0.2 10*3/uL (ref 0.0–0.7)
Eosinophils Relative: 1.7 % (ref 0.0–5.0)
HCT: 38.1 % (ref 36.0–46.0)
Hemoglobin: 12.6 g/dL (ref 12.0–15.0)
Lymphocytes Relative: 23.9 % (ref 12.0–46.0)
Lymphs Abs: 2.7 10*3/uL (ref 0.7–4.0)
MCHC: 33.1 g/dL (ref 30.0–36.0)
MCV: 89.5 fl (ref 78.0–100.0)
Monocytes Absolute: 0.7 10*3/uL (ref 0.1–1.0)
Monocytes Relative: 6.3 % (ref 3.0–12.0)
Neutro Abs: 7.5 10*3/uL (ref 1.4–7.7)
Neutrophils Relative %: 67.5 % (ref 43.0–77.0)
Platelets: 288 10*3/uL (ref 150.0–400.0)
RBC: 4.26 Mil/uL (ref 3.87–5.11)
RDW: 14.5 % (ref 11.5–15.5)
WBC: 11.1 10*3/uL — ABNORMAL HIGH (ref 4.0–10.5)

## 2019-07-12 NOTE — Telephone Encounter (Signed)
Pt reports she took her first dose of amitiza this am and tried to come her for labs but when she got out of the car poop just ran down her leg. She did not make it in for labs, will try again this afternoon. Pt states she feels the most empty she has in a while. She wants to know if she needs to take the evening dose of the amitiza. Please advise.

## 2019-07-12 NOTE — Telephone Encounter (Signed)
Left detailed message on the answering machine for pt.

## 2019-07-12 NOTE — Telephone Encounter (Signed)
Patient daughter called wanting to speak with the nurse. She wanted to asked if the patient gallbladder can be examine as well during the schedule CT for Friday. Please call and advise

## 2019-07-12 NOTE — Telephone Encounter (Signed)
Patient called requesting to speak with the nurse.   °

## 2019-07-12 NOTE — Telephone Encounter (Signed)
Discussed with pts daughter that the CT is of the abdomen sot the gallbladder should be visualized also.

## 2019-07-12 NOTE — Telephone Encounter (Signed)
Skip the PM dose and then plan to resume BID tomorrow, I would not expect this to cause diarrhea with each dose and diarrhea is not the goal.  The goal is more normal complete bowel movements on a regular basis If diarrhea persists we will turn the dose down to 8 mcg twice daily

## 2019-07-14 ENCOUNTER — Telehealth: Payer: Self-pay | Admitting: Internal Medicine

## 2019-07-14 NOTE — Telephone Encounter (Signed)
Patient called in and just want to know if she needs to take Amitiza this morning or not. She stated she had two big blowouts last night and dont know if she still need to take the pill this morning.

## 2019-07-14 NOTE — Telephone Encounter (Signed)
The pt was advised per Dr Vena Rua recommendations to skip the dose today and resume tomorrow.  She will call back with any further concerns.

## 2019-07-14 NOTE — Telephone Encounter (Signed)
Patient called stating that she was returning call

## 2019-07-15 ENCOUNTER — Other Ambulatory Visit: Payer: Self-pay

## 2019-07-15 ENCOUNTER — Telehealth: Payer: Self-pay | Admitting: Internal Medicine

## 2019-07-15 ENCOUNTER — Ambulatory Visit (HOSPITAL_COMMUNITY)
Admission: RE | Admit: 2019-07-15 | Discharge: 2019-07-15 | Disposition: A | Discharge status: Home / Self Care | Payer: PPO | Source: Ambulatory Visit | Attending: Internal Medicine | Admitting: Internal Medicine

## 2019-07-15 ENCOUNTER — Other Ambulatory Visit: Payer: Self-pay | Admitting: Internal Medicine

## 2019-07-15 DIAGNOSIS — K228 Other specified diseases of esophagus: Secondary | ICD-10-CM | POA: Diagnosis not present

## 2019-07-15 DIAGNOSIS — K862 Cyst of pancreas: Secondary | ICD-10-CM | POA: Diagnosis not present

## 2019-07-15 DIAGNOSIS — K8020 Calculus of gallbladder without cholecystitis without obstruction: Secondary | ICD-10-CM | POA: Diagnosis not present

## 2019-07-15 DIAGNOSIS — K5900 Constipation, unspecified: Secondary | ICD-10-CM

## 2019-07-15 DIAGNOSIS — R1011 Right upper quadrant pain: Secondary | ICD-10-CM | POA: Diagnosis not present

## 2019-07-15 DIAGNOSIS — N281 Cyst of kidney, acquired: Secondary | ICD-10-CM | POA: Diagnosis not present

## 2019-07-15 MED ORDER — IOHEXOL 300 MG/ML  SOLN
100.0000 mL | Freq: Once | INTRAMUSCULAR | Status: AC | PRN
  Administered 2019-07-15: 16:00:00 100 mL via INTRAVENOUS

## 2019-07-15 NOTE — Telephone Encounter (Signed)
Called [place] back and answered her question.

## 2019-07-15 NOTE — Telephone Encounter (Signed)
Pls call Britney at [facility] CT, they need to clarify order for CT, pt is there at this moment.

## 2019-07-18 ENCOUNTER — Other Ambulatory Visit: Payer: Self-pay | Admitting: General Surgery

## 2019-08-01 ENCOUNTER — Telehealth: Payer: Self-pay | Admitting: Internal Medicine

## 2019-08-01 NOTE — Telephone Encounter (Signed)
Spoke with pt and she is aware.

## 2019-08-01 NOTE — Telephone Encounter (Signed)
Pt reports that for the last 3 days she has been having uncontrollable diarrhea. States that she is fine in the mornings but then has terrible diarrhea stools. Pt has been taking Amitiza 24mcg BID. She also wanted to let Dr. Hilarie Fredrickson know she will be having Gallbladder surgery 8/25. Please advise.

## 2019-08-01 NOTE — Telephone Encounter (Signed)
Hold amitiza for 1-2 days until diarrhea stops Then can try to restart

## 2019-08-03 ENCOUNTER — Telehealth: Payer: Self-pay | Admitting: Internal Medicine

## 2019-08-03 MED ORDER — LUBIPROSTONE 8 MCG PO CAPS: 8.0000 ug | ORAL_CAPSULE | Freq: Two times a day (BID) | ORAL | 3 refills | Status: DC

## 2019-08-03 NOTE — Telephone Encounter (Signed)
Prescription sent to pharmacy, pt aware. 

## 2019-08-03 NOTE — Telephone Encounter (Signed)
Pt held [place] for 2 days and did not have any bowel movements. She resumed the [place] today and states she has had uncontrollable diarrhea worse than ever. Please advise.

## 2019-08-03 NOTE — Telephone Encounter (Signed)
Lets reduce her to 8 mcg BID with food

## 2019-08-10 IMAGING — CT CT ABDOMEN AND PELVIS WITH CONTRAST
2 of 5 series · 15 of 46 positions shown, 17 images · IV contrast (Omni 300)
Comparison: April 11, 2018

CLINICAL DATA: Constipation. Right upper quadrant abdominal pain.
Recent sigmoidoscopy.

EXAM:
CT ABDOMEN AND PELVIS WITH CONTRAST
TECHNIQUE: Multidetector CT imaging of the abdomen and pelvis was performed
using the standard protocol following bolus administration of
intravenous contrast.
CONTRAST:  100mL OMNIPAQUE IOHEXOL 300 MG/ML  SOLN

[Series 3: a/p w/ 5mm · axial · 0.92mm/px · z∈[+816,+1166]mm · 12 of 80 slices shown, 14 images]
[im 5/80  soft-tissue]
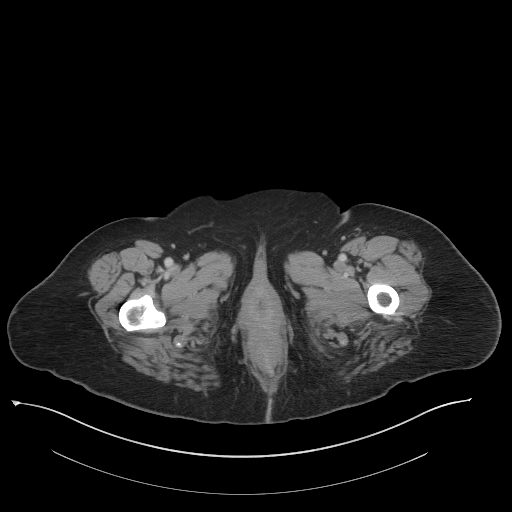
[im 5/80  bone]
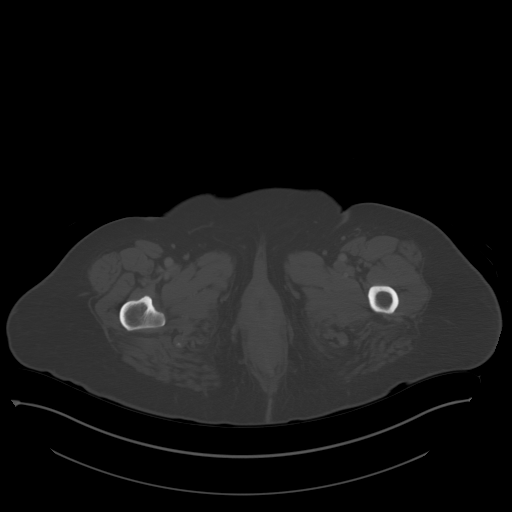
[im 14/80  soft-tissue]
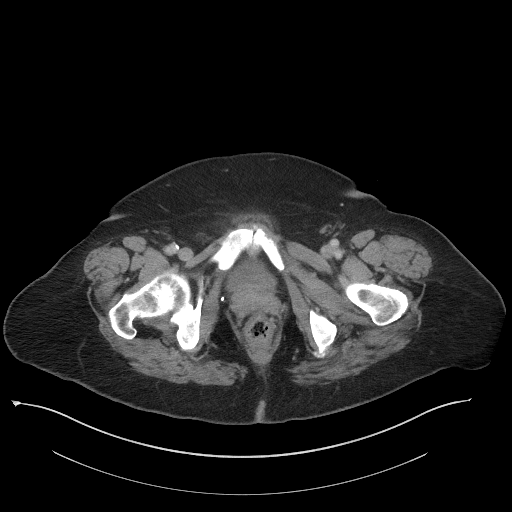
[im 18/80  soft-tissue]
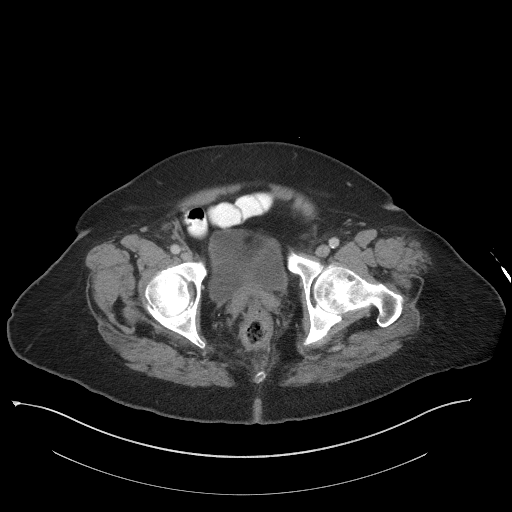
[im 22/80  soft-tissue]
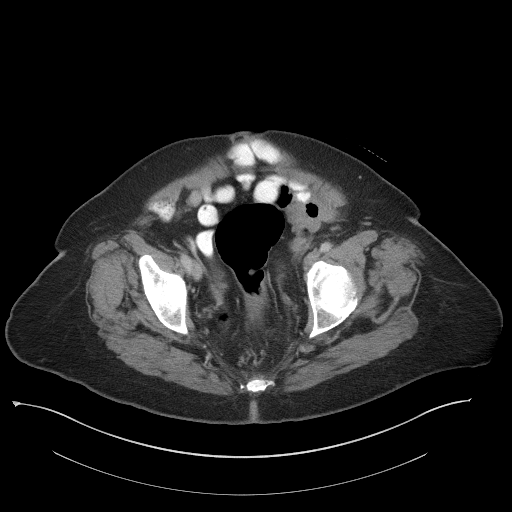
[im 31/80  soft-tissue]
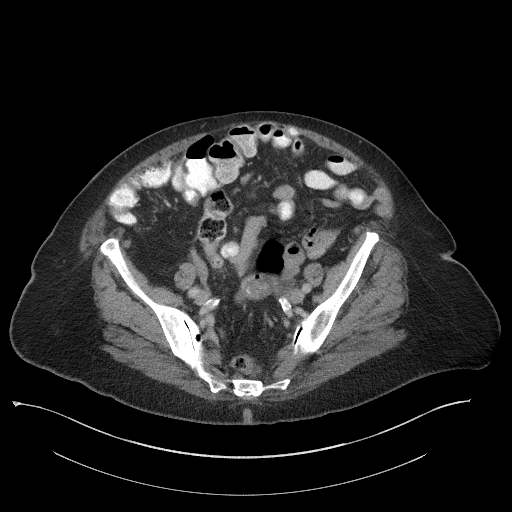
[im 36/80  soft-tissue]
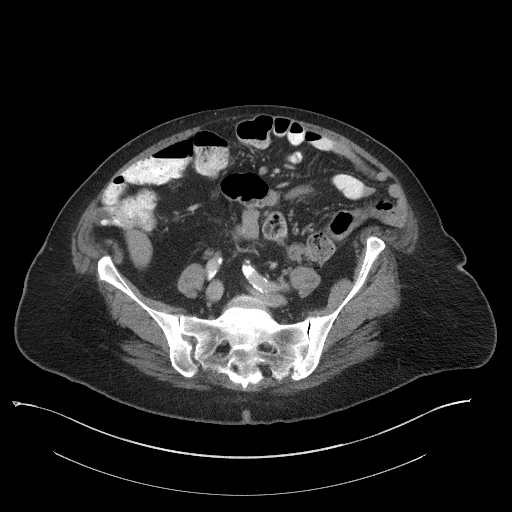
[im 44/80  soft-tissue]
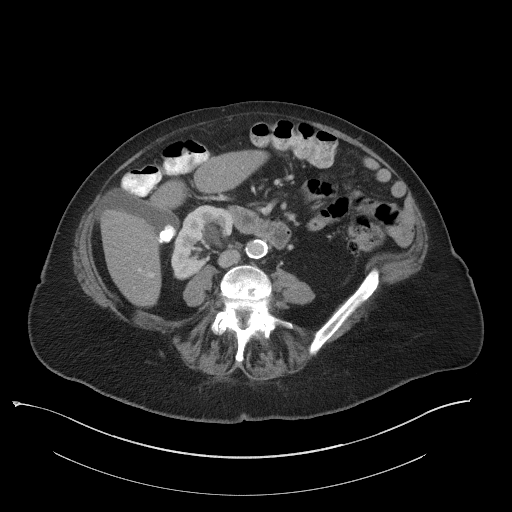
[im 49/80  soft-tissue]
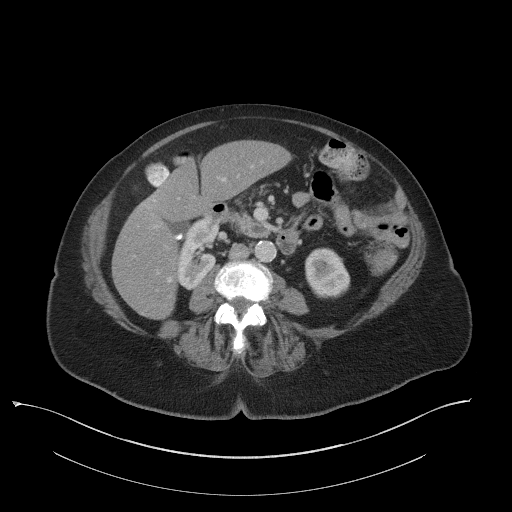
[im 58/80  soft-tissue]
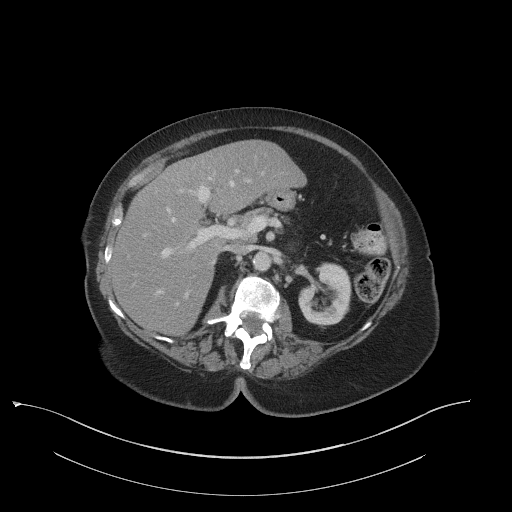
[im 58/80  bone]
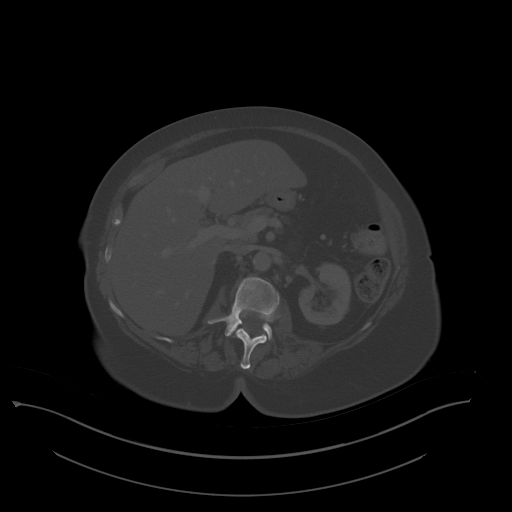
[im 62/80  soft-tissue]
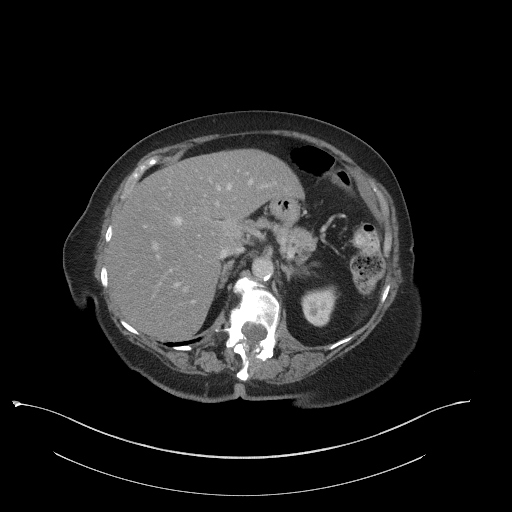
[im 66/80  soft-tissue]
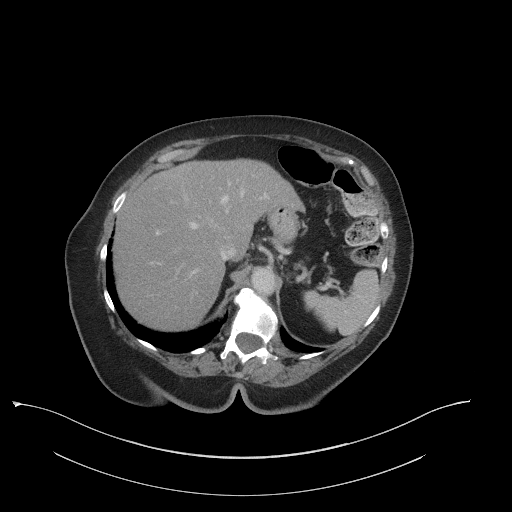
[im 75/80  soft-tissue]
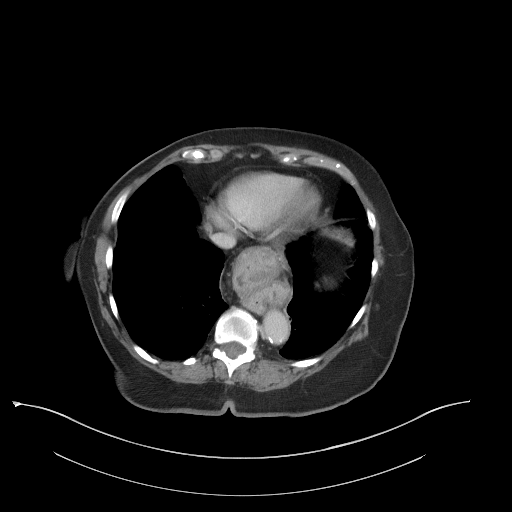

[Series 6: a/p w/ cor · coronal · 0.80mm/px · 3 of 150 slices shown]
[im 50/150  soft-tissue]
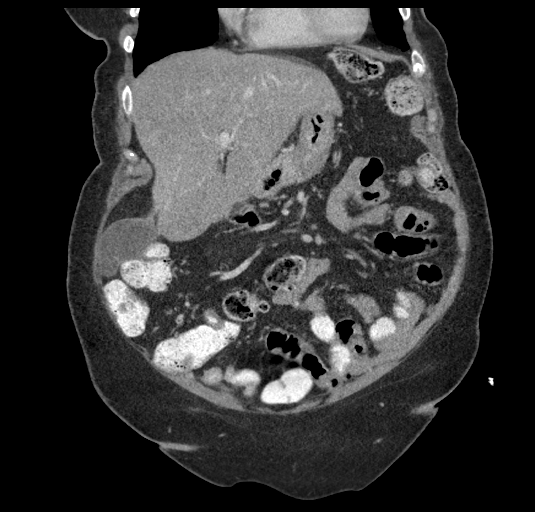
[im 67/150  soft-tissue]
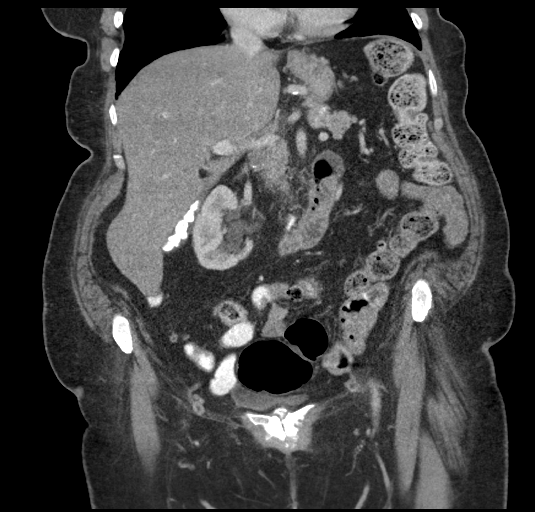
[im 83/150  soft-tissue]
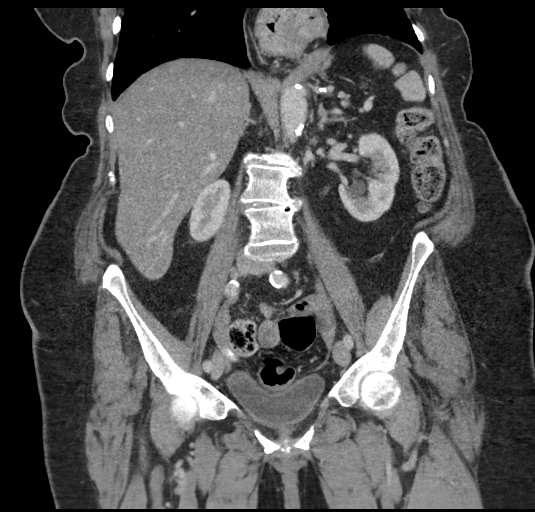

[15 of 46 positions shown; findings below may reference images not displayed]

FINDINGS: Lower chest: Moderate-sized hiatal hernia. Possible distal
esophageal wall thickening. Right coronary artery and descending
thoracic aortic atherosclerotic calcification. Stable scarring
medially in the left lower lobe.

Hepatobiliary: Multiple gallstones in the gallbladder. The liver
appears otherwise normal. No biliary dilatation.

Pancreas: 1.8 by 0.8 cm hypodense lesion in the pancreatic tail on
image [DATE], stable by my measurements from April 11, 2018, although
previously measured in a different fashion. 1.4 by 0.6 cm hypodense
lesion in the uncinate process of the pancreas on image 32/3,
likewise stable by my measurements. A third lesion in the pancreatic
body seen on the MRI April 04, 2014 is not well seen today. The 2
lesions visible are not appreciably changed over the last 5 years.

Spleen: Unremarkable

Adrenals/Urinary Tract: Small left kidney lower pole cyst, not
appreciably changed from the MRI of 5 years ago. Adrenal glands
normal.

Stomach/Bowel: 2.8 cm in diameter diverticulum the fourth portion of
the duodenum without surrounding inflammatory findings. Scattered
diverticula of the descending colon. There is some mild wall
thickening in the lower rectum circumferentially.

Vascular/Lymphatic: Aortoiliac atherosclerotic vascular disease. No
pathologic adenopathy.

Reproductive: Uterus absent.  Adnexa unremarkable.

Other: No supplemental non-categorized findings.

Musculoskeletal: Sclerosis of the pubic bodies. There 9 mm of grade
2 degenerative anterolisthesis at L4-5 and grade 1 degenerative
retrolisthesis at T12-L1 along with levoconvex upper lumbar
scoliosis. Lumbar spondylosis and degenerative disc disease noted.
IMPRESSION: 1. Mild circumferential wall thickening in the lower rectum. This
may well be incidental given the normal appearance of the region on
recent sigmoidoscopy.
2. Distal esophageal wall thickening, the most common cause of B
esophagitis. Moderate-sized hiatal hernia.
3. Cystic lesion in the pancreatic tail and separate lesion in the
uncinate process do not appear changed from the prior MRI abdomen
from 4805 given the patient's age and the stability for greater than
4 years, no further follow is required. This recommendation follows
ACR consensus guidelines: Management of Incidental Pancreatic Cysts:
A White Paper of the ACR Incidental Findings Committee. [HOSPITAL] 7329;[DATE].
4. Other imaging findings of potential clinical significance: Aortic
Atherosclerosis ([id number]). Coronary atherosclerosis.
Cholelithiasis. Diverticulum of the fourth portion of the duodenum
without inflammatory findings. Lumbar spondylosis, degenerative disc
disease, and degenerative subluxations.

## 2019-08-16 NOTE — Progress Notes (Signed)
[facility] [address] [place] [address] [place] [address] Phone: [phone] Fax: [phone] [place], [address] [facility] [address] [place] [address] Phone: [phone] Fax: [phone]    Your procedure is scheduled on Tuesday, August 25th.  Report to [facility] Main Entrance "A" at 5:30 A.M., and check in at the Admitting office.  Call this number if you have problems the morning of surgery:  [phone]  Call [phone] if you have any questions prior to your surgery date Monday-Friday 8am-4pm   Remember:  Do not eat after midnight the night before your surgery  You may drink clear liquids until 4:30 the morning of your surgery.   Clear liquids allowed are: Water, Non-Citrus Juices (without pulp), Carbonated Beverages, Clear Tea, Black Coffee Only, and Gatorade   Please complete your PRE-SURGERY ENSURE that was provided to you by 4:30 the morning of surgery.  Please, if able, drink it in one setting. DO NOT SIP.   Take these medicines the morning of surgery with A SIP OF WATER  citalopram (CELEXA)  7 days prior to surgery STOP taking any Aspirin (unless otherwise instructed by your surgeon), Aleve, Naproxen, Ibuprofen, Motrin, Advil, Goody's, BC's, all herbal medications, fish oil, and all vitamins.  Follow your surgeon's instructions on when to stop Aspirin.  If no instructions were given by your surgeon then you will need to call the office to get those instructions.     The Morning of Surgery  Do not wear jewelry, make-up or nail polish.  Do not wear lotions, powders, or perfumes/colognes, or deodorant  Do not shave 48 hours prior to surgery.    Do not bring valuables to the hospital.  [facility] is not responsible for any belongings or valuables.  If you are a smoker, DO NOT Smoke 24 hours prior to surgery IF you wear a CPAP at night please bring your mask, tubing, and machine the  morning of surgery   Remember that you must have someone to transport you home after your surgery, and remain with you for 24 hours if you are discharged the same day.  Contacts, glasses, hearing aids, dentures or bridgework may not be worn into surgery.   Leave your suitcase in the car.  After surgery it may be brought to your room.  For patients admitted to the hospital, discharge time will be determined by your treatment team.  Patients discharged the day of surgery will not be allowed to drive home.   Special instructions:   [place]- Preparing For Surgery  Before surgery, you can play an important role. Because skin is not sterile, your skin needs to be as free of germs as possible. You can reduce the number of germs on your skin by washing with CHG (chlorahexidine gluconate) Soap before surgery.  CHG is an antiseptic cleaner which kills germs and bonds with the skin to continue killing germs even after washing.    Oral Hygiene is also important to reduce your risk of infection.  Remember - BRUSH YOUR TEETH THE MORNING OF SURGERY WITH YOUR REGULAR TOOTHPASTE  Please do not use if you have an allergy to CHG or antibacterial soaps. If your skin becomes reddened/irritated stop using the CHG.  Do not shave (including legs and underarms) for at least 48 hours prior to first CHG shower. It is OK to shave your face.  Please follow these instructions carefully.   1. Shower  the NIGHT BEFORE SURGERY and the MORNING OF SURGERY with CHG Soap.   2. If you chose to wash your hair, wash your hair first as usual with your normal shampoo.  3. After you shampoo, rinse your hair and body thoroughly to remove the shampoo.  4. Use CHG as you would any other liquid soap. You can apply CHG directly to the skin and wash gently with a scrungie or a clean washcloth.   5. Apply the CHG Soap to your body ONLY FROM THE NECK DOWN.  Do not use on open wounds or open sores. Avoid contact with your eyes,  ears, mouth and genitals (private parts). Wash Face and genitals (private parts)  with your normal soap.   6. Wash thoroughly, paying special attention to the area where your surgery will be performed.  7. Thoroughly rinse your body with warm water from the neck down.  8. DO NOT shower/wash with your normal soap after using and rinsing off the CHG Soap.  9. Pat yourself dry with a CLEAN TOWEL.  10. Wear CLEAN PAJAMAS to bed the night before surgery, wear comfortable clothes the morning of surgery  11. Place CLEAN SHEETS on your bed the night of your first shower and DO NOT SLEEP WITH PETS.  Day of Surgery: Do not apply any deodorants/lotions. Please shower the morning of surgery with the CHG soap  Please wear clean clothes to the hospital/surgery center.   Remember to brush your teeth WITH YOUR REGULAR TOOTHPASTE.  Please read over the following fact sheets that you were given.

## 2019-08-17 ENCOUNTER — Other Ambulatory Visit: Payer: Self-pay

## 2019-08-17 ENCOUNTER — Encounter (HOSPITAL_COMMUNITY): Payer: Self-pay

## 2019-08-17 ENCOUNTER — Encounter (HOSPITAL_COMMUNITY)
Admission: RE | Admit: 2019-08-17 | Discharge: 2019-08-17 | Disposition: A | Discharge status: Home / Self Care | Payer: PPO | Source: Ambulatory Visit | Attending: General Surgery | Admitting: General Surgery

## 2019-08-17 DIAGNOSIS — K8020 Calculus of gallbladder without cholecystitis without obstruction: Secondary | ICD-10-CM | POA: Insufficient documentation

## 2019-08-17 DIAGNOSIS — Z79899 Other long term (current) drug therapy: Secondary | ICD-10-CM | POA: Diagnosis not present

## 2019-08-17 DIAGNOSIS — Z20828 Contact with and (suspected) exposure to other viral communicable diseases: Secondary | ICD-10-CM | POA: Insufficient documentation

## 2019-08-17 DIAGNOSIS — Z01818 Encounter for other preprocedural examination: Secondary | ICD-10-CM | POA: Insufficient documentation

## 2019-08-17 DIAGNOSIS — I10 Essential (primary) hypertension: Secondary | ICD-10-CM | POA: Diagnosis not present

## 2019-08-17 DIAGNOSIS — Z7982 Long term (current) use of aspirin: Secondary | ICD-10-CM | POA: Insufficient documentation

## 2019-08-17 HISTORY — DX: Unspecified osteoarthritis, unspecified site: M19.90

## 2019-08-17 LAB — COMPREHENSIVE METABOLIC PANEL
ALT: 19 U/L (ref 0–44)
AST: 20 U/L (ref 15–41)
Albumin: 3.6 g/dL (ref 3.5–5.0)
Alkaline Phosphatase: 31 U/L — ABNORMAL LOW (ref 38–126)
Anion gap: 9 (ref 5–15)
BUN: 17 mg/dL (ref 8–23)
CO2: 23 mmol/L (ref 22–32)
Calcium: 9.5 mg/dL (ref 8.9–10.3)
Chloride: 104 mmol/L (ref 98–111)
Creatinine, Ser: 0.83 mg/dL (ref 0.44–1.00)
GFR calc Af Amer: >60 mL/min (ref >60)
GFR calc non Af Amer: >60 mL/min (ref >60)
Glucose, Bld: 163 mg/dL — ABNORMAL HIGH (ref 70–99)
Potassium: 3.7 mmol/L (ref 3.5–5.1)
Sodium: 136 mmol/L (ref 135–145)
Total Bilirubin: 0.7 mg/dL (ref 0.3–1.2)
Total Protein: 6.7 g/dL (ref 6.5–8.1)

## 2019-08-17 LAB — CBC
HCT: 39.4 % (ref 36.0–46.0)
Hemoglobin: 12.7 g/dL (ref 12.0–15.0)
MCH: 30 pg (ref 26.0–34.0)
MCHC: 32.2 g/dL (ref 30.0–36.0)
MCV: 93.1 fL (ref 80.0–100.0)
Platelets: 267 10*3/uL (ref 150–400)
RBC: 4.23 MIL/uL (ref 3.87–5.11)
RDW: 14.6 % (ref 11.5–15.5)
WBC: 8.6 10*3/uL (ref 4.0–10.5)
nRBC: 0 % (ref 0.0–0.2)

## 2019-08-17 NOTE — Progress Notes (Signed)
PCP - Shon Baton Cardiologist - na  Chest x-ray - na EKG - today Stress Test - na ECHO - na Cardiac Cath - na  Sleep Study - na CPAP -   Fasting Blood Sugar - na Checks Blood Sugar _____ times a day  Blood Thinner Instructions: Aspirin Instructions: pt. Stopped 2 weeks ago  Anesthesia review:   Patient denies shortness of breath, fever, cough and chest pain at PAT appointment   Patient verbalized understanding of instructions that were given to them at the PAT appointment. Patient was also instructed that they will need to review over the PAT instructions again at home before surgery.

## 2019-08-18 ENCOUNTER — Telehealth: Payer: Self-pay | Admitting: Internal Medicine

## 2019-08-18 NOTE — Telephone Encounter (Signed)
Spoke with pt and let her know to keep taking the Amitiza as prescribed. Pt states it is working so much better than the linzess did.

## 2019-08-18 NOTE — Progress Notes (Addendum)
Anesthesia Chart Review:  Case: [id number] Date/Time: 08/23/19 1045   Procedure: LAPAROSCOPIC CHOLECYSTECTOMY (N/A ) - GENERAL WITH TAP BLOCK BILATERALLY   Anesthesia type: General   Pre-op diagnosis: GALLSTONES   Location: [facility] OR ROOM 05 / [place] OR   Surgeon: Rolm Bookbinder, MD      DISCUSSION: Patient is a 85 year old female scheduled for the above procedure.  History includes never smoker, hypertension, bladder prolapse.  She was evaluated by Dr. Donne Hazel on 07/15/19. He felt "she clearly has symptomatic cholelithiasis." He discussed observation verus surgery, particularly given her age (although still "very healthy, active 86 yo who lives independently"). He discussed risks of post-operative cognitive effects, but ultimately they felt "risk of surgery is less than risk of continued gb attacks and more complicated disease". He is planning to keep patient overnight.   Preoperative EKG showed possible loss of r wave in V2 (versus lead placement) since 2018. She denied SOB, chest pain, cough, fever at PAT RN visit. Will attempt to contact her (only voice mail on 08/18/19 attempt) to discuss activity tolerance and any symptoms, but if she stays relatively active without CV symptoms then it is anticipated that she can proceed as planned. Discussed with anesthesiologist Roberts Gaudy, MD. (UPDATE 08/19/19 3:34 PM: I spoke with patient. She denied chest pain, SOB, edema, orthopnea. She stays very active with friends, family, shopping, yard work. She is also able to do light house cleaning. She still drives to [facility] to visit with great grand children. No known cardiac or pulmonary issues. No known DM--non-fasting glucose 163. No known anesthesia complications. As above, if no acute changes then I would anticipate that she can proceed as planned.)   ASA has been on hold for 2 weeks. Presurgical COVID-19 test is scheduled for 08/19/19.     VS: BP (!) (P) 147/93   Pulse (P) 71   Temp (P) 36.7 C   Resp (P) 18    Ht (P) 5\' 1"  (1.549 m)   Wt (P) 74.3 kg   SpO2 (P) 97%   BMI (P) 30.93 kg/m    PROVIDERS: Shon Baton, MD is PCP  Zenovia Jarred, MD is GI   LABS: Labs reviewed: Acceptable for surgery. (all labs ordered are listed, but only abnormal results are displayed)  Labs Reviewed  COMPREHENSIVE METABOLIC PANEL - Abnormal; Notable for the following components:      Result Value   Glucose, Bld 163 (*)    Alkaline Phosphatase 31 (*)    All other components within normal limits  CBC     IMAGES: CT abd/pelvis 07/15/19: IMPRESSION: 1. Mild circumferential wall thickening in the lower rectum. This may well be incidental given the normal appearance of the region on recent sigmoidoscopy. 2. Distal esophageal wall thickening, the most common cause of B esophagitis. Moderate-sized hiatal hernia. 3. Cystic lesion in the pancreatic tail and separate lesion in the uncinate process do not appear changed from the prior MRI abdomen from 2015 given the patient's age and the stability for greater than 4 years, no further follow is required. This recommendation follows ACR consensus guidelines: Management of Incidental Pancreatic Cysts: A White Paper of the ACR Incidental Findings Committee. J Am Coll Radiol 2353;61:443-154. 4. Other imaging findings of potential clinical significance: Aortic Atherosclerosis (ICD10-I70.0). Coronary atherosclerosis. Cholelithiasis. Diverticulum of the fourth portion of the duodenum without inflammatory findings. Lumbar spondylosis, degenerative disc disease, and degenerative subluxations.   EKG: 08/17/19: Normal sinus rhythm consider Septal infarct , age undetermined Borderline ECG Confirmed  by Ida Rogue [phone]) on 08/17/2019 3:00:00 PM   CV: N/A   Past Medical History:  Diagnosis Date  . Arthritis    right knee  . Bladder prolapse, female, acquired   . Gallstones   . Hypertension     Past Surgical History:  Procedure Laterality Date  .  ABDOMINAL HYSTERECTOMY    . HEMORROIDECTOMY     x 2  . KNEE SURGERY      MEDICATIONS: . aspirin EC 81 MG tablet  . Calcium Carbonate-Vitamin D (CALCIUM 600 + D PO)  . citalopram (CELEXA) 20 MG tablet  . losartan-hydrochlorothiazide (HYZAAR) 100-25 MG per tablet  . lubiprostone (AMITIZA) 8 MCG capsule  . Melatonin 5 MG TABS  . Misc Natural Products (COSAMIN ASU ADVANCED FORMULA PO)  . Multiple Vitamins-Minerals (ONE-A-DAY WOMENS 50 PLUS PO)  . naproxen sodium (ALEVE) 220 MG tablet  . Omega-3 Fatty Acids (FISH OIL PO)   No current facility-administered medications for this encounter.     Myra Gianotti, PA-C Surgical Short Stay/Anesthesiology [facility] Phone [phone] [facility] Phone [phone] 08/18/2019 5:04 PM

## 2019-08-18 NOTE — Telephone Encounter (Signed)
Left message for pt to call back  °

## 2019-08-18 NOTE — Anesthesia Preprocedure Evaluation (Addendum)
Anesthesia Evaluation  Patient identified by MRN, date of birth, ID band Patient awake    Reviewed: Allergy & Precautions, NPO status , Patient's Chart, lab work & pertinent test results  Airway Mallampati: II  TM Distance: >3 FB Neck ROM: Full    Dental  (+) Teeth Intact, Dental Advisory Given   Pulmonary    breath sounds clear to auscultation       Cardiovascular hypertension,  Rhythm:Regular Rate:Normal     Neuro/Psych    GI/Hepatic   Endo/Other    Renal/GU      Musculoskeletal   Abdominal   Peds  Hematology   Anesthesia Other Findings   Reproductive/Obstetrics                            Anesthesia Physical Anesthesia Plan  ASA: III  Anesthesia Plan: General   Post-op Pain Management:    Induction: Intravenous  PONV Risk Score and Plan: Ondansetron and Dexamethasone  Airway Management Planned: Oral ETT  Additional Equipment:   Intra-op Plan:   Post-operative Plan: Extubation in OR  Informed Consent: I have reviewed the patients History and Physical, chart, labs and discussed the procedure including the risks, benefits and alternatives for the proposed anesthesia with the patient or authorized representative who has indicated his/her understanding and acceptance.     Dental advisory given  Plan Discussed with: CRNA and Anesthesiologist  Anesthesia Plan Comments: (PAT note written Myra Gianotti, PA-C. )       Anesthesia Quick Evaluation

## 2019-08-19 ENCOUNTER — Other Ambulatory Visit (HOSPITAL_COMMUNITY)
Admission: RE | Admit: 2019-08-19 | Discharge: 2019-08-19 | Disposition: A | Discharge status: Home / Self Care | Payer: PPO | Source: Ambulatory Visit | Attending: General Surgery | Admitting: General Surgery

## 2019-08-19 DIAGNOSIS — Z01818 Encounter for other preprocedural examination: Secondary | ICD-10-CM | POA: Diagnosis not present

## 2019-08-19 LAB — SARS CORONAVIRUS 2 (TAT 6-24 HRS): SARS Coronavirus 2: NEGATIVE

## 2019-08-23 ENCOUNTER — Observation Stay (HOSPITAL_COMMUNITY)
Admission: AD | Admit: 2019-08-23 | Discharge: 2019-08-24 | Disposition: A | Discharge status: Home / Self Care | Payer: PPO | Attending: General Surgery | Admitting: General Surgery

## 2019-08-23 ENCOUNTER — Encounter (HOSPITAL_COMMUNITY)
Admission: AD | Disposition: A | Discharge status: Home / Self Care | Payer: Self-pay | Source: Home / Self Care | Attending: General Surgery

## 2019-08-23 ENCOUNTER — Other Ambulatory Visit: Payer: Self-pay

## 2019-08-23 ENCOUNTER — Encounter (HOSPITAL_COMMUNITY): Payer: Self-pay

## 2019-08-23 ENCOUNTER — Ambulatory Visit (HOSPITAL_COMMUNITY): Payer: PPO | Admitting: Anesthesiology

## 2019-08-23 ENCOUNTER — Ambulatory Visit (HOSPITAL_COMMUNITY): Payer: PPO | Admitting: Vascular Surgery

## 2019-08-23 DIAGNOSIS — K8010 Calculus of gallbladder with chronic cholecystitis without obstruction: Principal | ICD-10-CM | POA: Insufficient documentation

## 2019-08-23 DIAGNOSIS — K8020 Calculus of gallbladder without cholecystitis without obstruction: Secondary | ICD-10-CM | POA: Diagnosis not present

## 2019-08-23 DIAGNOSIS — Z79899 Other long term (current) drug therapy: Secondary | ICD-10-CM | POA: Insufficient documentation

## 2019-08-23 DIAGNOSIS — R2681 Unsteadiness on feet: Secondary | ICD-10-CM | POA: Insufficient documentation

## 2019-08-23 DIAGNOSIS — G9340 Encephalopathy, unspecified: Secondary | ICD-10-CM | POA: Diagnosis not present

## 2019-08-23 DIAGNOSIS — I10 Essential (primary) hypertension: Secondary | ICD-10-CM | POA: Diagnosis not present

## 2019-08-23 DIAGNOSIS — Z9049 Acquired absence of other specified parts of digestive tract: Secondary | ICD-10-CM

## 2019-08-23 HISTORY — PX: CHOLECYSTECTOMY: SHX55

## 2019-08-23 SURGERY — LAPAROSCOPIC CHOLECYSTECTOMY
Anesthesia: General | Site: Abdomen

## 2019-08-23 MED ORDER — BUPIVACAINE-EPINEPHRINE 0.25% -1:200000 IJ SOLN
INTRAMUSCULAR | Status: DC | PRN
  Administered 2019-08-23: 10 mL

## 2019-08-23 MED ORDER — ONDANSETRON HCL 4 MG/2ML IJ SOLN: 4.0000 mg | Freq: Once | INTRAMUSCULAR | Status: DC | PRN

## 2019-08-23 MED ORDER — GLYCOPYRROLATE PF 0.2 MG/ML IJ SOSY
PREFILLED_SYRINGE | INTRAMUSCULAR | Status: AC
  Filled 2019-08-23: qty 1

## 2019-08-23 MED ORDER — SIMETHICONE 80 MG PO CHEW: 40.0000 mg | CHEWABLE_TABLET | Freq: Four times a day (QID) | ORAL | Status: DC | PRN

## 2019-08-23 MED ORDER — PHENYLEPHRINE 40 MCG/ML (10ML) SYRINGE FOR IV PUSH (FOR BLOOD PRESSURE SUPPORT)
PREFILLED_SYRINGE | INTRAVENOUS | Status: AC
  Filled 2019-08-23: qty 10

## 2019-08-23 MED ORDER — ONDANSETRON HCL 4 MG/2ML IJ SOLN
INTRAMUSCULAR | Status: DC | PRN
  Administered 2019-08-23: 4 mg via INTRAVENOUS

## 2019-08-23 MED ORDER — ONDANSETRON HCL 4 MG/2ML IJ SOLN
INTRAMUSCULAR | Status: AC
  Filled 2019-08-23: qty 4

## 2019-08-23 MED ORDER — HYDRALAZINE HCL 20 MG/ML IJ SOLN
INTRAMUSCULAR | Status: DC | PRN
  Administered 2019-08-23 (×2): 5 mg via INTRAVENOUS

## 2019-08-23 MED ORDER — SUCCINYLCHOLINE CHLORIDE 200 MG/10ML IV SOSY
PREFILLED_SYRINGE | INTRAVENOUS | Status: AC
  Filled 2019-08-23: qty 20

## 2019-08-23 MED ORDER — EPHEDRINE 5 MG/ML INJ
INTRAVENOUS | Status: AC
  Filled 2019-08-23: qty 20

## 2019-08-23 MED ORDER — ACETAMINOPHEN 500 MG PO TABS
1000.0000 mg | ORAL_TABLET | ORAL | Status: AC
  Administered 2019-08-23: 10:00:00 1000 mg via ORAL

## 2019-08-23 MED ORDER — CEFAZOLIN SODIUM-DEXTROSE 2-4 GM/100ML-% IV SOLN
2.0000 g | INTRAVENOUS | Status: AC
  Administered 2019-08-23: 11:00:00 2 g via INTRAVENOUS
  Filled 2019-08-23: qty 100

## 2019-08-23 MED ORDER — BUPIVACAINE-EPINEPHRINE (PF) 0.25% -1:200000 IJ SOLN
INTRAMUSCULAR | Status: AC
  Filled 2019-08-23: qty 30

## 2019-08-23 MED ORDER — LOSARTAN POTASSIUM 50 MG PO TABS
100.0000 mg | ORAL_TABLET | Freq: Every day | ORAL | Status: DC
  Administered 2019-08-24: 08:00:00 100 mg via ORAL
  Filled 2019-08-23: qty 2

## 2019-08-23 MED ORDER — EPHEDRINE SULFATE-NACL 50-0.9 MG/10ML-% IV SOSY
PREFILLED_SYRINGE | INTRAVENOUS | Status: DC | PRN
  Administered 2019-08-23: 10 mg via INTRAVENOUS

## 2019-08-23 MED ORDER — LUBIPROSTONE 8 MCG PO CAPS
8.0000 ug | ORAL_CAPSULE | Freq: Two times a day (BID) | ORAL | Status: DC
  Administered 2019-08-24: 8 ug via ORAL
  Filled 2019-08-23 (×3): qty 1

## 2019-08-23 MED ORDER — ACETAMINOPHEN 325 MG PO TABS
650.0000 mg | ORAL_TABLET | Freq: Four times a day (QID) | ORAL | Status: DC | PRN
  Administered 2019-08-23: 650 mg via ORAL
  Filled 2019-08-23: qty 2

## 2019-08-23 MED ORDER — STERILE WATER FOR IRRIGATION IR SOLN
Status: DC | PRN
  Administered 2019-08-23: 1000 mL

## 2019-08-23 MED ORDER — FENTANYL CITRATE (PF) 100 MCG/2ML IJ SOLN
25.0000 ug | INTRAMUSCULAR | Status: DC | PRN
  Administered 2019-08-23 (×4): 25 ug via INTRAVENOUS

## 2019-08-23 MED ORDER — PROPOFOL 10 MG/ML IV BOLUS
INTRAVENOUS | Status: AC
  Filled 2019-08-23: qty 20

## 2019-08-23 MED ORDER — HYDROCHLOROTHIAZIDE 25 MG PO TABS
25.0000 mg | ORAL_TABLET | Freq: Every day | ORAL | Status: DC
  Administered 2019-08-24: 25 mg via ORAL
  Filled 2019-08-23: qty 1

## 2019-08-23 MED ORDER — OXYCODONE HCL 5 MG PO TABS
5.0000 mg | ORAL_TABLET | ORAL | Status: DC | PRN
  Administered 2019-08-23 – 2019-08-24 (×5): 5 mg via ORAL
  Filled 2019-08-23 (×5): qty 1

## 2019-08-23 MED ORDER — LIDOCAINE 2% (20 MG/ML) 5 ML SYRINGE
INTRAMUSCULAR | Status: DC | PRN
  Administered 2019-08-23: 40 mg via INTRAVENOUS

## 2019-08-23 MED ORDER — PROPOFOL 10 MG/ML IV BOLUS
INTRAVENOUS | Status: DC | PRN
  Administered 2019-08-23: 140 mg via INTRAVENOUS
  Administered 2019-08-23: 30 mg via INTRAVENOUS

## 2019-08-23 MED ORDER — DEXAMETHASONE SODIUM PHOSPHATE 10 MG/ML IJ SOLN
INTRAMUSCULAR | Status: DC | PRN
  Administered 2019-08-23: 8 mg via INTRAVENOUS

## 2019-08-23 MED ORDER — SUGAMMADEX SODIUM 200 MG/2ML IV SOLN
INTRAVENOUS | Status: DC | PRN
  Administered 2019-08-23: 300 mg via INTRAVENOUS

## 2019-08-23 MED ORDER — SODIUM CHLORIDE 0.9 % IR SOLN
Status: DC | PRN
  Administered 2019-08-23: 1000 mL

## 2019-08-23 MED ORDER — FENTANYL CITRATE (PF) 250 MCG/5ML IJ SOLN
INTRAMUSCULAR | Status: AC
  Filled 2019-08-23: qty 5

## 2019-08-23 MED ORDER — ENSURE PRE-SURGERY PO LIQD: 296.0000 mL | Freq: Once | ORAL | Status: DC

## 2019-08-23 MED ORDER — LOSARTAN POTASSIUM-HCTZ 100-25 MG PO TABS: 1.0000 | ORAL_TABLET | Freq: Every day | ORAL | Status: DC

## 2019-08-23 MED ORDER — DEXAMETHASONE SODIUM PHOSPHATE 10 MG/ML IJ SOLN
INTRAMUSCULAR | Status: AC
  Filled 2019-08-23: qty 2

## 2019-08-23 MED ORDER — CITALOPRAM HYDROBROMIDE 20 MG PO TABS
20.0000 mg | ORAL_TABLET | Freq: Every day | ORAL | Status: DC
  Administered 2019-08-24: 20 mg via ORAL
  Filled 2019-08-23: qty 1

## 2019-08-23 MED ORDER — ONDANSETRON HCL 4 MG/2ML IJ SOLN: 4.0000 mg | Freq: Four times a day (QID) | INTRAMUSCULAR | Status: DC | PRN

## 2019-08-23 MED ORDER — 0.9 % SODIUM CHLORIDE (POUR BTL) OPTIME
TOPICAL | Status: DC | PRN
  Administered 2019-08-23: 11:00:00 1000 mL

## 2019-08-23 MED ORDER — TRAMADOL HCL 50 MG PO TABS
50.0000 mg | ORAL_TABLET | Freq: Four times a day (QID) | ORAL | Status: DC | PRN
  Administered 2019-08-23: 50 mg via ORAL
  Filled 2019-08-23: qty 1

## 2019-08-23 MED ORDER — FENTANYL CITRATE (PF) 100 MCG/2ML IJ SOLN
INTRAMUSCULAR | Status: AC
  Filled 2019-08-23: qty 2

## 2019-08-23 MED ORDER — TRAMADOL HCL 50 MG PO TABS
ORAL_TABLET | ORAL | Status: AC
  Filled 2019-08-23: qty 1

## 2019-08-23 MED ORDER — ACETAMINOPHEN 650 MG RE SUPP: 650.0000 mg | Freq: Four times a day (QID) | RECTAL | Status: DC | PRN

## 2019-08-23 MED ORDER — ONDANSETRON 4 MG PO TBDP: 4.0000 mg | ORAL_TABLET | Freq: Four times a day (QID) | ORAL | Status: DC | PRN

## 2019-08-23 MED ORDER — ROCURONIUM BROMIDE 10 MG/ML (PF) SYRINGE
PREFILLED_SYRINGE | INTRAVENOUS | Status: DC | PRN
  Administered 2019-08-23: 50 mg via INTRAVENOUS

## 2019-08-23 MED ORDER — ENOXAPARIN SODIUM 40 MG/0.4ML ~~LOC~~ SOLN
40.0000 mg | SUBCUTANEOUS | Status: DC
  Administered 2019-08-23: 40 mg via SUBCUTANEOUS
  Filled 2019-08-23: qty 0.4

## 2019-08-23 MED ORDER — FENTANYL CITRATE (PF) 250 MCG/5ML IJ SOLN
INTRAMUSCULAR | Status: DC | PRN
  Administered 2019-08-23: 25 ug via INTRAVENOUS
  Administered 2019-08-23: 75 ug via INTRAVENOUS
  Administered 2019-08-23: 50 ug via INTRAVENOUS

## 2019-08-23 MED ORDER — ROCURONIUM BROMIDE 10 MG/ML (PF) SYRINGE
PREFILLED_SYRINGE | INTRAVENOUS | Status: AC
  Filled 2019-08-23: qty 20

## 2019-08-23 MED ORDER — LIDOCAINE 2% (20 MG/ML) 5 ML SYRINGE
INTRAMUSCULAR | Status: AC
  Filled 2019-08-23: qty 10

## 2019-08-23 MED ORDER — ACETAMINOPHEN 500 MG PO TABS
ORAL_TABLET | ORAL | Status: AC
  Administered 2019-08-23: 1000 mg via ORAL
  Filled 2019-08-23: qty 2

## 2019-08-23 MED ORDER — LACTATED RINGERS IV SOLN
INTRAVENOUS | Status: DC
  Administered 2019-08-23: 10:00:00 via INTRAVENOUS

## 2019-08-23 SURGICAL SUPPLY — 42 items
ADH SKN CLS APL DERMABOND .7 (GAUZE/BANDAGES/DRESSINGS) ×1
APL PRP STRL LF DISP 70% ISPRP (MISCELLANEOUS) ×1
APPLIER CLIP 5 13 M/L LIGAMAX5 (MISCELLANEOUS) ×3
APR CLP MED LRG 5 ANG JAW (MISCELLANEOUS) ×1
BAG SPEC RTRVL 10 TROC 200 (ENDOMECHANICALS) ×1
BLADE CLIPPER SURG (BLADE) IMPLANT
CANISTER SUCT 3000ML PPV (MISCELLANEOUS) ×3 IMPLANT
CHLORAPREP W/TINT 26 (MISCELLANEOUS) ×3 IMPLANT
CLIP APPLIE 5 13 M/L LIGAMAX5 (MISCELLANEOUS) ×1 IMPLANT
CLOSURE WOUND 1/2 X4 (GAUZE/BANDAGES/DRESSINGS) ×1
COVER SURGICAL LIGHT HANDLE (MISCELLANEOUS) ×3 IMPLANT
COVER WAND RF STERILE (DRAPES) ×1 IMPLANT
DERMABOND ADVANCED (GAUZE/BANDAGES/DRESSINGS) ×2
DERMABOND ADVANCED .7 DNX12 (GAUZE/BANDAGES/DRESSINGS) ×1 IMPLANT
ELECT REM PT RETURN 9FT ADLT (ELECTROSURGICAL) ×3
ELECTRODE REM PT RTRN 9FT ADLT (ELECTROSURGICAL) ×1 IMPLANT
GLOVE BIO SURGEON STRL SZ7 (GLOVE) ×5 IMPLANT
GLOVE BIOGEL PI IND STRL 7.5 (GLOVE) ×1 IMPLANT
GLOVE BIOGEL PI INDICATOR 7.5 (GLOVE) ×2
GOWN STRL REUS W/ TWL LRG LVL3 (GOWN DISPOSABLE) ×3 IMPLANT
GOWN STRL REUS W/TWL LRG LVL3 (GOWN DISPOSABLE) ×9
GRASPER SUT TROCAR 14GX15 (MISCELLANEOUS) ×3 IMPLANT
KIT BASIN OR (CUSTOM PROCEDURE TRAY) ×3 IMPLANT
KIT TURNOVER KIT B (KITS) ×3 IMPLANT
NS IRRIG 1000ML POUR BTL (IV SOLUTION) ×3 IMPLANT
PAD ARMBOARD 7.5X6 YLW CONV (MISCELLANEOUS) ×3 IMPLANT
POUCH RETRIEVAL ECOSAC 10 (ENDOMECHANICALS) ×1 IMPLANT
POUCH RETRIEVAL ECOSAC 10MM (ENDOMECHANICALS) ×2
SCISSORS LAP 5X35 DISP (ENDOMECHANICALS) ×3 IMPLANT
SET IRRIG TUBING LAPAROSCOPIC (IRRIGATION / IRRIGATOR) ×3 IMPLANT
SET TUBE SMOKE EVAC HIGH FLOW (TUBING) ×3 IMPLANT
SLEEVE ENDOPATH XCEL 5M (ENDOMECHANICALS) ×6 IMPLANT
SPECIMEN JAR SMALL (MISCELLANEOUS) ×3 IMPLANT
STRIP CLOSURE SKIN 1/2X4 (GAUZE/BANDAGES/DRESSINGS) ×2 IMPLANT
SUT MNCRL AB 4-0 PS2 18 (SUTURE) ×3 IMPLANT
SUT VICRYL 0 UR6 27IN ABS (SUTURE) ×3 IMPLANT
TOWEL GREEN STERILE (TOWEL DISPOSABLE) ×3 IMPLANT
TOWEL GREEN STERILE FF (TOWEL DISPOSABLE) ×3 IMPLANT
TRAY LAPAROSCOPIC MC (CUSTOM PROCEDURE TRAY) ×3 IMPLANT
TROCAR XCEL BLUNT TIP 100MML (ENDOMECHANICALS) ×3 IMPLANT
TROCAR XCEL NON-BLD 5MMX100MML (ENDOMECHANICALS) ×3 IMPLANT
WATER STERILE IRR 1000ML POUR (IV SOLUTION) ×3 IMPLANT

## 2019-08-23 NOTE — Op Note (Signed)
Preoperative diagnosis:biliary colic Postoperative diagnosis: Same as above Procedure: Laparoscopic cholecystectomy Surgeon: Dr. Serita Grammes Anesthesia: General Specimens: Gallbladder and contents to pathology Complications: None Drains: None Sponge needle count was correct at completion Disposition to recovery stable condition  Indications: This is a 59 yof who has gallstones and biliary colic symptoms. She continues to have symptoms associated with this that are hampering quality of life.  I have discussed with her PCP and her GI physician who both agree that cholecystectomy is reasonable.   Procedure: After informed consent was obtained the patient underwent general anesthesia.  Antibiotics had been given.  SCDs were in place.  She was then prepped and draped in the standard sterile surgical fashion.  Surgical timeout was then performed.  I infiltrated Marcaine below the umbilicus.  I made a vertical incision.  I grasped the fascia and incised this sharply. The peritoneum was entered bluntly.  I then placed a 0 Vicryl pursestring suture through the fascia.  I inserted a Hassan trocar and insufflated the abdomen to 15 mmHg pressure.  I then placed 3 further 5 mm trocars in the epigastrium and right upper quadrant under direct vision without complication.  She was noted to have chronic cholecystitis.  Her gallbladder was retracted cephalad and lateral.  I was able to dissect the triangle and get the critical view of safety.  I then clipped the cystic duct leaving 2 clips in place.  These clips completely traversed the duct and the duct was viable.  I treated the cystic artery in a similar fashion.  There was a smaller branch of the cystic artery that I also clipped and divided.  Cautery was then used to remove the gallbladder from the liver bed.  It was then placed in a retrieval bag and removed the umbilicus.  Hemostasis was observed.  There was no spillage of bile.  I then placed and I then  removed the [facility] trocar and tied down my pursestring. I did place an 2 additional 0 Vicryl sutures using the suture passer device at the umbilicus.  The remaining trocars were removed and the abdomen was desufflated.  These were all closed with 4-0 Monocryl, glue, and Steri-Strips.  She tolerated this well was extubated and transferred to recovery stable.

## 2019-08-23 NOTE — Transfer of Care (Signed)
Immediate Anesthesia Transfer of Care Note  Patient: Kimberly Mcgee  Procedure(s) Performed: LAPAROSCOPIC CHOLECYSTECTOMY (N/A Abdomen)  Patient Location: PACU  Anesthesia Type:General  Level of Consciousness: awake, alert  and oriented  Airway & Oxygen Therapy: Patient Spontanous Breathing  Post-op Assessment: Report given to RN and Post -op Vital signs reviewed and stable  Post vital signs: Reviewed and stable  Last Vitals:  Vitals Value Taken Time  BP 140/67 08/23/19 1218  Temp 36.4 C 08/23/19 1215  Pulse 80 08/23/19 1221  Resp 32 08/23/19 1221  SpO2 97 % 08/23/19 1221  Vitals shown include unvalidated device data.  Last Pain:  Vitals:   08/23/19 0934  TempSrc:   PainSc: 0-No pain         Complications: No apparent anesthesia complications

## 2019-08-23 NOTE — H&P (Signed)
58 yof referred by Dr Zenovia Jarred for gallstones. She is a very healthy, active 85 yo who lives independently. she has prior hemorrhoid surgery. she has several episodes of ruq pain over last few months. none recently since she has altered her diet. she did have some nausea, no emesis. they occurred after fatty meal and resolved on own. she did go to er once for this pain. there are two major attacks but likely some other minor symptoms she has noted. she has chronic constipation and IBS symptoms she states but his is very different than usual abdominal issues she has. she has an [place] from 5/14 that shows multiple small gallstones without gbw thickening or fluid. her cbd is 4 mm. she has a ct from last week that was done right after visit that is fairly unremarkable. her lfts are normal. she is here with her daughter Kathrynn Running to discuss options.   Past Surgical History  Hemorrhoidectomy  Hysterectomy (not due to cancer) - Complete   Diagnostic Studies History  Colonoscopy  within last year Mammogram  1-3 years ago Pap Smear  >5 years ago  Allergies  No Known Allergies    Medication History  Amitiza (24MCG Capsule, Oral) Active. Citalopram Hydrobromide (20MG  Tablet, Oral) Active. Clotrimazole-Betamethasone (1-0.05% Cream, External) Active. Losartan Potassium-HCTZ (100-25MG  Tablet, Oral) Active. Omeprazole (40MG  Capsule DR, Oral) Active. Polyethylene Glycol 3350 (17GM/SCOOP Powder, Oral) Active. Medications Reconciled  Social History  Alcohol use  Remotely quit alcohol use. Caffeine use  Coffee. No drug use  Tobacco use  Never smoker.  Family History  Arthritis  Brother, Mother. Cervical Cancer  Mother. Hypertension  Father. Melanoma  Son.  Pregnancy / Birth History  Age of menopause  27-55 Gravida  3 Irregular periods  Maternal age  28-25 Para  3  Other Problems Bladder Problems  Cholelithiasis  Diverticulosis  Hemorrhoids  High  blood pressure   Review of Systems General Not Present- Appetite Loss, Chills, Fatigue, Fever, Night Sweats, Weight Gain and Weight Loss. Skin Not Present- Change in Wart/Mole, Dryness, Hives, Jaundice, New Lesions, Non-Healing Wounds, Rash and Ulcer. Respiratory Not Present- Bloody sputum, Chronic Cough, Difficulty Breathing, Snoring and Wheezing. Cardiovascular Not Present- Chest Pain, Difficulty Breathing Lying Down, Leg Cramps, Palpitations, Rapid Heart Rate, Shortness of Breath and Swelling of Extremities. Gastrointestinal Present- Abdominal Pain, Bloating and Constipation. Not Present- Bloody Stool, Change in Bowel Habits, Chronic diarrhea, Difficulty Swallowing, Excessive gas, Gets full quickly at meals, Hemorrhoids, Indigestion, Nausea, Rectal Pain and Vomiting. Female Genitourinary Not Present- Frequency, Nocturia, Painful Urination, Pelvic Pain and Urgency. Neurological Not Present- Decreased Memory, Fainting, Headaches, Numbness, Seizures, Tingling, Tremor, Trouble walking and Weakness. Psychiatric Not Present- Anxiety, Bipolar, Change in Sleep Pattern, Depression, Fearful and Frequent crying. Hematology Not Present- Blood Thinners, Easy Bruising, Excessive bleeding, Gland problems, HIV and Persistent Infections.   Physical Exam  General Mental Status-Alert. Orientation-Oriented X3. Head and Neck Trachea-midline. Eye Sclera/Conjunctiva - Bilateral-No scleral icterus. Chest and Lung Exam Chest and lung exam reveals -quiet, even and easy respiratory effort with no use of accessory muscles. Cardiovascular Cardiovascular examination reveals -normal heart sounds, regular rate and rhythm with no murmurs. Abdomen Note: soft nt/nd bs present Neurologic Neurologic evaluation reveals -alert and oriented x 3 with no impairment of recent or remote memory.   Assessment & Plan   GALLSTONES (K80.20) Story: Laparoscopic cholecystectomy we had long discussion about her  age, surgery and her symptoms. she clearly has symptomatic cholelithiasis. we discussed observation vs surgery. observation would avoid surgery but  she would likely have continued symptoms. more importantly this places her at higher risk for complicated disease such as cholecystitis and gsp. surgery is not without risk. i think biggest risk of surgery is the cognitive effect at someone her age. she is otherwise healthy so I think would tolerated surgery. after a long conversation I did state that I think her risk of surgery is less than risk of continued gb attacks and more complicated disease which would likely impact her life considerably more. I would expect her to have longer recovery than normal, would keep overnight and have pt see her following surgery for recommendations. I also discussed diarrhea post cholecystectomy, open procedure, bile leak and other risks of surgery. we have all decided that proceeding with surgery likely best option.

## 2019-08-23 NOTE — Plan of Care (Signed)
  Problem: Education: Goal: Knowledge of General Education information will improve Description: Including pain rating scale, medication(s)/side effects and non-pharmacologic comfort measures Outcome: Progressing   Problem: Health Behavior/Discharge Planning: Goal: Ability to manage health-related needs will improve Outcome: Progressing   Problem: Clinical Measurements: Goal: Ability to maintain clinical measurements within normal limits will improve Outcome: Progressing Goal: Respiratory complications will improve Outcome: Progressing Goal: Cardiovascular complication will be avoided Outcome: Progressing   Problem: Nutrition: Goal: Adequate nutrition will be maintained Outcome: Progressing   Problem: Pain Managment: Goal: General experience of comfort will improve Outcome: Progressing   Problem: Safety: Goal: Ability to remain free from injury will improve Outcome: Progressing   Problem: Skin Integrity: Goal: Risk for impaired skin integrity will decrease Outcome: Progressing   

## 2019-08-23 NOTE — Anesthesia Procedure Notes (Signed)
Procedure Name: Intubation Date/Time: 08/23/2019 11:07 AM Performed by: Marsa Aris, CRNA Pre-anesthesia Checklist: Patient identified, Emergency Drugs available, Suction available and Patient being monitored Patient Re-evaluated:Patient Re-evaluated prior to induction Oxygen Delivery Method: Circle System Utilized Preoxygenation: Pre-oxygenation with 100% oxygen Induction Type: IV induction Ventilation: Mask ventilation without difficulty Laryngoscope Size: Miller and 2 Grade View: Grade II Tube type: Oral Tube size: 7.0 mm Number of attempts: 1 Airway Equipment and Method: Stylet Placement Confirmation: ETT inserted through vocal cords under direct vision,  positive ETCO2 and breath sounds checked- equal and bilateral Secured at: 22 cm Tube secured with: Tape Dental Injury: Teeth and Oropharynx as per pre-operative assessment

## 2019-08-23 NOTE — Progress Notes (Signed)
Pt arrived to room 6N27 after surgery. Received report from Raymond, [place] in PACU. See assessment. Will continue to monitor.

## 2019-08-23 NOTE — Discharge Instructions (Signed)
[facility] LAPAROSCOPIC SURGERY: POST OP INSTRUCTIONS  Always review your discharge instruction sheet given to you by the facility where your surgery was performed. IF YOU HAVE DISABILITY OR FAMILY LEAVE FORMS, YOU MUST BRING THEM TO THE OFFICE FOR PROCESSING.   DO NOT GIVE THEM TO YOUR DOCTOR.  1. A prescription for pain medication may be given to you upon discharge.  Take your pain medication as prescribed, if needed.  If narcotic pain medicine is not needed, then you may take acetaminophen (Tylenol), naprosyn (Alleve), or ibuprofen (Advil) as needed. 2. Take your usually prescribed medications unless otherwise directed. 3. If you need a refill on your pain medication, please contact your pharmacy.  They will contact our office to request authorization. Prescriptions will not be filled after 5pm or on week-ends. 4. You should follow a light diet the first few days after arrival home, such as soup and crackers, etc.  Be sure to include lots of fluids daily. 5. Most patients will experience some swelling and bruising in the area of the incisions.  Ice packs will help.  Swelling and bruising can take several days to resolve.  6. It is common to experience some constipation if taking pain medication after surgery.  Increasing fluid intake and taking a stool softener (such as Colace) will usually help or prevent this problem from occurring.  A mild laxative (Milk of Magnesia or Miralax) should be taken according to package instructions if there are no bowel movements after 48 hours. 7. Unless discharge instructions indicate otherwise, you may remove your bandages 48 hours after surgery, and you may shower at that time.  You may have steri-strips (small skin tapes) in place directly over the incision.  These strips should be left on the skin for 7-10 days.  If your surgeon used skin glue on the incision, you may shower in 24 hours.  The glue will flake  off over the next 2-3 weeks.  Any sutures or staples will be removed at the office during your follow-up visit. 8. ACTIVITIES:  You may resume regular (light) daily activities beginning the next day--such as daily self-care, walking, climbing stairs--gradually increasing activities as tolerated.  You may have sexual intercourse when it is comfortable.  Refrain from any heavy lifting or straining until approved by your doctor. a. You may drive when you are no longer taking prescription pain medication, you can comfortably wear a seatbelt, and you can safely maneuver your car and apply brakes. b. RETURN TO WORK:  __________________________________________________________ 9. You should see your doctor in the office for a follow-up appointment approximately 2-3 weeks after your surgery.  Make sure that you call for this appointment within a day or two after you arrive home to insure a convenient appointment time. 10. OTHER INSTRUCTIONS: __________________________________________________________________________________________________________________________ __________________________________________________________________________________________________________________________ WHEN TO CALL YOUR DOCTOR: 1. Fever over 101.0 2. Inability to urinate 3. Continued bleeding from incision. 4. Increased pain, redness, or drainage from the incision. 5. Increasing abdominal pain  The clinic staff is available to answer your questions during regular business hours.  Please don't hesitate to call and ask to speak to one of the nurses for clinical concerns.  If you have a medical emergency, go to the nearest emergency room or call 911.  A surgeon from [facility] is always on call at the hospital. [address], [address], [place], [place]  [address] ? [address] [address], [place], [place]   [address] [phone] ? [phone] ? [phone] Web site: [email or web address]  

## 2019-08-23 NOTE — Anesthesia Postprocedure Evaluation (Signed)
Anesthesia Post Note  Patient: Kimberly Mcgee  Procedure(s) Performed: LAPAROSCOPIC CHOLECYSTECTOMY (N/A Abdomen)     Patient location during evaluation: PACU Anesthesia Type: General Level of consciousness: awake and alert Pain management: pain level controlled Vital Signs Assessment: post-procedure vital signs reviewed and stable Respiratory status: spontaneous breathing, nonlabored ventilation, respiratory function stable and patient connected to nasal cannula oxygen Cardiovascular status: blood pressure returned to baseline and stable Postop Assessment: no apparent nausea or vomiting Anesthetic complications: no    Last Vitals:  Vitals:   08/23/19 1315 08/23/19 1329  BP: (!) 146/66 125/70  Pulse: 71 74  Resp: 16   Temp: 36.8 C 36.8 C  SpO2: 96% 98%    Last Pain:  Vitals:   08/23/19 1429  TempSrc:   PainSc: 8                  Loyola Santino COKER

## 2019-08-23 NOTE — Interval H&P Note (Signed)
History and Physical Interval Note:  08/23/2019 10:35 AM  Kimberly Mcgee  has presented today for surgery, with the diagnosis of GALLSTONES.  The various methods of treatment have been discussed with the patient and family. After consideration of risks, benefits and other options for treatment, the patient has consented to  Procedure(s) with comments: LAPAROSCOPIC CHOLECYSTECTOMY (N/A) - GENERAL WITH TAP BLOCK BILATERALLY as a surgical intervention.  The patient's history has been reviewed, patient examined, no change in status, stable for surgery.  I have reviewed the patient's chart and labs.  Questions were answered to the patient's satisfaction.     Rolm Bookbinder

## 2019-08-24 ENCOUNTER — Encounter (HOSPITAL_COMMUNITY): Payer: Self-pay | Admitting: General Surgery

## 2019-08-24 DIAGNOSIS — K8010 Calculus of gallbladder with chronic cholecystitis without obstruction: Secondary | ICD-10-CM | POA: Diagnosis not present

## 2019-08-24 DIAGNOSIS — Z9049 Acquired absence of other specified parts of digestive tract: Secondary | ICD-10-CM | POA: Diagnosis not present

## 2019-08-24 MED ORDER — OXYCODONE HCL 5 MG PO TABS: 5.0000 mg | ORAL_TABLET | ORAL | 0 refills | Status: DC | PRN

## 2019-08-24 NOTE — TOC Transition Note (Addendum)
Transition of Care [facility]) - CM/SW Discharge Note   Patient Details  Name: JACQUES NICOLAY MRN: [id number] Date of Birth: 1925-04-10  Transition of Care [facility]) CM/SW Contact:  Marilu Favre, RN Phone Number: 08/24/2019, 10:43 AM   Clinical Narrative:     Patient decided she would like rolling walker instead of a Rollator . Ordered rolling walker  Final next level of care: Home/Self Care Barriers to Discharge: No Barriers Identified   Patient Goals and CMS Choice Patient states their goals for this hospitalization and ongoing recovery are:: to go home CMS Medicare.gov Compare Post Acute Care list provided to:: Patient Choice offered to / list presented to : Patient, Adult Children  Discharge Placement                       Discharge Plan and Services   Discharge Planning Services: CM Consult            DME Arranged: 3-N-1, Walker rolling DME Agency: [employer] Date DME Agency Contacted: 08/24/19 Time DME Agency Contacted: 71 Representative spoke with at DME Agency: [place]: NA          Social Determinants of Health ([place]) Interventions     Readmission Risk Interventions No flowsheet data found.

## 2019-08-24 NOTE — Evaluation (Signed)
Physical Therapy Evaluation Patient Details Name: Kimberly Mcgee MRN: [id number] DOB: 09-24-1925 Today's Date: 08/24/2019   History of Present Illness  Pt is a 85 y.o. F with no significant PMH who presents with gallstones s/p laprascopic cholecystectomy  Clinical Impression  Patient evaluated by Physical Therapy with no further acute PT needs identified. Pt reporting good pain control, ambulating hallway distances with and without a walker at a supervision level. Prior to admission, pt lives alone, but pt daughter plans to provide necessary assist initially. Education provided re: activity recommendations, splinting, incentive spirometer use. All education has been completed and the patient has no further questions. See below for any follow-up Physical Therapy or equipment needs. PT is signing off. Thank you for this referral.     Follow Up Recommendations No PT follow up    Equipment Recommendations  Other (comment);3in1 (PT)(Rollator)    Recommendations for Other Services       Precautions / Restrictions Precautions Precautions: None Restrictions Weight Bearing Restrictions: No      Mobility  Bed Mobility Overal bed mobility: Independent                Transfers Overall transfer level: Independent Equipment used: None                Ambulation/Gait Ambulation/Gait assistance: Supervision;Min assist Gait Distance (Feet): 300 Feet Assistive device: Rolling walker (2 wheeled);None       General Gait Details: Pt initially requiring handheld assist for room ambulation, mildly unsteady. Progressing to supervision level with use of walker for hallway ambulation and then with no AD. Good speed, posture   Stairs            Wheelchair Mobility    Modified Rankin (Stroke Patients Only)       Balance Overall balance assessment: Mild deficits observed, not formally tested                                           Pertinent  Vitals/Pain Pain Assessment: Faces Faces Pain Scale: Hurts a little bit Pain Location: surgical site Pain Descriptors / Indicators: Guarding Pain Intervention(s): Monitored during session;Premedicated before session    Home Living Family/patient expects to be discharged to:: Private residence Living Arrangements: Alone Available Help at Discharge: Family;Available PRN/intermittently Type of Home: House Home Access: Stairs to enter   [employer] of Steps: 1 Home Layout: One level Home Equipment: None      Prior Function Level of Independence: Independent         Comments: Active, drives, community ambulatory     Hand Dominance        Extremity/Trunk Assessment   Upper Extremity Assessment Upper Extremity Assessment: Overall WFL for tasks assessed    Lower Extremity Assessment Lower Extremity Assessment: Overall WFL for tasks assessed       Communication   Communication: No difficulties  Cognition Arousal/Alertness: Awake/alert Behavior During Therapy: WFL for tasks assessed/performed Overall Cognitive Status: Within Functional Limits for tasks assessed                                        General Comments      Exercises     Assessment/Plan    PT Assessment Patent does not need any further PT services  PT Problem List  Decreased activity tolerance;Decreased balance;Decreased mobility;Pain       PT Treatment Interventions      PT Goals (Current goals can be found in the Care Plan section)  Acute Rehab PT Goals Patient Stated Goal: "go shopping." PT Goal Formulation: With patient Time For Goal Achievement: 09/07/19 Potential to Achieve Goals: Good    Frequency     Barriers to discharge        Co-evaluation               AM-PAC PT "6 Clicks" Mobility  Outcome Measure Help needed turning from your back to your side while in a flat bed without using bedrails?: None Help needed moving from lying on your back  to sitting on the side of a flat bed without using bedrails?: None Help needed moving to and from a bed to a chair (including a wheelchair)?: None Help needed standing up from a chair using your arms (e.g., wheelchair or bedside chair)?: None Help needed to walk in hospital room?: A Little Help needed climbing 3-5 steps with a railing? : A Little 6 Click Score: 22    End of Session   Activity Tolerance: Patient tolerated treatment well Patient left: in bed;with call bell/phone within reach;with family/visitor present Nurse Communication: Mobility status PT Visit Diagnosis: Unsteadiness on feet (R26.81)    Time: [id number] PT Time Calculation (min) (ACUTE ONLY): 17 min   Charges:   PT Evaluation $PT Eval Low Complexity: 1 Low          Ellamae Sia, PT, DPT Acute Rehabilitation Services Pager [phone] Office [phone]   Willy Eddy 08/24/2019, 10:37 AM

## 2019-08-24 NOTE — Plan of Care (Signed)
  Problem: Activity: Goal: Risk for activity intolerance will decrease Outcome: Progressing   Problem: Nutrition: Goal: Adequate nutrition will be maintained Outcome: Progressing   Problem: Pain Managment: Goal: General experience of comfort will improve Outcome: Progressing   

## 2019-08-24 NOTE — Progress Notes (Signed)
1 Day Post-Op   Subjective/Chief Complaint: Doing great, no issues   Objective: Vital signs in last 24 hours: Temp:  [97.6 F (36.4 C)-98.8 F (37.1 C)] 98.3 F (36.8 C) (08/26 0415) Pulse Rate:  [54-83] 54 (08/26 0415) Resp:  [16-34] 16 (08/26 0415) BP: (125-226)/(66-97) 129/67 (08/26 0415) SpO2:  [94 %-99 %] 97 % (08/26 0415) Weight:  [72.6 kg] 72.6 kg (08/25 0934) Last BM Date: 08/23/19  Intake/Output from previous day: 08/25 0701 - 08/26 0700 In: 1435 [P.O.:735; I.V.:700] Out: 1325 [Urine:1300; Blood:25] Intake/Output this shift: No intake/output data recorded.  GI: soft approp tender incisions clean  Lab Results:  No results for input(s): WBC, HGB, HCT, PLT in the last 72 hours. BMET No results for input(s): NA, K, CL, CO2, GLUCOSE, BUN, CREATININE, CALCIUM in the last 72 hours. PT/INR No results for input(s): LABPROT, INR in the last 72 hours. ABG No results for input(s): PHART, HCO3 in the last 72 hours.  Invalid input(s): PCO2, PO2  Studies/Results: No results found.  Anti-infectives: Anti-infectives (From admission, onward)   Start     Dose/Rate Route Frequency Ordered Stop   08/23/19 0930  ceFAZolin (ANCEF) IVPB 2g/100 mL premix     2 g 200 mL/hr over 30 Minutes Intravenous On call to O.R. 08/23/19 0917 08/23/19 1117      Assessment/Plan: POD 1 lap chole -home today after pt clears  Rolm Bookbinder 08/24/2019

## 2019-08-24 NOTE — Progress Notes (Signed)
Scot Dock to be D/C'd  per MD order. Discussed with the patient and all questions fully answered.  VSS, Skin clean, dry and intact without evidence of skin break down, no evidence of skin tears noted.  IV catheter discontinued intact. Site without signs and symptoms of complications. Dressing and pressure applied.  An After Visit Summary was printed and given to the patient. Patient received prescription.  D/c education completed with patient/family including follow up instructions, medication list, d/c activities limitations if indicated, with other d/c instructions as indicated by MD - patient able to verbalize understanding, all questions fully answered.   Patient instructed to return to ED, call 911, or call MD for any changes in condition.   Patient to be escorted via [place], and D/C home via private auto.

## 2019-08-25 NOTE — Discharge Summary (Signed)
Physician Discharge Summary  Patient ID: Kimberly Mcgee MRN: [id number] DOB/AGE: 07-06-1925 85 y.o.  Admit date: 08/23/2019 Discharge date: 08/25/2019  Admission Diagnoses: Biliary colic Hypertension  Discharge Diagnoses:  Active Problems:   S/P laparoscopic cholecystectomy   Discharged Condition: good  Hospital Course: 85 yof with symptomatic gallstones who continues to have difficulties. We discussed options and due to her overall health elected to proceed with surgery as this was getting to be lifestyle limiting.  She underwent uneventful laparoscopic cholecystectomy and remained overnight. She was doing well the following morning. Was cleared by PT and discharged to home  Consults: None  Significant Diagnostic Studies: none  Treatments: surgery: lap chole   Disposition:    Allergies as of 08/24/2019   No Known Allergies     Medication List    TAKE these medications   aspirin EC 81 MG tablet Take 81 mg by mouth every other day.   CALCIUM 600 + D PO Take 1 tablet by mouth 2 (two) times daily.   citalopram 20 MG tablet Commonly known as: CELEXA Take 20 mg by mouth daily after breakfast.   COSAMIN ASU ADVANCED FORMULA PO Take 1 tablet by mouth 2 (two) times daily.   FISH OIL PO Take 1 capsule by mouth 2 (two) times daily.   losartan-hydrochlorothiazide 100-25 MG tablet Commonly known as: HYZAAR Take 1 tablet by mouth daily after breakfast.   lubiprostone 8 MCG capsule Commonly known as: Amitiza Take 1 capsule (8 mcg total) by mouth 2 (two) times daily with a meal.   Melatonin 5 MG Tabs Take 5 mg by mouth at bedtime as needed (sleep).   naproxen sodium 220 MG tablet Commonly known as: ALEVE Take 440 mg by mouth 2 (two) times daily as needed (pain).   ONE-A-DAY WOMENS 50 PLUS PO Take 1 tablet by mouth daily.   oxyCODONE 5 MG immediate release tablet Commonly known as: Oxy IR/ROXICODONE Take 1 tablet (5 mg total) by mouth every 4 (four) hours as  needed for moderate pain.      Follow-up Information    Rolm Bookbinder, MD In 3 weeks.   Specialty: General Surgery Contact information: [address] [place] [address] [phone]           Signed: Rolm Bookbinder 08/25/2019, 9:53 AM

## 2019-08-26 ENCOUNTER — Telehealth: Payer: Self-pay | Admitting: Internal Medicine

## 2019-08-26 NOTE — Telephone Encounter (Signed)
Pts daughter states that her mother actually had a small BM about 64min ago. Suggested pt take 2 doses of miralax an hour apart. Pts daughter aware.

## 2019-08-28 ENCOUNTER — Other Ambulatory Visit: Payer: Self-pay | Admitting: Internal Medicine

## 2019-08-30 ENCOUNTER — Telehealth: Payer: Self-pay | Admitting: Internal Medicine

## 2019-08-30 NOTE — Telephone Encounter (Signed)
Pt has been adding miralax to her amitiza but she has not had a bm and is uncomfortable. Discussed with daughter that she can take mag citrate drink half the bottle and then drink the other half if no BM in an hour. Then pt should resume amitiza tomorrow morning. She verbalized understanding.

## 2019-09-06 ENCOUNTER — Telehealth: Payer: Self-pay | Admitting: Internal Medicine

## 2019-09-06 NOTE — Telephone Encounter (Signed)
Pt's daughter York Cerise reported that pt is still experiencing fecal incontinence and abdomimal pain with Amitiza.  She stated that her BMs are in clumps rather than solid stool.  Please advise.

## 2019-09-06 NOTE — Telephone Encounter (Signed)
Patient's daughter reports that her mom is having pain with BM and pressure in the am prior to the BM.  She has tried linzess and now both strengths of Amitiza as well as Miralax with minimal results.  Pain with BM is new.  She will come in and see Ellouise Newer, PA tomorrow at 10:30.  Her daughter lives in [place] and can't get here for the appt, she requests a call and be on speaker phone at the time of the appt.  Ellouise Newer, PA is agreeable to this.

## 2019-09-07 ENCOUNTER — Encounter: Payer: Self-pay | Admitting: Physician Assistant

## 2019-09-07 ENCOUNTER — Ambulatory Visit (INDEPENDENT_AMBULATORY_CARE_PROVIDER_SITE_OTHER): Payer: PPO | Admitting: Physician Assistant

## 2019-09-07 VITALS — BP 144/92 | HR 80 | Temp 98.1°F | Ht 59.75 in | Wt 162.1 lb

## 2019-09-07 DIAGNOSIS — K5900 Constipation, unspecified: Secondary | ICD-10-CM

## 2019-09-07 NOTE — Patient Instructions (Signed)
If you are age 83 or older, your body mass index should be between 23-30. Your Body mass index is 31.93 kg/m. If this is out of the aforementioned range listed, please consider follow up with your Primary Care Provider.  If you are age 63 or younger, your body mass index should be between 19-25. Your Body mass index is 31.93 kg/m. If this is out of the aformentioned range listed, please consider follow up with your Primary Care Provider.   Continue taking Amatiza 8 mcg twice a day. Increase your mirilax to twice a day.  Start taking Align daily for 1 month. Samples given  A high fiber diet with plenty of fluids (up to 8 glasses of water daily) is suggested to relieve these symptoms.    Thank you for choosing me and [facility]

## 2019-09-07 NOTE — Progress Notes (Signed)
Chief Complaint: Rectal pain  HPI:    Kimberly Mcgee is a 85 year old Caucasian female with a past medical history as listed below including colonic diverticulosis, rectal/anal stenosis and hypertension, known to Dr. Hilarie Fredrickson, who presents to clinic today with a complaint of rectal pain.    05/31/2019 patient seen in office by Dr. Hilarie Fredrickson.  At that visit episodic right upper quadrant pain in the setting of gallstones was discussed.  Patient was referred to surgery.  Also discussed lifelong constipation likely exacerbated by pelvic floor dysfunction and bladder prolapse.  She was started Linzess 145 mcg daily.    06/29/2019 flex sig with anal stricture, my manual dilation with DRE, rectum and exam and sigmoid colon were normal.    07/18/2019 CT showed mild circumferential wall thickening in the lower rectum which is likely due to rectal prolapse/pelvic floor dysfunction.  Also lower esophagus appeared slightly thickened and base some reflux related changes.  Moderate-sized hiatal hernia.  Cholecystectomy was also planned.    08/01/2019 patient reported uncontrollable diarrhea with Amitiza 24 mcg twice daily.  Reduce to 8 mcg twice daily with food.    08/23/2019 patient had a cholecystectomy.  She was then constipated afterwards.    09/06/2019 patient's daughter reported that her mother was having pain with bowel movements and pressure in the morning prior to the bowel movement.  She tried Linzess and now both strengths of Amitiza as well as MiraLAX with minimal results.  Pain with bowel movement is new.    Today, the patient presents to clinic by herself but her daughter Kimberly Mcgee has requested to be called and is on speaker phone during the visit and assists with history.  Her daughter explains her history.  Initially starting on Linzess which caused blowouts then Amitiza at a high dose which caused blowouts.  Currently, patient is on Amitiza 8 mcg twice daily and has been experiencing some fecal urgency with a  "fullness in the rectum".  Describes that sometimes she will have some liquid fecal incontinence.  Most recently since Sunday, over the past 3 days, she has been using 1 dose of MiraLAX with her twice daily Amitiza and things are better but not completely resolved.  Describes that she can still have some liquid stool which she feels is around a "lump in there".  Also describes having to run to the bathroom at times.  Also does not feel completely empty after every bowel movement.    Describes recent cholecystectomy, apparently was constipated after this but used mag citrate and felt better.    Does admit to not drinking very much water.    Denies fever, chills, blood in her stool or rectal pain.   Past Medical History:  Diagnosis Date   Arthritis    right knee   Bladder prolapse, female, acquired    Gallstones    Hypertension     Past Surgical History:  Procedure Laterality Date   ABDOMINAL HYSTERECTOMY     CHOLECYSTECTOMY N/A 08/23/2019   Procedure: LAPAROSCOPIC CHOLECYSTECTOMY;  Surgeon: Rolm Bookbinder, MD;  Location: [facility];  Service: General;  Laterality: N/A;  GENERAL WITH TAP BLOCK BILATERALLY   HEMORROIDECTOMY     x 2   KNEE SURGERY      Current Outpatient Medications  Medication Sig Dispense Refill   aspirin EC 81 MG tablet Take 81 mg by mouth every other day.     Calcium Carbonate-Vitamin D (CALCIUM 600 + D PO) Take 1 tablet by mouth 2 (two) times  daily.       citalopram (CELEXA) 20 MG tablet Take 20 mg by mouth daily after breakfast.      losartan-hydrochlorothiazide (HYZAAR) 100-25 MG per tablet Take 1 tablet by mouth daily after breakfast.      lubiprostone (AMITIZA) 8 MCG capsule Take 1 capsule (8 mcg total) by mouth 2 (two) times daily with a meal. 60 capsule 3   Melatonin 5 MG TABS Take 5 mg by mouth at bedtime as needed (sleep).     Misc Natural Products (COSAMIN ASU ADVANCED FORMULA PO) Take 1 tablet by mouth 2 (two) times daily.     Multiple  Vitamins-Minerals (ONE-A-DAY WOMENS 50 PLUS PO) Take 1 tablet by mouth daily.       naproxen sodium (ALEVE) 220 MG tablet Take 440 mg by mouth 2 (two) times daily as needed (pain).     Omega-3 Fatty Acids (FISH OIL PO) Take 1 capsule by mouth 2 (two) times daily.      oxyCODONE (OXY IR/ROXICODONE) 5 MG immediate release tablet Take 1 tablet (5 mg total) by mouth every 4 (four) hours as needed for moderate pain. 8 tablet 0   No current facility-administered medications for this visit.     Allergies as of 09/07/2019   (No Known Allergies)    Family History  Problem Relation Age of Onset   Uterine cancer Mother    Liver disease Neg Hx    Colon cancer Neg Hx    Pancreatic cancer Neg Hx    Stomach cancer Neg Hx    Rectal cancer Neg Hx    Esophageal cancer Neg Hx     Social History   Socioeconomic History   Marital status: Widowed    Spouse name: Not on file   Number of children: Not on file   Years of education: Not on file   Highest education level: Not on file  Occupational History   Not on file  Social Needs   Financial resource strain: Not on file   Food insecurity    Worry: Not on file    Inability: Not on file   Transportation needs    Medical: Not on file    Non-medical: Not on file  Tobacco Use   Smoking status: Never Smoker   Smokeless tobacco: Never Used  Substance and Sexual Activity   Alcohol use: No   Drug use: No   Sexual activity: Never  Lifestyle   Physical activity    Days per week: Not on file    Minutes per session: Not on file   Stress: Not on file  Relationships   Social connections    Talks on phone: Not on file    Gets together: Not on file    Attends religious service: Not on file    Active member of club or organization: Not on file    Attends meetings of clubs or organizations: Not on file    Relationship status: Not on file   Intimate partner violence    Fear of current or ex partner: Not on file     Emotionally abused: Not on file    Physically abused: Not on file    Forced sexual activity: Not on file  Other Topics Concern   Not on file  Social History Narrative   Not on file    Review of Systems:    Constitutional: No weight loss, fever or chills Cardiovascular: No chest pain Respiratory: No SOB  Gastrointestinal: See HPI and otherwise negative  Physical Exam:  Vital signs: BP (!) 144/92 (BP Location: Left Arm, Patient Position: Sitting, Cuff Size: Normal)    Pulse 80    Temp 98.1 F (36.7 C)    Ht 4' 11.75" (1.518 m) Comment: height measured without shoes   Wt 162 lb 2 oz (73.5 kg)    BMI 31.93 kg/m   Constitutional:  Very Pleasant Elderly Caucasian female appears to be in NAD, Well developed, Well nourished, alert and cooperative Respiratory: Respirations even and unlabored. Lungs clear to auscultation bilaterally.   No wheezes, crackles, or rhonchi.  Cardiovascular: Normal S1, S2. No MRG. Regular rate and rhythm. No peripheral edema, cyanosis or pallor.  Gastrointestinal:  Soft, nondistended, nontender. No rebound or guarding. Normal bowel sounds. No appreciable masses or hepatomegaly. Rectal:  External: one external hemorrhoid; Internal: stricture felt, increased sphincter tone, no fissure Psychiatric: Demonstrates good judgement and reason without abnormal affect or behaviors.  MOST RECENT LABS AND IMAGING: CBC    Component Value Date/Time   WBC 8.6 08/17/2019 1001   RBC 4.23 08/17/2019 1001   HGB 12.7 08/17/2019 1001   HCT 39.4 08/17/2019 1001   PLT 267 08/17/2019 1001   MCV 93.1 08/17/2019 1001   MCH 30.0 08/17/2019 1001   MCHC 32.2 08/17/2019 1001   RDW 14.6 08/17/2019 1001   LYMPHSABS 2.7 07/12/2019 1426   MONOABS 0.7 07/12/2019 1426   EOSABS 0.2 07/12/2019 1426   BASOSABS 0.1 07/12/2019 1426    CMP     Component Value Date/Time   NA 136 08/17/2019 1001   K 3.7 08/17/2019 1001   CL 104 08/17/2019 1001   CO2 23 08/17/2019 1001   GLUCOSE 163 (H)  08/17/2019 1001   BUN 17 08/17/2019 1001   CREATININE 0.83 08/17/2019 1001   CALCIUM 9.5 08/17/2019 1001   PROT 6.7 08/17/2019 1001   ALBUMIN 3.6 08/17/2019 1001   AST 20 08/17/2019 1001   ALT 19 08/17/2019 1001   ALKPHOS 31 (L) 08/17/2019 1001   BILITOT 0.7 08/17/2019 1001   GFRNONAA >60 08/17/2019 1001   GFRAA >60 08/17/2019 1001    Assessment: 1.  Constipation: Chronic for the patient, thought to be related to anal stricture, past scarring from hemorrhoidectomy as well as pelvic floor dysfunction and bladder prolapse adding to the problem  Plan: 1.  Continue Amitiza 8 mcg twice daily with food. 2.  Start MiraLAX 1 dose twice daily with Amitiza. 3.  Recommend high-fiber diet and increased water intake of at least 6-8 eight ounce glasses of water per day. 4.  Patient asked about a probiotic.  Recommend Align once daily over the next month. 5.  Discussed with patient and her daughter that the patient needs to remain on a single regimen for at least a couple of weeks to see what is going to happen.  We can adjust as needed in the future.  I feel as though she is still constipated and experiencing overflow constipation.  She describes ball-like stools with some liquid. 6.  Also discussed that if the patient is having 4-5/7 good days a week, then this is a win.  We may not get her to feeling completely normal after every BM with her anatomical changes. 7.  Patient to follow in clinic with me in 3 to 4 weeks or sooner if necessary.  Ellouise Newer, PA-C [facility] 09/07/2019, 10:26 AM  Cc: Shon Baton, MD

## 2019-09-27 ENCOUNTER — Ambulatory Visit (INDEPENDENT_AMBULATORY_CARE_PROVIDER_SITE_OTHER): Payer: PPO | Admitting: Physician Assistant

## 2019-09-27 ENCOUNTER — Encounter: Payer: Self-pay | Admitting: Physician Assistant

## 2019-09-27 VITALS — BP 130/86 | HR 75 | Temp 98.6°F | Ht 61.0 in | Wt 163.0 lb

## 2019-09-27 DIAGNOSIS — K5900 Constipation, unspecified: Secondary | ICD-10-CM | POA: Diagnosis not present

## 2019-09-27 NOTE — Progress Notes (Signed)
Chief Complaint: Constipation  HPI:    Kimberly Mcgee is a 85 year old Caucasian female with a past medical history as listed below, known to Dr. Hilarie Fredrickson, who returns to clinic today for follow-up of her constipation.    09/07/2019 patient seen in clinic with her daughter on the phone.  They discussed a feeling of "fullness in the rectum" and fecal urgency with some liquid fecal incontinence.  At that time started Amitiza 8 mcg twice a day and MiraLAX twice a day.    Today, the patient presents to clinic and tells me that she is taking Amitiza 49mcg twice a day and MiraLAX once a day and is 99% better.  She is very happy with the results and in general is having good soft solid bowel movements and feels emptied.  She tells me that she even feels confident enough that she could "go somewhere if I wanted to".  Patient is very grateful for our help.    Denies fever, chills, weight loss or blood in her stool.  Past Medical History:  Diagnosis Date  . Arthritis    right knee  . Bladder prolapse, female, acquired   . Gallstones   . Hypertension     Past Surgical History:  Procedure Laterality Date  . ABDOMINAL HYSTERECTOMY    . CHOLECYSTECTOMY N/A 08/23/2019   Procedure: LAPAROSCOPIC CHOLECYSTECTOMY;  Surgeon: Rolm Bookbinder, MD;  Location: [facility];  Service: General;  Laterality: N/A;  GENERAL WITH TAP BLOCK BILATERALLY  . HEMORROIDECTOMY     x 2  . KNEE SURGERY      Current Outpatient Medications  Medication Sig Dispense Refill  . aspirin EC 81 MG tablet Take 81 mg by mouth every other day.    . Calcium Carbonate-Vitamin D (CALCIUM 600 + D PO) Take 1 tablet by mouth 2 (two) times daily.      . citalopram (CELEXA) 20 MG tablet Take 20 mg by mouth daily after breakfast.     . losartan-hydrochlorothiazide (HYZAAR) 100-25 MG per tablet Take 1 tablet by mouth daily after breakfast.     . lubiprostone (AMITIZA) 8 MCG capsule Take 1 capsule (8 mcg total) by mouth 2 (two) times daily with a meal.  60 capsule 3  . Melatonin 5 MG TABS Take 5 mg by mouth at bedtime as needed (sleep).    . Misc Natural Products (COSAMIN ASU ADVANCED FORMULA PO) Take 1 tablet by mouth 2 (two) times daily.    . Multiple Vitamins-Minerals (ONE-A-DAY WOMENS 50 PLUS PO) Take 1 tablet by mouth daily.      . naproxen sodium (ALEVE) 220 MG tablet Take 440 mg by mouth 2 (two) times daily as needed (pain).    . Omega-3 Fatty Acids (FISH OIL PO) Take 1 capsule by mouth 2 (two) times daily.     . polyethylene glycol powder (GLYCOLAX/MIRALAX) 17 GM/SCOOP powder Take 17 g by mouth daily as needed.     No current facility-administered medications for this visit.     Allergies as of 09/27/2019  . (No Known Allergies)    Family History  Problem Relation Age of Onset  . Uterine cancer Mother   . Liver disease Neg Hx   . Colon cancer Neg Hx   . Pancreatic cancer Neg Hx   . Stomach cancer Neg Hx   . Rectal cancer Neg Hx   . Esophageal cancer Neg Hx     Social History   Socioeconomic History  . Marital status: Widowed  Spouse name: Not on file  . Number of children: Not on file  . Years of education: Not on file  . Highest education level: Not on file  Occupational History  . Not on file  Social Needs  . Financial resource strain: Not on file  . Food insecurity    Worry: Not on file    Inability: Not on file  . Transportation needs    Medical: Not on file    Non-medical: Not on file  Tobacco Use  . Smoking status: Never Smoker  . Smokeless tobacco: Never Used  Substance and Sexual Activity  . Alcohol use: No  . Drug use: No  . Sexual activity: Never  Lifestyle  . Physical activity    Days per week: Not on file    Minutes per session: Not on file  . Stress: Not on file  Relationships  . Social Herbalist on phone: Not on file    Gets together: Not on file    Attends religious service: Not on file    Active member of club or organization: Not on file    Attends meetings of clubs  or organizations: Not on file    Relationship status: Not on file  . Intimate partner violence    Fear of current or ex partner: Not on file    Emotionally abused: Not on file    Physically abused: Not on file    Forced sexual activity: Not on file  Other Topics Concern  . Not on file  Social History Narrative  . Not on file    Review of Systems:    Constitutional: No weight loss, fever or chills Cardiovascular: No chest pain   Respiratory: No SOB Gastrointestinal: See HPI and otherwise negative   Physical Exam:  Vital signs: BP 130/86   Pulse 75   Temp 98.6 F (37 C)   Ht 5\' 1"  (1.549 m)   Wt 163 lb (73.9 kg)   BMI 30.80 kg/m   Constitutional:   Pleasant Caucasian female appears to be in NAD, Well developed, Well nourished, alert and cooperative Respiratory: Respirations even and unlabored. Lungs clear to auscultation bilaterally.   No wheezes, crackles, or rhonchi.  Cardiovascular: Normal S1, S2. No MRG. Regular rate and rhythm. No peripheral edema, cyanosis or pallor.  Gastrointestinal:  Soft, nondistended, nontender. No rebound or guarding. Normal bowel sounds. No appreciable masses or hepatomegaly. Psychiatric: Demonstrates good judgement and reason without abnormal affect or behaviors.  No new labs.  Assessment: 1.  Constipation: Relieved with Amitiza 8 mcg twice daily and MiraLAX once daily  Plan: 1.  Continue Amitiza 8 mcg twice daily and MiraLAX once daily. 2.  Patient will follow in clinic with Dr. Hilarie Fredrickson and myself as needed in the future.  Ellouise Newer, PA-C [facility] 09/27/2019, 11:13 AM  Cc: Shon Baton, MD

## 2019-09-27 NOTE — Patient Instructions (Signed)
Continune Amitiza 7mcg BID and Miralax qd. Call us if you have any further problems. So glad you are feeling better!

## 2019-09-28 NOTE — Progress Notes (Signed)
Addendum: Reviewed and agree with assessment and management plan. Zykera Abella M, MD  

## 2019-09-29 NOTE — Progress Notes (Signed)
Addendum: Reviewed and agree with assessment and management plan. Denim Kalmbach M, MD  

## 2019-10-07 ENCOUNTER — Other Ambulatory Visit: Payer: Self-pay | Admitting: *Deleted

## 2019-10-07 MED ORDER — LUBIPROSTONE 8 MCG PO CAPS: 8.0000 ug | ORAL_CAPSULE | Freq: Two times a day (BID) | ORAL | 1 refills | Status: DC

## 2019-10-31 DIAGNOSIS — L853 Xerosis cutis: Secondary | ICD-10-CM | POA: Diagnosis not present

## 2019-10-31 DIAGNOSIS — Z85828 Personal history of other malignant neoplasm of skin: Secondary | ICD-10-CM | POA: Diagnosis not present

## 2019-10-31 DIAGNOSIS — L820 Inflamed seborrheic keratosis: Secondary | ICD-10-CM | POA: Diagnosis not present

## 2019-10-31 DIAGNOSIS — L570 Actinic keratosis: Secondary | ICD-10-CM | POA: Diagnosis not present

## 2019-10-31 DIAGNOSIS — D225 Melanocytic nevi of trunk: Secondary | ICD-10-CM | POA: Diagnosis not present

## 2019-10-31 DIAGNOSIS — L814 Other melanin hyperpigmentation: Secondary | ICD-10-CM | POA: Diagnosis not present

## 2019-10-31 DIAGNOSIS — L821 Other seborrheic keratosis: Secondary | ICD-10-CM | POA: Diagnosis not present

## 2019-10-31 DIAGNOSIS — D1801 Hemangioma of skin and subcutaneous tissue: Secondary | ICD-10-CM | POA: Diagnosis not present

## 2019-11-07 ENCOUNTER — Telehealth: Payer: Self-pay | Admitting: Physician Assistant

## 2019-11-07 NOTE — Telephone Encounter (Signed)
Pt requested a 90-day supply prescription of Amitiza sent to her pharmacy.  She stated that current prescription is for 30 days.

## 2019-11-07 NOTE — Telephone Encounter (Signed)
Left message advising patient that we did send 3 month supply of medication to her pharmacy with 1 refill on 10/07/19. She may need to contact her pharmacy if she is only getting a 30 day supply. It is possible that her insurance will only allow her a 30 day supply at a time.

## 2019-11-29 ENCOUNTER — Other Ambulatory Visit: Payer: Self-pay | Admitting: Internal Medicine

## 2020-01-03 ENCOUNTER — Telehealth: Payer: Self-pay | Admitting: Physician Assistant

## 2020-01-03 NOTE — Telephone Encounter (Signed)
Called patient and gave Dr. Vena Rua recommendations. She said she will do the Miralax purge, but wait till tomorrow morning so she isn't up all night

## 2020-01-03 NOTE — Telephone Encounter (Signed)
2 options for acute symptoms Fleets enema x 2 OR 2. MiraLax purge, 8 doses of MiraLax in 32 oz of Gatorade and drink over 2-3 hours to purge bowel.  This could be repeat if needed for results x 1.  After either of the above, if good result, then resume Amitiza and MiraLax daily regimen

## 2020-01-03 NOTE — Telephone Encounter (Signed)
Pt would like a call to discuss constipation.

## 2020-01-03 NOTE — Telephone Encounter (Signed)
Patient called and says she is having constipation since Sun. She is taking Amitiza BID and Miralax 1-2 times a day. She has had a little mushy stool with hard pieces and feels like she can not empty her bowels. Ellouise Newer PA is out of the office today. Please advise.

## 2020-01-04 NOTE — Telephone Encounter (Signed)
Would recommend she do Fleet's enema x2 then. Or we can give her a sample of a bowel prep.  Thanks-JLL

## 2020-01-04 NOTE — Telephone Encounter (Signed)
Called patient back, and she says she finished the Miralax purge at 9:00 am and has only had a very little bit of mushy stool. She does not want to do a 2nd Miralax purge as suggested. She states her abdomin is firm and tender. No blood in stool. Please advise

## 2020-01-04 NOTE — Telephone Encounter (Signed)
Pt stated that she has followed Dr. Vena Rua recommendations and her constipation has not resolved.  She would like to discuss further.

## 2020-01-04 NOTE — Telephone Encounter (Signed)
Called patient and gave Kimberly Mcgee's recommendation. She said she will probably just go ahead and do the 2nd Miralax purge

## 2020-01-06 ENCOUNTER — Telehealth: Payer: Self-pay | Admitting: Physician Assistant

## 2020-01-06 NOTE — Telephone Encounter (Signed)
Called patient and she states even with 2 separate Miralax purges this week, she is still constipated. She says she can not do the enema by herself. I suggested Mag Citrate OTC. Take 1/2 bottle and if no results, then take the other 1/2

## 2020-01-31 DIAGNOSIS — L570 Actinic keratosis: Secondary | ICD-10-CM | POA: Diagnosis not present

## 2020-02-10 DIAGNOSIS — E1122 Type 2 diabetes mellitus with diabetic chronic kidney disease: Secondary | ICD-10-CM | POA: Diagnosis not present

## 2020-02-10 DIAGNOSIS — E781 Pure hyperglyceridemia: Secondary | ICD-10-CM | POA: Diagnosis not present

## 2020-02-10 DIAGNOSIS — M859 Disorder of bone density and structure, unspecified: Secondary | ICD-10-CM | POA: Diagnosis not present

## 2020-02-14 DIAGNOSIS — I10 Essential (primary) hypertension: Secondary | ICD-10-CM | POA: Diagnosis not present

## 2020-02-14 DIAGNOSIS — R82998 Other abnormal findings in urine: Secondary | ICD-10-CM | POA: Diagnosis not present

## 2020-02-17 DIAGNOSIS — K8020 Calculus of gallbladder without cholecystitis without obstruction: Secondary | ICD-10-CM | POA: Diagnosis not present

## 2020-02-17 DIAGNOSIS — E781 Pure hyperglyceridemia: Secondary | ICD-10-CM | POA: Diagnosis not present

## 2020-02-17 DIAGNOSIS — H9190 Unspecified hearing loss, unspecified ear: Secondary | ICD-10-CM | POA: Diagnosis not present

## 2020-02-17 DIAGNOSIS — N329 Bladder disorder, unspecified: Secondary | ICD-10-CM | POA: Diagnosis not present

## 2020-02-17 DIAGNOSIS — E871 Hypo-osmolality and hyponatremia: Secondary | ICD-10-CM | POA: Diagnosis not present

## 2020-02-17 DIAGNOSIS — E1122 Type 2 diabetes mellitus with diabetic chronic kidney disease: Secondary | ICD-10-CM | POA: Diagnosis not present

## 2020-02-17 DIAGNOSIS — M25561 Pain in right knee: Secondary | ICD-10-CM | POA: Diagnosis not present

## 2020-02-17 DIAGNOSIS — I700 Atherosclerosis of aorta: Secondary | ICD-10-CM | POA: Diagnosis not present

## 2020-02-17 DIAGNOSIS — N1831 Chronic kidney disease, stage 3a: Secondary | ICD-10-CM | POA: Diagnosis not present

## 2020-02-17 DIAGNOSIS — I129 Hypertensive chronic kidney disease with stage 1 through stage 4 chronic kidney disease, or unspecified chronic kidney disease: Secondary | ICD-10-CM | POA: Diagnosis not present

## 2020-02-17 DIAGNOSIS — Z0000 Encounter for general adult medical examination without abnormal findings: Secondary | ICD-10-CM | POA: Diagnosis not present

## 2020-02-17 DIAGNOSIS — Z1331 Encounter for screening for depression: Secondary | ICD-10-CM | POA: Diagnosis not present

## 2020-02-26 ENCOUNTER — Other Ambulatory Visit: Payer: Self-pay | Admitting: Physician Assistant

## 2020-03-13 DIAGNOSIS — D229 Melanocytic nevi, unspecified: Secondary | ICD-10-CM | POA: Diagnosis not present

## 2020-03-13 DIAGNOSIS — L821 Other seborrheic keratosis: Secondary | ICD-10-CM | POA: Diagnosis not present

## 2020-03-13 DIAGNOSIS — L578 Other skin changes due to chronic exposure to nonionizing radiation: Secondary | ICD-10-CM | POA: Diagnosis not present

## 2020-03-13 DIAGNOSIS — L814 Other melanin hyperpigmentation: Secondary | ICD-10-CM | POA: Diagnosis not present

## 2020-08-23 DIAGNOSIS — M25561 Pain in right knee: Secondary | ICD-10-CM | POA: Diagnosis not present

## 2020-09-03 ENCOUNTER — Other Ambulatory Visit: Payer: Self-pay | Admitting: Physician Assistant

## 2020-09-11 DIAGNOSIS — H6121 Impacted cerumen, right ear: Secondary | ICD-10-CM | POA: Diagnosis not present

## 2020-09-11 DIAGNOSIS — H938X3 Other specified disorders of ear, bilateral: Secondary | ICD-10-CM | POA: Diagnosis not present

## 2020-09-11 DIAGNOSIS — L299 Pruritus, unspecified: Secondary | ICD-10-CM | POA: Diagnosis not present

## 2020-09-13 DIAGNOSIS — L304 Erythema intertrigo: Secondary | ICD-10-CM | POA: Diagnosis not present

## 2020-09-13 DIAGNOSIS — L728 Other follicular cysts of the skin and subcutaneous tissue: Secondary | ICD-10-CM | POA: Diagnosis not present

## 2020-09-13 DIAGNOSIS — L218 Other seborrheic dermatitis: Secondary | ICD-10-CM | POA: Diagnosis not present

## 2020-09-13 DIAGNOSIS — L570 Actinic keratosis: Secondary | ICD-10-CM | POA: Diagnosis not present

## 2020-09-13 DIAGNOSIS — L821 Other seborrheic keratosis: Secondary | ICD-10-CM | POA: Diagnosis not present

## 2020-09-18 DIAGNOSIS — E1122 Type 2 diabetes mellitus with diabetic chronic kidney disease: Secondary | ICD-10-CM | POA: Diagnosis not present

## 2020-09-18 DIAGNOSIS — E669 Obesity, unspecified: Secondary | ICD-10-CM | POA: Diagnosis not present

## 2020-09-18 DIAGNOSIS — Z23 Encounter for immunization: Secondary | ICD-10-CM | POA: Diagnosis not present

## 2020-09-18 DIAGNOSIS — E781 Pure hyperglyceridemia: Secondary | ICD-10-CM | POA: Diagnosis not present

## 2020-09-18 DIAGNOSIS — N1831 Chronic kidney disease, stage 3a: Secondary | ICD-10-CM | POA: Diagnosis not present

## 2020-09-18 DIAGNOSIS — M179 Osteoarthritis of knee, unspecified: Secondary | ICD-10-CM | POA: Diagnosis not present

## 2020-09-18 DIAGNOSIS — I129 Hypertensive chronic kidney disease with stage 1 through stage 4 chronic kidney disease, or unspecified chronic kidney disease: Secondary | ICD-10-CM | POA: Diagnosis not present

## 2020-09-18 DIAGNOSIS — N329 Bladder disorder, unspecified: Secondary | ICD-10-CM | POA: Diagnosis not present

## 2020-09-18 DIAGNOSIS — F411 Generalized anxiety disorder: Secondary | ICD-10-CM | POA: Diagnosis not present

## 2020-09-18 DIAGNOSIS — M8580 Other specified disorders of bone density and structure, unspecified site: Secondary | ICD-10-CM | POA: Diagnosis not present

## 2020-09-18 DIAGNOSIS — E871 Hypo-osmolality and hyponatremia: Secondary | ICD-10-CM | POA: Diagnosis not present

## 2020-09-18 DIAGNOSIS — I700 Atherosclerosis of aorta: Secondary | ICD-10-CM | POA: Diagnosis not present

## 2020-11-07 DIAGNOSIS — M25561 Pain in right knee: Secondary | ICD-10-CM | POA: Diagnosis not present

## 2020-11-28 ENCOUNTER — Telehealth: Payer: Self-pay | Admitting: Physician Assistant

## 2020-11-28 NOTE — Telephone Encounter (Signed)
Pt is requesting some samples of Amitiza that will last her until Dec 29, 2020.

## 2020-11-29 NOTE — Telephone Encounter (Signed)
Left message for patient to call office.  

## 2020-11-30 NOTE — Telephone Encounter (Signed)
Pt returning your call

## 2020-11-30 NOTE — Telephone Encounter (Signed)
Informed patient we do not have any samples available at this time.

## 2020-12-03 DIAGNOSIS — N1831 Chronic kidney disease, stage 3a: Secondary | ICD-10-CM | POA: Diagnosis not present

## 2020-12-03 DIAGNOSIS — I129 Hypertensive chronic kidney disease with stage 1 through stage 4 chronic kidney disease, or unspecified chronic kidney disease: Secondary | ICD-10-CM | POA: Diagnosis not present

## 2020-12-03 DIAGNOSIS — J40 Bronchitis, not specified as acute or chronic: Secondary | ICD-10-CM | POA: Diagnosis not present

## 2020-12-03 DIAGNOSIS — R059 Cough, unspecified: Secondary | ICD-10-CM | POA: Diagnosis not present

## 2020-12-03 DIAGNOSIS — Z1152 Encounter for screening for COVID-19: Secondary | ICD-10-CM | POA: Diagnosis not present

## 2020-12-03 DIAGNOSIS — J069 Acute upper respiratory infection, unspecified: Secondary | ICD-10-CM | POA: Diagnosis not present

## 2020-12-17 DIAGNOSIS — M25561 Pain in right knee: Secondary | ICD-10-CM | POA: Diagnosis not present

## 2021-01-01 ENCOUNTER — Telehealth: Payer: Self-pay | Admitting: Physician Assistant

## 2021-01-01 NOTE — Telephone Encounter (Signed)
Inbound call from Kimberly Mcgee with [facility] pharmacy requesting additional refill for Amitiza medication.  States patient has 60 day refill but wants to change it to a 90 day supply because it would be cheaper for patient.  Please advise.

## 2021-01-02 NOTE — Telephone Encounter (Signed)
Pt is checking on the status of her Amitiza

## 2021-01-03 ENCOUNTER — Telehealth: Payer: Self-pay

## 2021-01-03 MED ORDER — LUBIPROSTONE 8 MCG PO CAPS: ORAL_CAPSULE | ORAL | 1 refills | Status: DC

## 2021-01-03 NOTE — Telephone Encounter (Signed)
Prior authorization for Amitiza completed and approved.  Resent rx to [facility] with this information

## 2021-01-03 NOTE — Addendum Note (Signed)
Addended by: Jeanine Luz on: 01/03/2021 03:56 PM   Modules accepted: Orders

## 2021-01-03 NOTE — Telephone Encounter (Signed)
Patient calling to follow up on previous refill request states [facility] doe snot have the script. Also wants to know if we have any samples at the office for she does not have any medication left

## 2021-01-03 NOTE — Telephone Encounter (Signed)
Sent 90 day supply of Amitiza to [facility]

## 2021-01-03 NOTE — Telephone Encounter (Signed)
Received call from Dhermisha at [facility] pharmacy who needed assistance with prior authorization for Amitiza 8 mg capsules. Needed clarification on constipation diagnoses, she associated the diagnoses of chronic idiopathic constipation, she states that she will go ahead an complete the prior authorization.

## 2021-01-07 DIAGNOSIS — S83281A Other tear of lateral meniscus, current injury, right knee, initial encounter: Secondary | ICD-10-CM | POA: Diagnosis not present

## 2021-01-07 DIAGNOSIS — M1711 Unilateral primary osteoarthritis, right knee: Secondary | ICD-10-CM | POA: Diagnosis not present

## 2021-01-08 ENCOUNTER — Telehealth: Payer: Self-pay | Admitting: Physician Assistant

## 2021-01-08 NOTE — Telephone Encounter (Signed)
Pt states that she had severe abd pain last night. She wants to know if it could be related to a change in her medication.

## 2021-01-08 NOTE — Telephone Encounter (Signed)
Spoke with patient, she states that she started taking the generic Amitiza yesterday, severe abdominal pain - she states that the pain is similar to the pain she was having when she had her gallbladder, she states that the pain started about 10 PM last night and lasted all night, she ate a light dinner - cup of soup and a piece of toast. Patient states that she has been taking the Amitiza with meals as directed, advised her that she should not be having that type of reaction but advised her to try taking Amitiza again but decrease it to once a day to see if she has the same reaction - advised that if she has the abdominal pain again to stop the Amitiza until her upcoming appt and use Miralax in the interim. Advised that if she takes it and does not have the abdominal pain she should continue it.  Patient has been scheduled to see Dr. Hilarie Fredrickson on Thursday, 01/17/21 at 1:50 PM. Patient verbalized understanding of all information and had no concerns at the end of the call.

## 2021-01-17 ENCOUNTER — Ambulatory Visit: Payer: PPO | Admitting: Internal Medicine

## 2021-02-11 DIAGNOSIS — I129 Hypertensive chronic kidney disease with stage 1 through stage 4 chronic kidney disease, or unspecified chronic kidney disease: Secondary | ICD-10-CM | POA: Diagnosis not present

## 2021-02-11 DIAGNOSIS — E1122 Type 2 diabetes mellitus with diabetic chronic kidney disease: Secondary | ICD-10-CM | POA: Diagnosis not present

## 2021-02-11 DIAGNOSIS — E559 Vitamin D deficiency, unspecified: Secondary | ICD-10-CM | POA: Diagnosis not present

## 2021-02-18 DIAGNOSIS — I129 Hypertensive chronic kidney disease with stage 1 through stage 4 chronic kidney disease, or unspecified chronic kidney disease: Secondary | ICD-10-CM | POA: Diagnosis not present

## 2021-02-18 DIAGNOSIS — E871 Hypo-osmolality and hyponatremia: Secondary | ICD-10-CM | POA: Diagnosis not present

## 2021-02-18 DIAGNOSIS — M179 Osteoarthritis of knee, unspecified: Secondary | ICD-10-CM | POA: Diagnosis not present

## 2021-02-18 DIAGNOSIS — Z1331 Encounter for screening for depression: Secondary | ICD-10-CM | POA: Diagnosis not present

## 2021-02-18 DIAGNOSIS — F411 Generalized anxiety disorder: Secondary | ICD-10-CM | POA: Diagnosis not present

## 2021-02-18 DIAGNOSIS — M8580 Other specified disorders of bone density and structure, unspecified site: Secondary | ICD-10-CM | POA: Diagnosis not present

## 2021-02-18 DIAGNOSIS — I700 Atherosclerosis of aorta: Secondary | ICD-10-CM | POA: Diagnosis not present

## 2021-02-18 DIAGNOSIS — R82998 Other abnormal findings in urine: Secondary | ICD-10-CM | POA: Diagnosis not present

## 2021-02-18 DIAGNOSIS — E559 Vitamin D deficiency, unspecified: Secondary | ICD-10-CM | POA: Diagnosis not present

## 2021-02-18 DIAGNOSIS — Z0000 Encounter for general adult medical examination without abnormal findings: Secondary | ICD-10-CM | POA: Diagnosis not present

## 2021-02-18 DIAGNOSIS — E781 Pure hyperglyceridemia: Secondary | ICD-10-CM | POA: Diagnosis not present

## 2021-02-18 DIAGNOSIS — N1831 Chronic kidney disease, stage 3a: Secondary | ICD-10-CM | POA: Diagnosis not present

## 2021-02-18 DIAGNOSIS — E1122 Type 2 diabetes mellitus with diabetic chronic kidney disease: Secondary | ICD-10-CM | POA: Diagnosis not present

## 2021-03-04 DIAGNOSIS — L578 Other skin changes due to chronic exposure to nonionizing radiation: Secondary | ICD-10-CM | POA: Diagnosis not present

## 2021-03-04 DIAGNOSIS — L570 Actinic keratosis: Secondary | ICD-10-CM | POA: Diagnosis not present

## 2021-03-04 DIAGNOSIS — L853 Xerosis cutis: Secondary | ICD-10-CM | POA: Diagnosis not present

## 2021-03-04 DIAGNOSIS — L821 Other seborrheic keratosis: Secondary | ICD-10-CM | POA: Diagnosis not present

## 2021-03-18 DIAGNOSIS — Z1159 Encounter for screening for other viral diseases: Secondary | ICD-10-CM | POA: Diagnosis not present

## 2021-03-18 DIAGNOSIS — Z20828 Contact with and (suspected) exposure to other viral communicable diseases: Secondary | ICD-10-CM | POA: Diagnosis not present

## 2021-03-25 DIAGNOSIS — Z1159 Encounter for screening for other viral diseases: Secondary | ICD-10-CM | POA: Diagnosis not present

## 2021-03-25 DIAGNOSIS — Z20828 Contact with and (suspected) exposure to other viral communicable diseases: Secondary | ICD-10-CM | POA: Diagnosis not present

## 2021-04-01 DIAGNOSIS — Z20828 Contact with and (suspected) exposure to other viral communicable diseases: Secondary | ICD-10-CM | POA: Diagnosis not present

## 2021-04-01 DIAGNOSIS — Z1159 Encounter for screening for other viral diseases: Secondary | ICD-10-CM | POA: Diagnosis not present

## 2021-04-08 DIAGNOSIS — Z1159 Encounter for screening for other viral diseases: Secondary | ICD-10-CM | POA: Diagnosis not present

## 2021-04-08 DIAGNOSIS — Z20828 Contact with and (suspected) exposure to other viral communicable diseases: Secondary | ICD-10-CM | POA: Diagnosis not present

## 2021-04-15 DIAGNOSIS — Z1159 Encounter for screening for other viral diseases: Secondary | ICD-10-CM | POA: Diagnosis not present

## 2021-04-15 DIAGNOSIS — Z20828 Contact with and (suspected) exposure to other viral communicable diseases: Secondary | ICD-10-CM | POA: Diagnosis not present

## 2021-04-16 DIAGNOSIS — L270 Generalized skin eruption due to drugs and medicaments taken internally: Secondary | ICD-10-CM | POA: Diagnosis not present

## 2021-05-13 DIAGNOSIS — Z1159 Encounter for screening for other viral diseases: Secondary | ICD-10-CM | POA: Diagnosis not present

## 2021-05-13 DIAGNOSIS — Z20828 Contact with and (suspected) exposure to other viral communicable diseases: Secondary | ICD-10-CM | POA: Diagnosis not present

## 2021-05-20 DIAGNOSIS — Z1159 Encounter for screening for other viral diseases: Secondary | ICD-10-CM | POA: Diagnosis not present

## 2021-05-20 DIAGNOSIS — Z20828 Contact with and (suspected) exposure to other viral communicable diseases: Secondary | ICD-10-CM | POA: Diagnosis not present

## 2021-05-27 DIAGNOSIS — Z1159 Encounter for screening for other viral diseases: Secondary | ICD-10-CM | POA: Diagnosis not present

## 2021-05-27 DIAGNOSIS — Z20828 Contact with and (suspected) exposure to other viral communicable diseases: Secondary | ICD-10-CM | POA: Diagnosis not present

## 2021-05-30 DIAGNOSIS — M25511 Pain in right shoulder: Secondary | ICD-10-CM | POA: Diagnosis not present

## 2021-06-17 DIAGNOSIS — Z1159 Encounter for screening for other viral diseases: Secondary | ICD-10-CM | POA: Diagnosis not present

## 2021-06-17 DIAGNOSIS — Z20828 Contact with and (suspected) exposure to other viral communicable diseases: Secondary | ICD-10-CM | POA: Diagnosis not present

## 2021-06-20 DIAGNOSIS — M25511 Pain in right shoulder: Secondary | ICD-10-CM | POA: Diagnosis not present

## 2021-07-08 DIAGNOSIS — Z1159 Encounter for screening for other viral diseases: Secondary | ICD-10-CM | POA: Diagnosis not present

## 2021-07-08 DIAGNOSIS — Z20828 Contact with and (suspected) exposure to other viral communicable diseases: Secondary | ICD-10-CM | POA: Diagnosis not present

## 2021-07-15 DIAGNOSIS — M1712 Unilateral primary osteoarthritis, left knee: Secondary | ICD-10-CM | POA: Diagnosis not present

## 2021-07-15 DIAGNOSIS — M25511 Pain in right shoulder: Secondary | ICD-10-CM | POA: Diagnosis not present

## 2021-07-15 DIAGNOSIS — M1711 Unilateral primary osteoarthritis, right knee: Secondary | ICD-10-CM | POA: Diagnosis not present

## 2021-07-15 DIAGNOSIS — M170 Bilateral primary osteoarthritis of knee: Secondary | ICD-10-CM | POA: Diagnosis not present

## 2021-07-22 DIAGNOSIS — Z20828 Contact with and (suspected) exposure to other viral communicable diseases: Secondary | ICD-10-CM | POA: Diagnosis not present

## 2021-07-22 DIAGNOSIS — Z1159 Encounter for screening for other viral diseases: Secondary | ICD-10-CM | POA: Diagnosis not present

## 2021-08-05 DIAGNOSIS — Z8616 Personal history of COVID-19: Secondary | ICD-10-CM | POA: Diagnosis not present

## 2021-08-19 DIAGNOSIS — Z8616 Personal history of COVID-19: Secondary | ICD-10-CM | POA: Diagnosis not present

## 2021-08-23 DIAGNOSIS — D649 Anemia, unspecified: Secondary | ICD-10-CM | POA: Diagnosis not present

## 2021-08-23 DIAGNOSIS — M25561 Pain in right knee: Secondary | ICD-10-CM | POA: Diagnosis not present

## 2021-08-23 DIAGNOSIS — E781 Pure hyperglyceridemia: Secondary | ICD-10-CM | POA: Diagnosis not present

## 2021-08-23 DIAGNOSIS — E871 Hypo-osmolality and hyponatremia: Secondary | ICD-10-CM | POA: Diagnosis not present

## 2021-08-23 DIAGNOSIS — M179 Osteoarthritis of knee, unspecified: Secondary | ICD-10-CM | POA: Diagnosis not present

## 2021-08-23 DIAGNOSIS — I129 Hypertensive chronic kidney disease with stage 1 through stage 4 chronic kidney disease, or unspecified chronic kidney disease: Secondary | ICD-10-CM | POA: Diagnosis not present

## 2021-08-23 DIAGNOSIS — E1122 Type 2 diabetes mellitus with diabetic chronic kidney disease: Secondary | ICD-10-CM | POA: Diagnosis not present

## 2021-08-23 DIAGNOSIS — M8580 Other specified disorders of bone density and structure, unspecified site: Secondary | ICD-10-CM | POA: Diagnosis not present

## 2021-08-23 DIAGNOSIS — E669 Obesity, unspecified: Secondary | ICD-10-CM | POA: Diagnosis not present

## 2021-08-23 DIAGNOSIS — N1831 Chronic kidney disease, stage 3a: Secondary | ICD-10-CM | POA: Diagnosis not present

## 2021-08-23 DIAGNOSIS — I700 Atherosclerosis of aorta: Secondary | ICD-10-CM | POA: Diagnosis not present

## 2021-08-23 DIAGNOSIS — N329 Bladder disorder, unspecified: Secondary | ICD-10-CM | POA: Diagnosis not present

## 2021-08-23 DIAGNOSIS — F411 Generalized anxiety disorder: Secondary | ICD-10-CM | POA: Diagnosis not present

## 2021-09-02 DIAGNOSIS — Z8616 Personal history of COVID-19: Secondary | ICD-10-CM | POA: Diagnosis not present

## 2021-09-16 DIAGNOSIS — Z8616 Personal history of COVID-19: Secondary | ICD-10-CM | POA: Diagnosis not present

## 2021-09-23 DIAGNOSIS — Z20828 Contact with and (suspected) exposure to other viral communicable diseases: Secondary | ICD-10-CM | POA: Diagnosis not present

## 2021-09-30 DIAGNOSIS — Z20828 Contact with and (suspected) exposure to other viral communicable diseases: Secondary | ICD-10-CM | POA: Diagnosis not present

## 2021-10-21 DIAGNOSIS — Z20828 Contact with and (suspected) exposure to other viral communicable diseases: Secondary | ICD-10-CM | POA: Diagnosis not present

## 2021-10-28 DIAGNOSIS — Z20828 Contact with and (suspected) exposure to other viral communicable diseases: Secondary | ICD-10-CM | POA: Diagnosis not present

## 2021-11-04 DIAGNOSIS — Z20828 Contact with and (suspected) exposure to other viral communicable diseases: Secondary | ICD-10-CM | POA: Diagnosis not present

## 2021-11-11 DIAGNOSIS — Z20828 Contact with and (suspected) exposure to other viral communicable diseases: Secondary | ICD-10-CM | POA: Diagnosis not present

## 2021-11-18 DIAGNOSIS — Z20822 Contact with and (suspected) exposure to covid-19: Secondary | ICD-10-CM | POA: Diagnosis not present

## 2021-12-02 DIAGNOSIS — M170 Bilateral primary osteoarthritis of knee: Secondary | ICD-10-CM | POA: Diagnosis not present

## 2021-12-09 DIAGNOSIS — Z1159 Encounter for screening for other viral diseases: Secondary | ICD-10-CM | POA: Diagnosis not present

## 2021-12-09 DIAGNOSIS — Z20828 Contact with and (suspected) exposure to other viral communicable diseases: Secondary | ICD-10-CM | POA: Diagnosis not present

## 2021-12-25 DIAGNOSIS — Z1159 Encounter for screening for other viral diseases: Secondary | ICD-10-CM | POA: Diagnosis not present

## 2021-12-25 DIAGNOSIS — Z20828 Contact with and (suspected) exposure to other viral communicable diseases: Secondary | ICD-10-CM | POA: Diagnosis not present

## 2021-12-30 DIAGNOSIS — Z20822 Contact with and (suspected) exposure to covid-19: Secondary | ICD-10-CM | POA: Diagnosis not present

## 2022-01-01 ENCOUNTER — Other Ambulatory Visit (HOSPITAL_COMMUNITY): Payer: Self-pay

## 2022-01-01 DIAGNOSIS — Z1159 Encounter for screening for other viral diseases: Secondary | ICD-10-CM | POA: Diagnosis not present

## 2022-01-01 DIAGNOSIS — Z20828 Contact with and (suspected) exposure to other viral communicable diseases: Secondary | ICD-10-CM | POA: Diagnosis not present

## 2022-01-06 DIAGNOSIS — Z20828 Contact with and (suspected) exposure to other viral communicable diseases: Secondary | ICD-10-CM | POA: Diagnosis not present

## 2022-01-20 DIAGNOSIS — Z20828 Contact with and (suspected) exposure to other viral communicable diseases: Secondary | ICD-10-CM | POA: Diagnosis not present

## 2022-01-27 DIAGNOSIS — Z20828 Contact with and (suspected) exposure to other viral communicable diseases: Secondary | ICD-10-CM | POA: Diagnosis not present

## 2022-02-20 DIAGNOSIS — I129 Hypertensive chronic kidney disease with stage 1 through stage 4 chronic kidney disease, or unspecified chronic kidney disease: Secondary | ICD-10-CM | POA: Diagnosis not present

## 2022-02-20 DIAGNOSIS — N1831 Chronic kidney disease, stage 3a: Secondary | ICD-10-CM | POA: Diagnosis not present

## 2022-02-20 DIAGNOSIS — K5900 Constipation, unspecified: Secondary | ICD-10-CM | POA: Diagnosis not present

## 2022-02-20 DIAGNOSIS — E781 Pure hyperglyceridemia: Secondary | ICD-10-CM | POA: Diagnosis not present

## 2022-02-20 DIAGNOSIS — R7989 Other specified abnormal findings of blood chemistry: Secondary | ICD-10-CM | POA: Diagnosis not present

## 2022-02-20 DIAGNOSIS — E1122 Type 2 diabetes mellitus with diabetic chronic kidney disease: Secondary | ICD-10-CM | POA: Diagnosis not present

## 2022-02-20 DIAGNOSIS — E559 Vitamin D deficiency, unspecified: Secondary | ICD-10-CM | POA: Diagnosis not present

## 2022-03-03 DIAGNOSIS — M170 Bilateral primary osteoarthritis of knee: Secondary | ICD-10-CM | POA: Diagnosis not present

## 2022-03-06 DIAGNOSIS — H903 Sensorineural hearing loss, bilateral: Secondary | ICD-10-CM | POA: Diagnosis not present

## 2022-03-10 DIAGNOSIS — N1831 Chronic kidney disease, stage 3a: Secondary | ICD-10-CM | POA: Diagnosis not present

## 2022-03-10 DIAGNOSIS — G4700 Insomnia, unspecified: Secondary | ICD-10-CM | POA: Diagnosis not present

## 2022-03-10 DIAGNOSIS — E1122 Type 2 diabetes mellitus with diabetic chronic kidney disease: Secondary | ICD-10-CM | POA: Diagnosis not present

## 2022-03-10 DIAGNOSIS — M8580 Other specified disorders of bone density and structure, unspecified site: Secondary | ICD-10-CM | POA: Diagnosis not present

## 2022-03-10 DIAGNOSIS — R7989 Other specified abnormal findings of blood chemistry: Secondary | ICD-10-CM | POA: Diagnosis not present

## 2022-03-10 DIAGNOSIS — R82998 Other abnormal findings in urine: Secondary | ICD-10-CM | POA: Diagnosis not present

## 2022-03-10 DIAGNOSIS — R413 Other amnesia: Secondary | ICD-10-CM | POA: Diagnosis not present

## 2022-03-10 DIAGNOSIS — E559 Vitamin D deficiency, unspecified: Secondary | ICD-10-CM | POA: Diagnosis not present

## 2022-03-10 DIAGNOSIS — Z0000 Encounter for general adult medical examination without abnormal findings: Secondary | ICD-10-CM | POA: Diagnosis not present

## 2022-03-10 DIAGNOSIS — I129 Hypertensive chronic kidney disease with stage 1 through stage 4 chronic kidney disease, or unspecified chronic kidney disease: Secondary | ICD-10-CM | POA: Diagnosis not present

## 2022-03-10 DIAGNOSIS — Z1331 Encounter for screening for depression: Secondary | ICD-10-CM | POA: Diagnosis not present

## 2022-03-10 DIAGNOSIS — Z1389 Encounter for screening for other disorder: Secondary | ICD-10-CM | POA: Diagnosis not present

## 2022-03-10 DIAGNOSIS — E871 Hypo-osmolality and hyponatremia: Secondary | ICD-10-CM | POA: Diagnosis not present

## 2022-03-10 DIAGNOSIS — E781 Pure hyperglyceridemia: Secondary | ICD-10-CM | POA: Diagnosis not present

## 2022-03-10 DIAGNOSIS — M25561 Pain in right knee: Secondary | ICD-10-CM | POA: Diagnosis not present

## 2022-04-06 ENCOUNTER — Other Ambulatory Visit: Payer: Self-pay | Admitting: Internal Medicine

## 2022-04-08 ENCOUNTER — Telehealth: Payer: Self-pay | Admitting: Physician Assistant

## 2022-04-08 NOTE — Telephone Encounter (Signed)
Patient called to schedule an OV to discuss her medication AMITIZA. Per patient, that medication doesn't work for her. She states that she currently has seven more doses left. She was wondering if there is an alternative medication she can try. Please advise.  ?

## 2022-04-09 NOTE — Telephone Encounter (Signed)
Patient calling for update from message yesterday. Also, was scheduled to see Anderson Malta on 4/19 at 9:00. ?

## 2022-04-10 MED ORDER — LUBIPROSTONE 8 MCG PO CAPS: ORAL_CAPSULE | ORAL | 0 refills | Status: DC

## 2022-04-10 NOTE — Telephone Encounter (Signed)
Amitiza refilled per patient's request ?

## 2022-04-11 ENCOUNTER — Other Ambulatory Visit: Payer: Self-pay

## 2022-04-11 MED ORDER — LUBIPROSTONE 8 MCG PO CAPS: ORAL_CAPSULE | ORAL | 0 refills | Status: DC

## 2022-04-11 NOTE — Telephone Encounter (Signed)
Patient called regarding Amitiza medication. Per patient, the pharmacy still has not received script. Please advise.  ?

## 2022-04-11 NOTE — Telephone Encounter (Signed)
Per patient, can we call her once the script has been sent so she can pick it up asap.  ?

## 2022-04-11 NOTE — Telephone Encounter (Signed)
Called pharmacy because computer showed that they received the rx at 8:36am yesterday.  They told me they did not show the refill request in the system and in fact gotten several phone calls that made it seem like something was wrong with their system.  I gave the pharmacist the refill request over the phone to avoid the same thing happening again.  LM on VM communicating this information ?

## 2022-04-15 DIAGNOSIS — H903 Sensorineural hearing loss, bilateral: Secondary | ICD-10-CM | POA: Diagnosis not present

## 2022-04-16 ENCOUNTER — Ambulatory Visit: Payer: PPO | Admitting: Physician Assistant

## 2022-04-16 ENCOUNTER — Encounter: Payer: Self-pay | Admitting: Physician Assistant

## 2022-04-16 ENCOUNTER — Ambulatory Visit (INDEPENDENT_AMBULATORY_CARE_PROVIDER_SITE_OTHER)
Admission: RE | Admit: 2022-04-16 | Discharge: 2022-04-16 | Disposition: A | Discharge status: Home / Self Care | Payer: PPO | Source: Ambulatory Visit | Attending: Physician Assistant | Admitting: Physician Assistant

## 2022-04-16 VITALS — BP 124/80 | HR 64 | Ht 61.0 in | Wt 167.8 lb

## 2022-04-16 DIAGNOSIS — M47816 Spondylosis without myelopathy or radiculopathy, lumbar region: Secondary | ICD-10-CM | POA: Diagnosis not present

## 2022-04-16 DIAGNOSIS — K5900 Constipation, unspecified: Secondary | ICD-10-CM | POA: Diagnosis not present

## 2022-04-16 DIAGNOSIS — K529 Noninfective gastroenteritis and colitis, unspecified: Secondary | ICD-10-CM | POA: Diagnosis not present

## 2022-04-16 DIAGNOSIS — M4186 Other forms of scoliosis, lumbar region: Secondary | ICD-10-CM | POA: Diagnosis not present

## 2022-04-16 DIAGNOSIS — K5909 Other constipation: Secondary | ICD-10-CM

## 2022-04-16 MED ORDER — LINACLOTIDE 72 MCG PO CAPS: 72.0000 ug | ORAL_CAPSULE | Freq: Every day | ORAL | 0 refills | Status: DC

## 2022-04-16 NOTE — Patient Instructions (Signed)
Your provider has requested that you have an abdominal x ray before leaving today. Please go to the basement floor to our Radiology department for the test. ? ?We have given you samples of the following medication to take: ?Linzess 72 mcg daily before breakfast, hold until you hear from [place], [place].  ? ?Continue Amitiza for now. ? ?If you are age 86 or older, your body mass index should be between 23-30. Your Body mass index is 31.71 kg/m?Marland Kitchen If this is out of the aforementioned range listed, please consider follow up with your Primary Care Provider. ? ?If you are age 3 or younger, your body mass index should be between 19-25. Your Body mass index is 31.71 kg/m?Marland Kitchen If this is out of the aformentioned range listed, please consider follow up with your Primary Care Provider.  ? ?________________________________________________________ ? ?The [facility] providers would like to encourage you to use [facility] to communicate with providers for non-urgent requests or questions.  Due to long hold times on the telephone, sending your provider a message by [facility] may be a faster and more efficient way to get a response.  Please allow 48 business hours for a response.  Please remember that this is for non-urgent requests.  ?_______________________________________________________ ? ?

## 2022-04-16 NOTE — Progress Notes (Signed)
? ?Chief Complaint: Chronic constipation ? ?HPI: ?   [name] is a 88 year old female with a past medical history as listed below, known to Dr. Hilarie Fredrickson, who was referred to me by Shon Baton, MD for a complaint of chronic constipation. ?   06/29/2019 flex sig with anal stricture found on digital rectal exam with manual dilation done during DRE.   ?   09/27/2019 office visit for constipation.  At that time was following up after being seen on 9/9 and starting Amitiza 8 mcg twice a day and MiraLAX once a day.  At that visit described that she was 99% better when taking this medication with MiraLAX.  She was continued on Amitiza 8 mcg twice a day and MiraLAX once daily. ?   04/08/2022 patient received a refill of Amitiza, but had called and told our office that this medicine was not working for her any longer. ?   Today, the patient tells me that she was changed to generic Amitiza by her pharmacy (Lubiprostone 8 mcg twice a day) and this is still very expensive for her, she does not feel like it is working like it used to.  Over the past 6 months she has noticed that oftentimes she will have to strain for a solid bowel movement in the morning and then a few hours later will have explosive diarrhea that is very urgent.  In fact 3 times she has had accidents and now is getting afraid to go out of the house.  Tells me that she only takes MiraLAX if she feels very constipated and has not had a bowel movement for 3 days and is getting bloated.  This does happen occasionally. ?   Denies fever, chills, weight loss, nausea, vomiting, blood in her stool or abdominal pain. ? ?Past Medical History:  ?Diagnosis Date  ? Arthritis   ? right knee  ? Bladder prolapse, female, acquired   ? Gallstones   ? Hypertension   ? ? ?Past Surgical History:  ?Procedure Laterality Date  ? ABDOMINAL HYSTERECTOMY    ? CHOLECYSTECTOMY N/A 08/23/2019  ? Procedure: LAPAROSCOPIC CHOLECYSTECTOMY;  Surgeon: Rolm Bookbinder, MD;  Location: [facility];  Service:  General;  Laterality: N/A;  GENERAL WITH TAP BLOCK BILATERALLY  ? HEMORROIDECTOMY    ? x 2  ? KNEE SURGERY    ? ? ?Current Outpatient Medications  ?Medication Sig Dispense Refill  ? Calcium Carbonate-Vitamin D (CALCIUM 600 + D PO) Take 1 tablet by mouth 2 (two) times daily.      ? citalopram (CELEXA) 20 MG tablet Take 20 mg by mouth daily after breakfast.     ? losartan-hydrochlorothiazide (HYZAAR) 100-25 MG per tablet Take 1 tablet by mouth daily after breakfast.     ? lubiprostone (AMITIZA) 8 MCG capsule TAKE ONE CAPSULE BY MOUTH TWICE DAILY WITH MEALS 60 capsule 0  ? Melatonin 5 MG TABS Take 5 mg by mouth at bedtime as needed (sleep).    ? Misc Natural Products (COSAMIN ASU ADVANCED FORMULA PO) Take 1 tablet by mouth 2 (two) times daily.    ? Multiple Vitamins-Minerals (ONE-A-DAY WOMENS 50 PLUS PO) Take 1 tablet by mouth daily.      ? naproxen sodium (ALEVE) 220 MG tablet Take 440 mg by mouth 2 (two) times daily as needed (pain).    ? Omega-3 Fatty Acids (FISH OIL PO) Take 1 capsule by mouth 2 (two) times daily.     ? polyethylene glycol powder (GLYCOLAX/MIRALAX) 17 GM/SCOOP powder Take  17 g by mouth daily as needed.    ? aspirin EC 81 MG tablet Take 81 mg by mouth every other day. (Patient not taking: Reported on 04/16/2022)    ? ?No current facility-administered medications for this visit.  ? ? ?Allergies as of 04/16/2022  ? (No Known Allergies)  ? ? ?Family History  ?Problem Relation Age of Onset  ? Uterine cancer Mother   ? Liver disease Neg Hx   ? Colon cancer Neg Hx   ? Pancreatic cancer Neg Hx   ? Stomach cancer Neg Hx   ? Rectal cancer Neg Hx   ? Esophageal cancer Neg Hx   ? ? ?Social History  ? ?Socioeconomic History  ? Marital status: Widowed  ?  Spouse name: Not on file  ? Number of children: 3  ? Years of education: Not on file  ? Highest education level: Not on file  ?Occupational History  ? Not on file  ?Tobacco Use  ? Smoking status: Never  ? Smokeless tobacco: Never  ?Vaping Use  ? Vaping Use: Not  on file  ?Substance and Sexual Activity  ? Alcohol use: No  ? Drug use: No  ? Sexual activity: Never  ?Other Topics Concern  ? Not on file  ?Social History Narrative  ? Not on file  ? ?Social Determinants of Health  ? ?Financial Resource Strain: Not on file  ?Food Insecurity: Not on file  ?Transportation Needs: Not on file  ?Physical Activity: Not on file  ?Stress: Not on file  ?Social Connections: Not on file  ?Intimate Partner Violence: Not on file  ? ? ?Review of Systems:    ?Constitutional: No weight loss, fever or chills ?Cardiovascular: No chest pain ?Respiratory: No SOB  ?Gastrointestinal: See HPI and otherwise negative ? ? Physical Exam:  ?Vital signs: ?BP 124/80   Pulse 64   Ht '5\' 1"'$  (1.549 m)   Wt 167 lb 12.8 oz (76.1 kg)   SpO2 97%   BMI 31.71 kg/m?   ? ?Constitutional:   Pleasant Elderly Caucasian female appears to be in NAD, Well developed, Well nourished, alert and cooperative ?Respiratory: Respirations even and unlabored. Lungs clear to auscultation bilaterally.   No wheezes, crackles, or rhonchi.  ?Cardiovascular: Normal S1, S2. No MRG. Regular rate and rhythm. No peripheral edema, cyanosis or pallor.  ?Gastrointestinal:  Soft, nondistended, nontender. No rebound or guarding.  Increased bowel sounds all 4 quadrants. No appreciable masses or hepatomegaly. ?Rectal:  Not performed.  ?Psychiatric: Demonstrates good judgement and reason without abnormal affect or behaviors. ? ?No recent labs or imaging. ? ?Assessment: ?1.  Chronic constipation: Now with sometimes more than 3 days with no bowel movement and straining for stools in the morning followed by urgent explosive diarrhea, also history of anal stricture last dilated/stretched in 2020; consider continued chronic constipation with overflow +/- anal stricture contributing ? ?Plan: ?1.  Discussed with patient that I feel that likely she is suffering from constipation with overflow incontinence/diarrhea.  I ordered a 2 view x-ray today to see the  status of things.  Explained that pending this we may need to increase her laxative regimen. ?2.  Patient also expressed concern over price of Amitiza.  Provided her with samples of Linzess 72 mcg daily.  She can try these for 8 days to see if they work for her.  Told her to wait to try this until after results from x-ray. ?3.  Pending course, may need repeat flex sig with dilation of  stricture. ?4.  Patient to follow in clinic with me in 3 to 4 weeks. ? ?Ellouise Newer, PA-C ?[facility] ?04/16/2022, 8:59 AM ? ?Cc: Shon Baton, MD  ?

## 2022-04-18 NOTE — Progress Notes (Signed)
Addendum: ?Reviewed and agree with assessment and management plan. ?Agree with plans to repeat flex sig with MAC for manual dilation of anal stenosis if medical therapy incompletely effective ?Perl Folmar, Lajuan Lines, MD ? ?

## 2022-04-23 DIAGNOSIS — H353132 Nonexudative age-related macular degeneration, bilateral, intermediate dry stage: Secondary | ICD-10-CM | POA: Diagnosis not present

## 2022-04-23 DIAGNOSIS — H02403 Unspecified ptosis of bilateral eyelids: Secondary | ICD-10-CM | POA: Diagnosis not present

## 2022-04-23 DIAGNOSIS — H524 Presbyopia: Secondary | ICD-10-CM | POA: Diagnosis not present

## 2022-04-23 DIAGNOSIS — E119 Type 2 diabetes mellitus without complications: Secondary | ICD-10-CM | POA: Diagnosis not present

## 2022-04-24 ENCOUNTER — Telehealth: Payer: Self-pay | Admitting: Physician Assistant

## 2022-04-24 MED ORDER — LINACLOTIDE 145 MCG PO CAPS: 145.0000 ug | ORAL_CAPSULE | Freq: Every day | ORAL | 0 refills | Status: DC

## 2022-04-24 NOTE — Telephone Encounter (Signed)
Lets try Linzess 145 mcg daily.  She can either come by and get samples or we can send her in a 30-day supply.  Remind her that she can also use MiraLAX up to 4 times a day if she does not feel like the Linzess is working by itself.  Dr. Hilarie Fredrickson did say that she could have a repeat flex sig with manual dilation of her known stricture if medical therapy is not helping.  Please let her know.  If she is interested we can go ahead and set that up in the [place].  ?If patient truly feels terrible then she needs to go to the ER.  ?Thanks, JL L  ? ?Called and spoke with patient regarding Jennifer's recommendations. Pt states that she would like to try samples of Linzess 145 mcg, she is aware that I will give her enough for a 2 week supply. Pt advised to pick up samples from 2nd floor receptionist desk anytime before 4:30 pm. Pt knows to call back if the higher dose works and we can get a prescription sent in for her. Pt has been advised that she can take Miralax up to 4 times a day PRN if Linzess is not working by itself. Pt has been advised to keep her f/u appt as scheduled with Anderson Malta in May. Pt knows to go to the ED if she feels terrible. Pt verbalized understanding of all information and had no concerns at the end of the call. ?

## 2022-04-24 NOTE — Telephone Encounter (Signed)
Patient called this morning stating she only had 1 Linzess tablet left and didn't want to head through the weekend without anything.  She said it is not really working well for her and would like to know what she can do.  She will either need a refill of the Linzess or an alternative.  On the night of the 25th, she said she was in severe pain (like when she had her gallbladder) and gagged so hard her ribs are hurting.  She said this morning her abdomen is hard to the touch, but with no pain,.  She would like a phone call today to give her suggestions. ?

## 2022-05-05 DIAGNOSIS — H903 Sensorineural hearing loss, bilateral: Secondary | ICD-10-CM | POA: Diagnosis not present

## 2022-05-06 NOTE — Telephone Encounter (Signed)
We received a call from patient stating that the linzess 2 week trial has been working for her. Per patient, still bloated but BM's are better. Patient is requesting another 2 week trial of linzess. She states she can pick them up from our office. Please advise. ?

## 2022-05-07 NOTE — Telephone Encounter (Signed)
Inbound call from patient stating she has one more pill of Linzess and is wanting to come pick up more samples to try for the next week to see if they are truly working. Patient will be completely out tomorrow and is wanting to come pick the samples up today. Please advise.  ?

## 2022-05-08 NOTE — Telephone Encounter (Signed)
Samples placed up front and patient informed. ?

## 2022-05-12 IMAGING — DX DG ABDOMEN 2V
2 series · 2 of 2 positions shown · non-contrast
Comparison: None.

CLINICAL DATA: Chronic constipation, diarrhea

EXAM:
ABDOMEN - 2 VIEW

[abdomen erect]
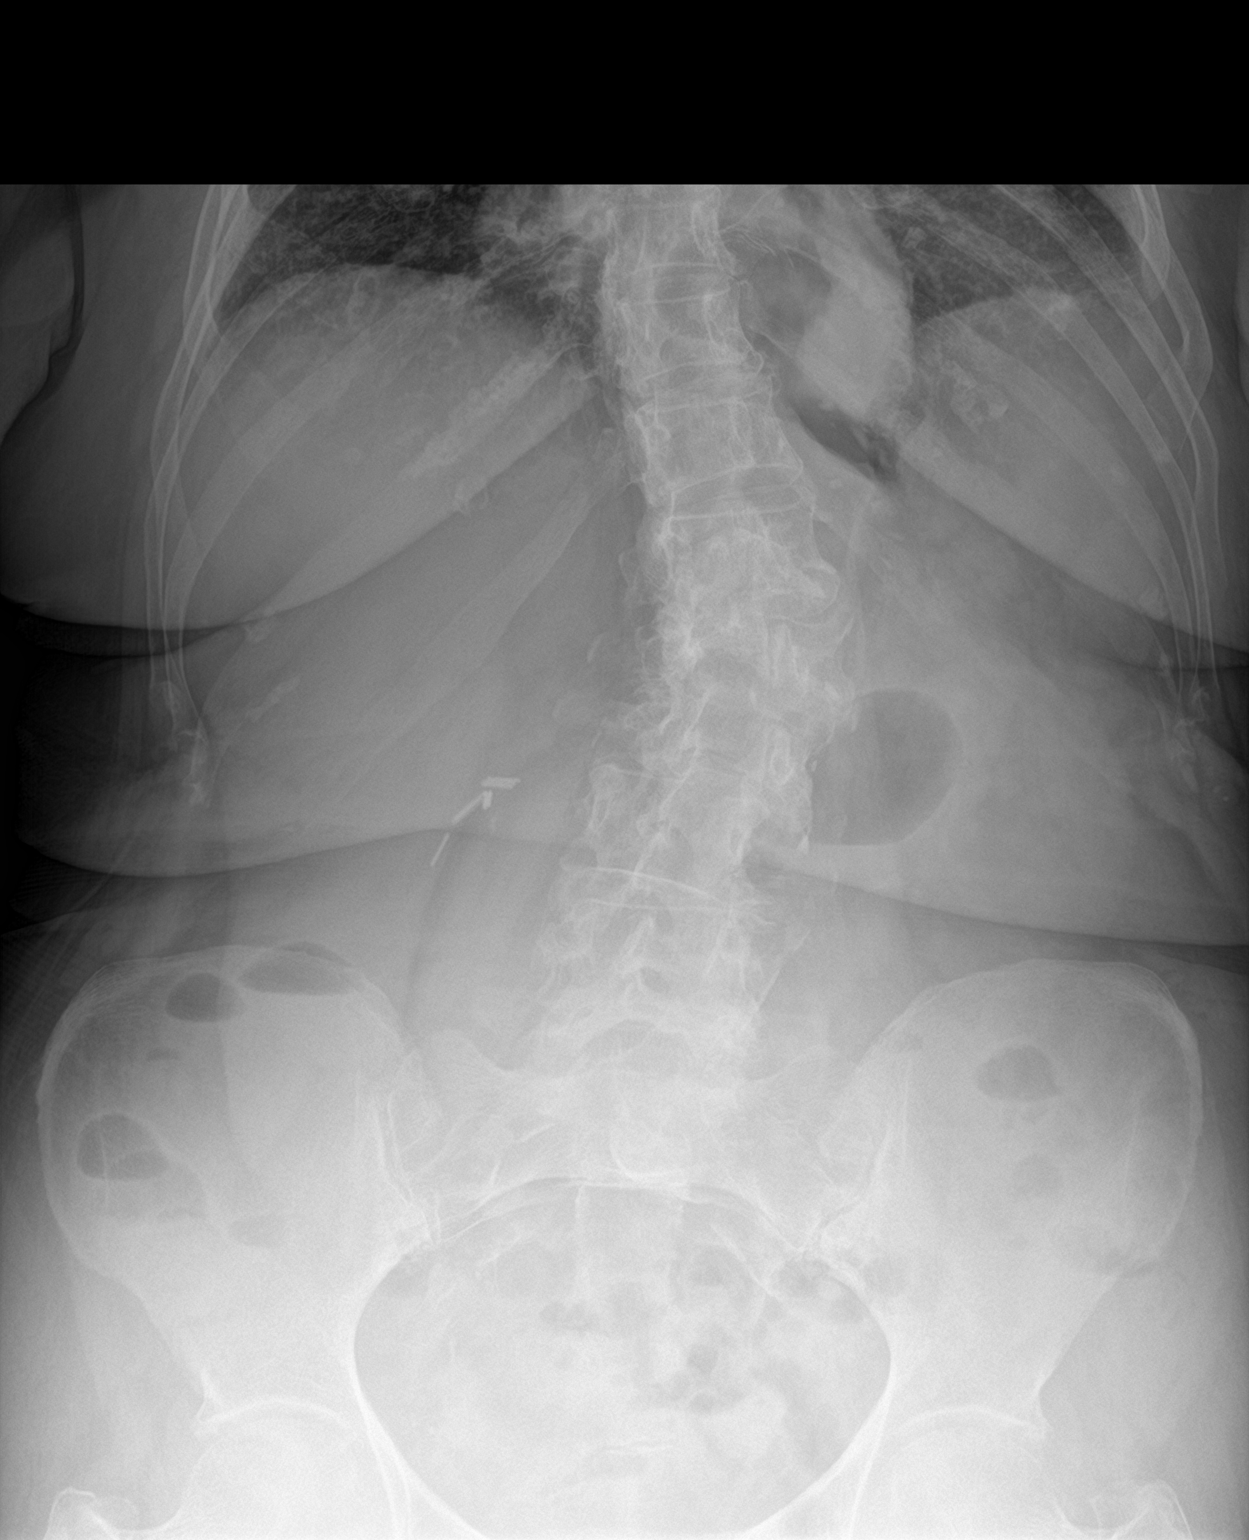

[abdomen supine]
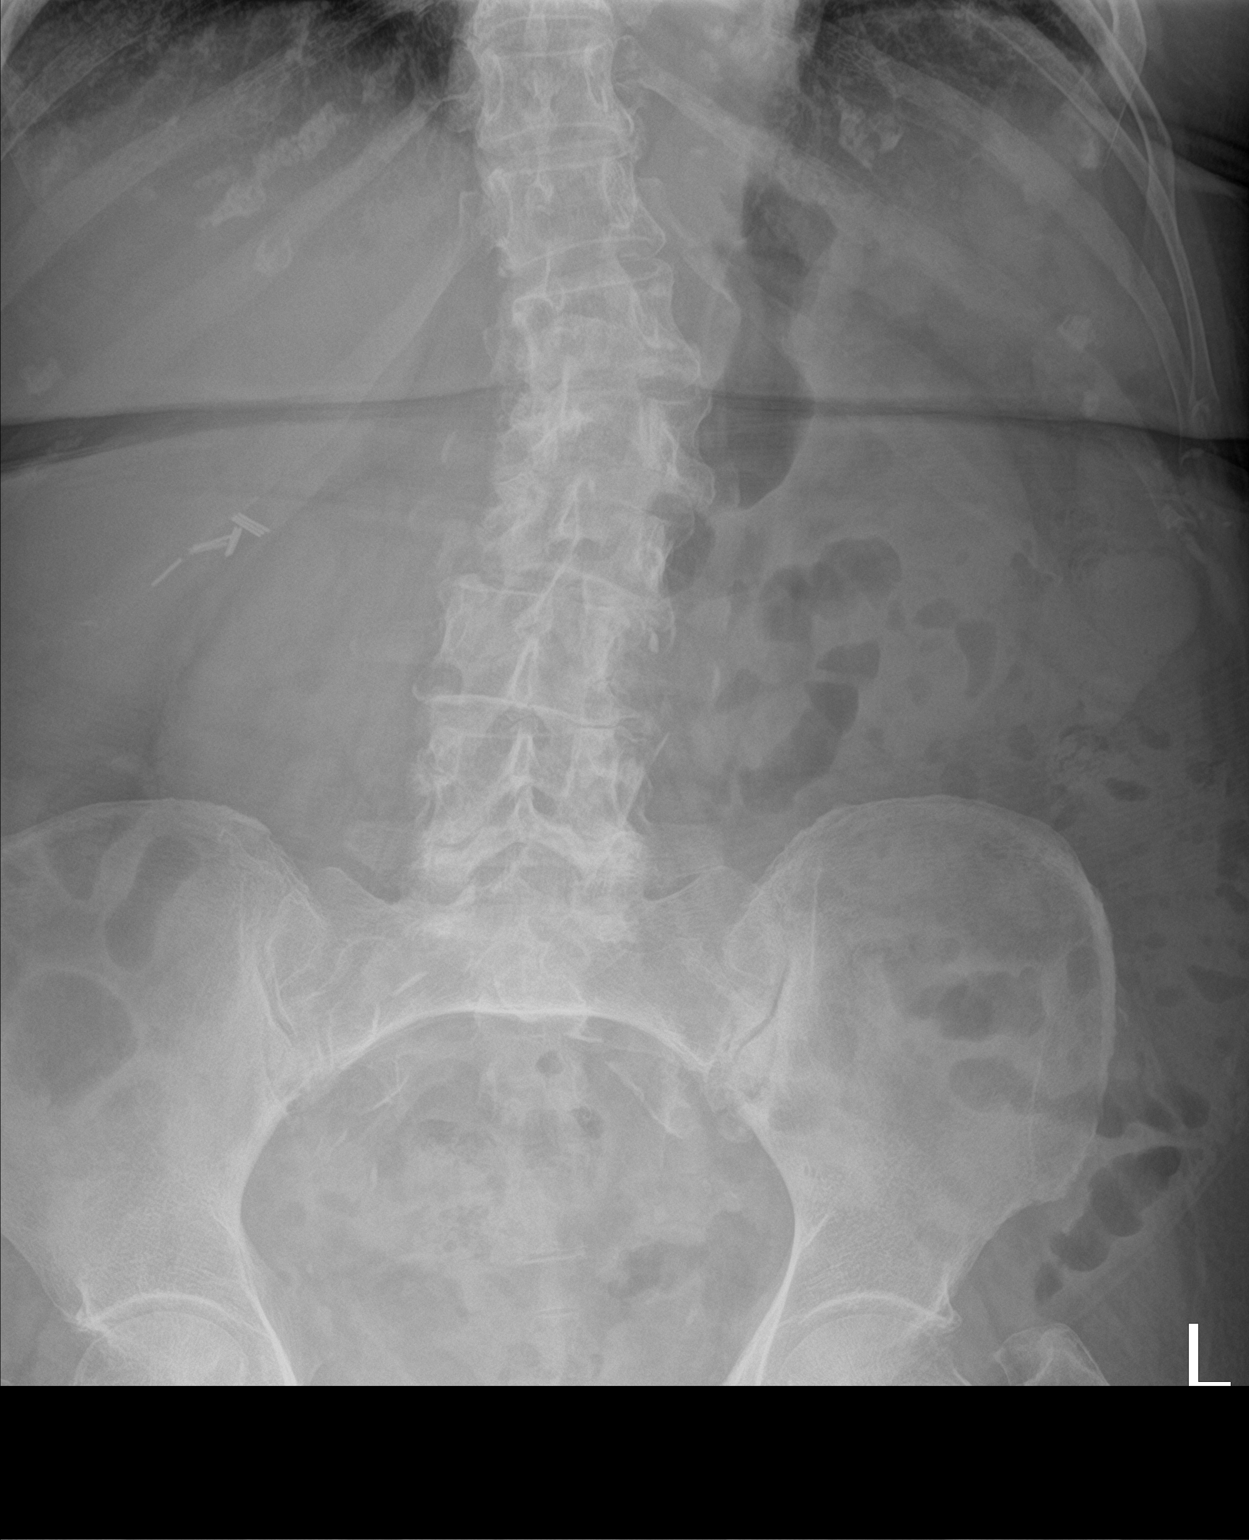

[2 of 2 positions shown; findings below may reference images not displayed]

FINDINGS: The bowel gas pattern is normal. Small stool burden. There is no
evidence of free air. Cholecystectomy clips in the right upper
quadrant. Hepatomegaly with the liver measuring up to 24 cm in
craniocaudal dimension. Degenerative changes are seen within the
lumbar spine with superimposed mild lumbar levoscoliosis.
IMPRESSION: Normal abdominal gas pattern.

Moderate hepatomegaly.

## 2022-05-21 ENCOUNTER — Ambulatory Visit: Payer: PPO | Admitting: Physician Assistant

## 2022-05-21 ENCOUNTER — Encounter: Payer: Self-pay | Admitting: Physician Assistant

## 2022-05-21 VITALS — BP 124/70 | HR 78 | Ht 61.0 in | Wt 167.0 lb

## 2022-05-21 DIAGNOSIS — K5909 Other constipation: Secondary | ICD-10-CM | POA: Diagnosis not present

## 2022-05-21 NOTE — Progress Notes (Signed)
Chief Complaint: Follow-up constipation  HPI:    Kimberly Mcgee is a 88 year old female with a past medical history as listed below, known to Dr. Hilarie Fredrickson, who presents to clinic today for follow-up of chronic constipation.    06/29/2019 flex sig with anal stricture found on digital rectal exam with manual dilation done during DRE.      09/27/2019 office visit for constipation.  At that time was following up after being seen on 9/9 and starting Amitiza 8 mcg twice a day and MiraLAX once a day.  At that visit described that she was 99% better when taking this medication with MiraLAX.  She was continued on Amitiza 8 mcg twice a day and MiraLAX once daily.    04/08/2022 patient received a refill of Amitiza, but had called and told our office that this medicine was not working for her any longer.    04/16/2022 patient described that she was changed to generic Amitiza and it was still very expensive and not working like it used to.  She describes straining for solid bowel movements and some fecal incontinence.  She was taking MiraLAX.  At that time patient had a 2 view abdominal x-ray.  Provided her with samples of Linzess 72 mcg daily to see if this helps.  Did discuss possibly repeating flex sig with dilation of stricture if needed.    04/17/2022 abdominal x-ray with only small stool burden and moderate hepatomegaly.  That time she was instructed to start the Linzess 72 mcg daily.    Today, the patient presents to clinic and tells me that the Linzess 145 mcg daily is working fairly well for her.  The only problem is she will have 2 bowel movements now every morning, she will have a good 1 and feel like she is completely emptied and then about 1 to 2 hours later will have a watery more urgent stool.  Tells me as far as the straining goes she now only has very rare episodes of having to strain to pass the stool.  In general she feels a lot better than she did before with no further fecal incontinence but the second  round in the morning does somewhat inconvenience.  She has to stay home until she is fully done passing stool.  Also complains that the Linzess is about $1600 for 90-day supply.    Denies fever, chills, weight loss or blood in her stool.  Past Medical History:  Diagnosis Date   Arthritis    right knee   Bladder prolapse, female, acquired    Gallstones    Hypertension     Past Surgical History:  Procedure Laterality Date   ABDOMINAL HYSTERECTOMY     CHOLECYSTECTOMY N/A 08/23/2019   Procedure: LAPAROSCOPIC CHOLECYSTECTOMY;  Surgeon: Rolm Bookbinder, MD;  Location: [place];  Service: General;  Laterality: N/A;  GENERAL WITH TAP BLOCK BILATERALLY   HEMORROIDECTOMY     x 2   KNEE SURGERY      Current Outpatient Medications  Medication Sig Dispense Refill   aspirin EC 81 MG tablet Take 81 mg by mouth every other day. (Patient not taking: Reported on 04/16/2022)     Calcium Carbonate-Vitamin D (CALCIUM 600 + D PO) Take 1 tablet by mouth 2 (two) times daily.       citalopram (CELEXA) 20 MG tablet Take 20 mg by mouth daily after breakfast.      linaclotide (LINZESS) 145 MCG CAPS capsule Take 1 capsule (145 mcg total) by mouth daily  before breakfast. LOT# [id number] EXP: 05/2022 12 capsule 0   losartan-hydrochlorothiazide (HYZAAR) 100-25 MG per tablet Take 1 tablet by mouth daily after breakfast.      lubiprostone (AMITIZA) 8 MCG capsule TAKE ONE CAPSULE BY MOUTH TWICE DAILY WITH MEALS 60 capsule 0   Melatonin 5 MG TABS Take 5 mg by mouth at bedtime as needed (sleep).     Misc Natural Products (COSAMIN ASU ADVANCED FORMULA PO) Take 1 tablet by mouth 2 (two) times daily.     Multiple Vitamins-Minerals (ONE-A-DAY WOMENS 50 PLUS PO) Take 1 tablet by mouth daily.       naproxen sodium (ALEVE) 220 MG tablet Take 440 mg by mouth 2 (two) times daily as needed (pain).     Omega-3 Fatty Acids (FISH OIL PO) Take 1 capsule by mouth 2 (two) times daily.      polyethylene glycol powder (GLYCOLAX/MIRALAX) 17  GM/SCOOP powder Take 17 g by mouth daily as needed.     No current facility-administered medications for this visit.    Allergies as of 05/21/2022   (No Known Allergies)    Family History  Problem Relation Age of Onset   Uterine cancer Mother    Liver disease Neg Hx    Colon cancer Neg Hx    Pancreatic cancer Neg Hx    Stomach cancer Neg Hx    Rectal cancer Neg Hx    Esophageal cancer Neg Hx     Social History   Socioeconomic History   Marital status: Widowed    Spouse name: Not on file   Number of children: 3   Years of education: Not on file   Highest education level: Not on file  Occupational History   Not on file  Tobacco Use   Smoking status: Never   Smokeless tobacco: Never  Vaping Use   Vaping Use: Not on file  Substance and Sexual Activity   Alcohol use: No   Drug use: No   Sexual activity: Never  Other Topics Concern   Not on file  Social History Narrative   Not on file   Social Determinants of Health   Financial Resource Strain: Not on file  Food Insecurity: Not on file  Transportation Needs: Not on file  Physical Activity: Not on file  Stress: Not on file  Social Connections: Not on file  Intimate Partner Violence: Not on file    Review of Systems:    Constitutional: No weight loss, fever or chills Cardiovascular: No chest pain  Respiratory: No SOB  Gastrointestinal: See HPI and otherwise negative   Physical Exam:  Vital signs: BP 124/70   Pulse 78   Ht '5\' 1"'$  (1.549 m)   Wt 167 lb (75.8 kg)   BMI 31.55 kg/m    Constitutional:   Pleasant Elderly Caucasian female appears to be in NAD, Well developed, Well nourished, alert and cooperative Respiratory: Respirations even and unlabored. Lungs clear to auscultation bilaterally.   No wheezes, crackles, or rhonchi.  Cardiovascular: Normal S1, S2. No MRG. Regular rate and rhythm. No peripheral edema, cyanosis or pallor.  Gastrointestinal:  Soft, nondistended, nontender. No rebound or guarding.  Normal bowel sounds. No appreciable masses or hepatomegaly. Rectal:  Not performed.  Psychiatric: Demonstrates good judgement and reason without abnormal affect or behaviors.  RELEVANT LABS AND IMAGING: CBC    Component Value Date/Time   WBC 8.6 08/17/2019 1001   RBC 4.23 08/17/2019 1001   HGB 12.7 08/17/2019 1001   HCT 39.4 08/17/2019 1001  PLT 267 08/17/2019 1001   MCV 93.1 08/17/2019 1001   MCH 30.0 08/17/2019 1001   MCHC 32.2 08/17/2019 1001   RDW 14.6 08/17/2019 1001   LYMPHSABS 2.7 07/12/2019 1426   MONOABS 0.7 07/12/2019 1426   EOSABS 0.2 07/12/2019 1426   BASOSABS 0.1 07/12/2019 1426    CMP     Component Value Date/Time   NA 136 08/17/2019 1001   K 3.7 08/17/2019 1001   CL 104 08/17/2019 1001   CO2 23 08/17/2019 1001   GLUCOSE 163 (H) 08/17/2019 1001   BUN 17 08/17/2019 1001   CREATININE 0.83 08/17/2019 1001   CALCIUM 9.5 08/17/2019 1001   PROT 6.7 08/17/2019 1001   ALBUMIN 3.6 08/17/2019 1001   AST 20 08/17/2019 1001   ALT 19 08/17/2019 1001   ALKPHOS 31 (L) 08/17/2019 1001   BILITOT 0.7 08/17/2019 1001   GFRNONAA >60 08/17/2019 1001   GFRAA >60 08/17/2019 1001    Assessment: 1.  Chronic constipation: History of anal stricture last dilated/stretched in 2022, patient much improved with Linzess 145 mcg daily with no further overflow incontinence and vast improvement, the 72 mcg did not work well for her, now she is experiencing 2 bowel movements in the morning and the second 1 is more watery and urgent  Plan: 1.  Given that patient is not experiencing much straining at this point I do not think that she needs repeat flex sig with dilation, though this is still something that we could do in the future if this starts to bother her. 2.  For now we will try a different medication to see if it works better for her.  Started her on Movantik once daily.  Provided her with samples.  Instructed her to take for the next week and see if she feels like it is better than  the Linzess 145's.  If it is we can provide her with another bottle of samples and send in a prescription for her.  She was given a coupon card for Movantik. 3.  If above is not working then would recommend that she try taking Linzess 145 mcg 1 day and then alternate with Linzess 72 mcg every other day.  She was given samples of both of these doses to try this and see if it works.  If she remains on the Linzess she will need patient assistance.  She was given paperwork for this today. 4.  Patient will stay in touch and let me know how she is doing and we will follow-up in 2 months. 5.  Did discuss all the above with her daughter over the phone as well per patient request.  Ellouise Newer, PA-C [facility] 05/21/2022, 9:08 AM  Cc: Shon Baton, MD

## 2022-05-21 NOTE — Patient Instructions (Signed)
If you are age 86 or older, your body mass index should be between 23-30. Your Body mass index is 31.55 kg/m. If this is out of the aforementioned range listed, please consider follow up with your Primary Care Provider. ________________________________________________________  The [facility] providers would like to encourage you to use [facility] to communicate with providers for non-urgent requests or questions.  Due to long hold times on the telephone, sending your provider a message by [facility] may be a faster and more efficient way to get a response.  Please allow 48 business hours for a response.  Please remember that this is for non-urgent requests.  _______________________________________________________  Patient assistance paperwork given at today's appointment.  Hold Linzess for now.  Try samples of Motegrity '2mg'$  daily and let us know how it works. If medication works, we will send in prescription.  If Motegrity doesn't work, try Linzess 39mg one day then Linzess 145 mcg the next day.  Continue alternating between 710m and 145 mcg.  You will need a follow up in 2 months. We will contact you to schedule this appointment.  Thank you for entrusting me with your care and choosing [facility] JaArdis Hughs

## 2022-05-27 ENCOUNTER — Telehealth: Payer: Self-pay | Admitting: Physician Assistant

## 2022-05-27 MED ORDER — MOTEGRITY 2 MG PO TABS: 2.0000 mg | ORAL_TABLET | Freq: Every day | ORAL | 3 refills | Status: DC

## 2022-05-27 NOTE — Telephone Encounter (Signed)
Inbound call from patient stating that she seen Anderson Malta last week and she was given samples of Motegrity. Patient Is wanting a prescription sent to [facility]. Patient is requesting a call back to let her know that it has been sent to pharmacy. Please advise.

## 2022-05-27 NOTE — Telephone Encounter (Signed)
Patient was given samples of Motegrity '2mg'$  at appointment last week. Left message on voicemail to inform patient that Motegrity was sent to [facility] at [facility] and [address].  Advised patient to return call if she had any further issues.

## 2022-06-01 NOTE — Progress Notes (Signed)
Addendum: Reviewed and agree with assessment and management plan. Edris Friedt M, MD  

## 2022-06-10 DIAGNOSIS — M170 Bilateral primary osteoarthritis of knee: Secondary | ICD-10-CM | POA: Diagnosis not present

## 2022-06-10 DIAGNOSIS — M1711 Unilateral primary osteoarthritis, right knee: Secondary | ICD-10-CM | POA: Diagnosis not present

## 2022-06-10 DIAGNOSIS — M1712 Unilateral primary osteoarthritis, left knee: Secondary | ICD-10-CM | POA: Diagnosis not present

## 2022-07-11 ENCOUNTER — Ambulatory Visit: Payer: PPO | Admitting: Physician Assistant

## 2022-07-11 ENCOUNTER — Encounter: Payer: Self-pay | Admitting: Physician Assistant

## 2022-07-11 VITALS — BP 110/64 | HR 68 | Ht 59.0 in | Wt 162.5 lb

## 2022-07-11 DIAGNOSIS — K5909 Other constipation: Secondary | ICD-10-CM | POA: Diagnosis not present

## 2022-07-11 NOTE — Patient Instructions (Signed)
Continue Motegrity daily.  Please call if anything changes.

## 2022-07-11 NOTE — Progress Notes (Signed)
Chief Complaint: Chronic constipation  HPI:    Kimberly Mcgee is a 88 year old Caucasian female with past medical history as listed below, known to Dr. Hilarie Fredrickson, who returns to clinic today for follow-up of her chronic constipation.   06/29/2019 flex sig with anal stricture found on digital rectal exam with manual dilation done during DRE.      09/27/2019 office visit for constipation.  At that time was following up after being seen on 9/9 and starting Amitiza 8 mcg twice a day and MiraLAX once a day.  At that visit described that she was 99% better when taking this medication with MiraLAX.  She was continued on Amitiza 8 mcg twice a day and MiraLAX once daily.    04/08/2022 patient received a refill of Amitiza, but had called and told our office that this medicine was not working for her any longer.    04/16/2022 patient described that she was changed to generic Amitiza and it was still very expensive and not working like it used to.  She describes straining for solid bowel movements and some fecal incontinence.  She was taking MiraLAX.  At that time patient had a 2 view abdominal x-ray.  Provided her with samples of Linzess 72 mcg daily to see if this helps.  Did discuss possibly repeating flex sig with dilation of stricture if needed.    04/17/2022 abdominal x-ray with only small stool burden and moderate hepatomegaly.  That time she was instructed to start the Linzess 72 mcg daily.    05/21/2022 patient described that the Linzess 145 daily worked fairly well for her but she occasionally would have trouble and have watery loose stools.  At that time discussed her history of an anal stricture which had to be dilated/stretched in 2022, we tried her on Movantik instead.  That did not work well for her and we instead changed to [employer] daily.    Today, patient tells me that everything is good at the moment.  As long as she takes her Motegrity in the morning she seems to have 1-2 bowel movements which are fully  formed and easier to pass with no fecal incontinence in between.  She is happy with the results.    Denies fever, chills, weight loss or blood in her stool.  Past Medical History:  Diagnosis Date   Arthritis    right knee   Bladder prolapse, female, acquired    Gallstones    Hypertension     Past Surgical History:  Procedure Laterality Date   ABDOMINAL HYSTERECTOMY     CHOLECYSTECTOMY N/A 08/23/2019   Procedure: LAPAROSCOPIC CHOLECYSTECTOMY;  Surgeon: Rolm Bookbinder, MD;  Location: [facility];  Service: General;  Laterality: N/A;  GENERAL WITH TAP BLOCK BILATERALLY   HEMORROIDECTOMY     x 2   KNEE SURGERY      Current Outpatient Medications  Medication Sig Dispense Refill   aspirin EC 81 MG tablet Take 81 mg by mouth every other day.     Calcium Carbonate-Vitamin D (CALCIUM 600 + D PO) Take 1 tablet by mouth 2 (two) times daily.       citalopram (CELEXA) 20 MG tablet Take 20 mg by mouth daily after breakfast.      losartan-hydrochlorothiazide (HYZAAR) 100-25 MG per tablet Take 1 tablet by mouth daily after breakfast.      Melatonin 5 MG TABS Take 5 mg by mouth at bedtime as needed (sleep).     Misc Natural Products (COSAMIN ASU ADVANCED FORMULA  PO) Take 1 tablet by mouth 2 (two) times daily.     Multiple Vitamins-Minerals (ONE-A-DAY WOMENS 50 PLUS PO) Take 1 tablet by mouth daily.       naproxen sodium (ALEVE) 220 MG tablet Take 440 mg by mouth as needed (pain).     Omega-3 Fatty Acids (FISH OIL PO) Take 1 capsule by mouth 2 (two) times daily.      Prucalopride Succinate (MOTEGRITY) 2 MG TABS Take 1 tablet (2 mg total) by mouth daily. 30 tablet 3   polyethylene glycol powder (GLYCOLAX/MIRALAX) 17 GM/SCOOP powder Take 17 g by mouth daily as needed. (Patient not taking: Reported on 07/11/2022)     No current facility-administered medications for this visit.    Allergies as of 07/11/2022   (No Known Allergies)    Family History  Problem Relation Age of Onset   Uterine cancer  Mother    Liver disease Neg Hx    Colon cancer Neg Hx    Pancreatic cancer Neg Hx    Stomach cancer Neg Hx    Rectal cancer Neg Hx    Esophageal cancer Neg Hx     Social History   Socioeconomic History   Marital status: Widowed    Spouse name: Not on file   Number of children: 3   Years of education: Not on file   Highest education level: Not on file  Occupational History   Not on file  Tobacco Use   Smoking status: Never   Smokeless tobacco: Never  Vaping Use   Vaping Use: Not on file  Substance and Sexual Activity   Alcohol use: No   Drug use: No   Sexual activity: Never  Other Topics Concern   Not on file  Social History Narrative   Not on file   Social Determinants of Health   Financial Resource Strain: Not on file  Food Insecurity: Not on file  Transportation Needs: Not on file  Physical Activity: Not on file  Stress: Not on file  Social Connections: Not on file  Intimate Partner Violence: Not on file    Review of Systems:    Constitutional: No weight loss, fever or chills Cardiovascular: No chest pain   Respiratory: No SOB  Gastrointestinal: See HPI and otherwise negative   Physical Exam:  Vital signs: BP 110/64 (BP Location: Left Arm, Patient Position: Sitting, Cuff Size: Normal)   Pulse 68   Ht '4\' 11"'$  (1.499 m) Comment: height measured without shoes  Wt 162 lb 8 oz (73.7 kg)   BMI 32.82 kg/m    Constitutional:   Pleasant Elderly Caucasian female appears to be in NAD, Well developed, Well nourished, alert and cooperative Respiratory: Respirations even and unlabored. Lungs clear to auscultation bilaterally.   No wheezes, crackles, or rhonchi.  Cardiovascular: Normal S1, S2. No MRG. Regular rate and rhythm. No peripheral edema, cyanosis or pallor.  Gastrointestinal:  Soft, nondistended, nontender. No rebound or guarding. Normal bowel sounds. No appreciable masses or hepatomegaly. Rectal:  Not performed.  Psychiatric: Oriented to person, place and  time. Demonstrates good judgement and reason without abnormal affect or behaviors.  No recent labs or imaging.  Assessment: 1.  Chronic constipation: History of anal stricture last dilated in 2022, symptoms are fully improved with Motegrity daily (previously tried Linzess, MiraLAX, Movantik and none of these worked for her)  Plan: 1.  Continue Motegrity daily.  This seems reasonably priced for her and works well. 2.  Patient to follow in clinic with [place]  as needed.  Ellouise Newer, PA-C [facility] 07/11/2022, 1:50 PM  Cc: Shon Baton, MD

## 2022-07-14 NOTE — Progress Notes (Signed)
Addendum: Reviewed and agree with assessment and management plan. Nashya Garlington M, MD  

## 2022-08-29 ENCOUNTER — Telehealth: Payer: Self-pay | Admitting: Physician Assistant

## 2022-08-29 MED ORDER — MOTEGRITY 2 MG PO TABS: 2.0000 mg | ORAL_TABLET | Freq: Every day | ORAL | 3 refills | Status: DC

## 2022-08-29 NOTE — Telephone Encounter (Signed)
Rx for Motegrity sent to the pharmacy for 90 days supply as requested.

## 2022-08-29 NOTE — Telephone Encounter (Signed)
Inbound call from patient pharmacy stating patient needs prescription for a 90 day supply of Montegrity. Please give a call back to further advise.  Thank you

## 2022-09-02 ENCOUNTER — Other Ambulatory Visit: Payer: Self-pay | Admitting: Physician Assistant

## 2022-09-08 DIAGNOSIS — M170 Bilateral primary osteoarthritis of knee: Secondary | ICD-10-CM | POA: Diagnosis not present

## 2022-09-09 DIAGNOSIS — I129 Hypertensive chronic kidney disease with stage 1 through stage 4 chronic kidney disease, or unspecified chronic kidney disease: Secondary | ICD-10-CM | POA: Diagnosis not present

## 2022-09-09 DIAGNOSIS — E669 Obesity, unspecified: Secondary | ICD-10-CM | POA: Diagnosis not present

## 2022-09-09 DIAGNOSIS — E781 Pure hyperglyceridemia: Secondary | ICD-10-CM | POA: Diagnosis not present

## 2022-09-09 DIAGNOSIS — M8580 Other specified disorders of bone density and structure, unspecified site: Secondary | ICD-10-CM | POA: Diagnosis not present

## 2022-09-09 DIAGNOSIS — N1831 Chronic kidney disease, stage 3a: Secondary | ICD-10-CM | POA: Diagnosis not present

## 2022-09-09 DIAGNOSIS — E1122 Type 2 diabetes mellitus with diabetic chronic kidney disease: Secondary | ICD-10-CM | POA: Diagnosis not present

## 2022-09-09 DIAGNOSIS — M179 Osteoarthritis of knee, unspecified: Secondary | ICD-10-CM | POA: Diagnosis not present

## 2022-09-09 DIAGNOSIS — I700 Atherosclerosis of aorta: Secondary | ICD-10-CM | POA: Diagnosis not present

## 2022-09-09 DIAGNOSIS — R54 Age-related physical debility: Secondary | ICD-10-CM | POA: Diagnosis not present

## 2022-09-09 DIAGNOSIS — R413 Other amnesia: Secondary | ICD-10-CM | POA: Diagnosis not present

## 2022-09-09 DIAGNOSIS — R7989 Other specified abnormal findings of blood chemistry: Secondary | ICD-10-CM | POA: Diagnosis not present

## 2022-09-23 NOTE — Telephone Encounter (Signed)
Returned call to patient. She states that she thinks that Movantik is making her hair thin and fall out. Pt states that it started not too long after starting Movantik. Pt states that she did not notice at that time, but the hair loss seems to be significant now. Pt is not sure if this is a side effect of Movantik or if it could just be her age. Please advise, thanks.

## 2022-09-23 NOTE — Telephone Encounter (Signed)
[facility] called requested to speak with a nurse regarding the Motegrity medication she thinks its making her hair fall off.

## 2022-09-24 NOTE — Telephone Encounter (Signed)
Spoke with pt and she is aware of Jennifer's recommendations and will contact her PCP.

## 2022-10-13 ENCOUNTER — Emergency Department (HOSPITAL_COMMUNITY)
Admission: EM | Admit: 2022-10-13 | Discharge: 2022-10-13 | Disposition: A | Discharge status: Home / Self Care | Payer: PPO | Attending: Emergency Medicine | Admitting: Emergency Medicine

## 2022-10-13 ENCOUNTER — Emergency Department (HOSPITAL_COMMUNITY): Payer: PPO

## 2022-10-13 ENCOUNTER — Other Ambulatory Visit: Payer: Self-pay

## 2022-10-13 ENCOUNTER — Encounter (HOSPITAL_COMMUNITY): Payer: Self-pay

## 2022-10-13 DIAGNOSIS — W010XXA Fall on same level from slipping, tripping and stumbling without subsequent striking against object, initial encounter: Secondary | ICD-10-CM | POA: Diagnosis not present

## 2022-10-13 DIAGNOSIS — Z79899 Other long term (current) drug therapy: Secondary | ICD-10-CM | POA: Diagnosis not present

## 2022-10-13 DIAGNOSIS — Z043 Encounter for examination and observation following other accident: Secondary | ICD-10-CM | POA: Diagnosis not present

## 2022-10-13 DIAGNOSIS — S0990XA Unspecified injury of head, initial encounter: Secondary | ICD-10-CM | POA: Diagnosis not present

## 2022-10-13 DIAGNOSIS — S0083XA Contusion of other part of head, initial encounter: Secondary | ICD-10-CM | POA: Diagnosis not present

## 2022-10-13 DIAGNOSIS — I10 Essential (primary) hypertension: Secondary | ICD-10-CM | POA: Diagnosis not present

## 2022-10-13 DIAGNOSIS — Z7982 Long term (current) use of aspirin: Secondary | ICD-10-CM | POA: Insufficient documentation

## 2022-10-13 DIAGNOSIS — M47812 Spondylosis without myelopathy or radiculopathy, cervical region: Secondary | ICD-10-CM | POA: Diagnosis not present

## 2022-10-13 DIAGNOSIS — S0993XA Unspecified injury of face, initial encounter: Secondary | ICD-10-CM | POA: Insufficient documentation

## 2022-10-13 DIAGNOSIS — W19XXXA Unspecified fall, initial encounter: Secondary | ICD-10-CM

## 2022-10-13 MED ORDER — TETANUS-DIPHTH-ACELL PERTUSSIS 5-2.5-18.5 LF-MCG/0.5 IM SUSY: 0.5000 mL | PREFILLED_SYRINGE | Freq: Once | INTRAMUSCULAR | Status: DC

## 2022-10-13 NOTE — ED Provider Triage Note (Signed)
Emergency Medicine Provider Triage Evaluation Note  Kimberly Mcgee , a 88 y.o. female  was evaluated in triage.  Pt complains of injuries from a fall, tripped while walking in an entry way, fell, hitting face on the carpet, not on thinners. No LOC. Denies pain in arms, legs, neck/back. Reports nose bleed and scrapes to face.  Last td on file 2012 Review of Systems  Positive: As above Negative: As above  Physical Exam  BP (!) 162/85   Pulse 77   Temp 97.7 F (36.5 C) (Oral)   Resp 18   SpO2 95%  Gen:   Awake, no distress   Resp:  Normal effort  MSK:   Moves extremities without difficulty  Other:    Medical Decision Making  Medically screening exam initiated at 4:00 PM.  Appropriate orders placed.  Kimberly Mcgee was informed that the remainder of the evaluation will be completed by another provider, this initial triage assessment does not replace that evaluation, and the importance of remaining in the ED until their evaluation is complete.     Tacy Learn, PA-C 10/13/22 1604

## 2022-10-13 NOTE — ED Triage Notes (Signed)
Pt c/o fall today, tripping coming through the front door. Pt states she hit her head, bruising to her face. Pt denies pain, denies taking blood thinners, pt denies LOC.

## 2022-10-13 NOTE — ED Provider Notes (Signed)
[facility] DEPT Provider Note   CSN: [id number] Arrival date & time: 10/13/22  1441     History  Chief Complaint  Patient presents with   Fall   Facial Injury    Kimberly Mcgee is a 88 y.o. female.   Fall  Facial Injury   88 year old female presents emergency department after a fall.  Patient states that she tripped across the threshold hitting her face on the carpet.  She denies direct loss of consciousness or current blood thinner use.  She states that she was helped to her feet and has been at baseline since.  She reports some bleeding from the nose as well as from scratches noticed on her face.  Denies visual deficits, weakness of sensory deficits, slurred speech, facial droop, feelings of unbalanced, dizziness/lightheadedness.  Denies shortness of breath, chest pain, back pain, abdominal pain, nausea, vomiting, lower/upper extremity pain.  Denies any presyncopal symptoms.  Home Medications Prior to Admission medications   Medication Sig Start Date End Date Taking? Authorizing Provider  aspirin EC 81 MG tablet Take 81 mg by mouth every other day.    [provider]  Calcium Carbonate-Vitamin D (CALCIUM 600 + D PO) Take 1 tablet by mouth 2 (two) times daily.      [provider]  citalopram (CELEXA) 20 MG tablet Take 20 mg by mouth daily after breakfast.     [provider]  losartan-hydrochlorothiazide (HYZAAR) 100-25 MG per tablet Take 1 tablet by mouth daily after breakfast.  09/12/13   [provider]  Melatonin 5 MG TABS Take 5 mg by mouth at bedtime as needed (sleep).    [provider]  Misc Natural Products (COSAMIN ASU ADVANCED FORMULA PO) Take 1 tablet by mouth 2 (two) times daily.    [provider]  MOTEGRITY 2 MG TABS TAKE 1 TABLET(2 MG) BY MOUTH DAILY 09/02/22   Levin Erp, PA  Multiple Vitamins-Minerals (ONE-A-DAY WOMENS 50 PLUS PO) Take 1 tablet by mouth daily.       [provider]  naproxen sodium (ALEVE) 220 MG tablet Take 440 mg by mouth as needed (pain).    [provider]  Omega-3 Fatty Acids (FISH OIL PO) Take 1 capsule by mouth 2 (two) times daily.     [provider]  polyethylene glycol powder (GLYCOLAX/MIRALAX) 17 GM/SCOOP powder Take 17 g by mouth daily as needed. Patient not taking: Reported on 07/11/2022    [provider]      Allergies    Patient has no known allergies.    Review of Systems   Review of Systems  All other systems reviewed and are negative.   Physical Exam Updated Vital Signs BP (!) 162/85   Pulse 77   Temp 97.7 F (36.5 C) (Oral)   Resp 18   SpO2 95%  Physical Exam Vitals and nursing note reviewed.  Constitutional:      General: She is not in acute distress.    Appearance: She is well-developed.  HENT:     Head: Normocephalic.     Comments: Dried blood noted at the end of each nare.  Small abrasions noted on on patient's forehead with no active bleeding noted.  No obvious laceration noted.    Right Ear: Tympanic membrane normal.     Left Ear: Tympanic membrane normal.     Nose:     Comments: No obvious septal hematoma noted.  Dried blood noted at distal aspect of bilateral  nare. Eyes:     Extraocular Movements: Extraocular movements intact.     Conjunctiva/sclera: Conjunctivae normal.     Pupils: Pupils are equal, round, and reactive to light.  Cardiovascular:     Rate and Rhythm: Normal rate and regular rhythm.     Heart sounds: No murmur heard. Pulmonary:     Effort: Pulmonary effort is normal. No respiratory distress.     Breath sounds: Normal breath sounds.  Abdominal:     Palpations: Abdomen is soft.     Tenderness: There is no abdominal tenderness.  Musculoskeletal:        General: No swelling.     Cervical back: Neck supple.     Right lower leg: No edema.     Left lower leg: No edema.     Comments: No tenderness to palpation of midline cervical,  thoracic, lumbar spine with no obvious step-off or deformity noted.  No tenderness palpation of anterior posterior chest wall.  No tenderness to palpation along upper or lower extremities.  Patient moves all 4 extremities without difficulty.  Motor strength out of 5 upper lower extremities.    Skin:    General: Skin is warm and dry.     Capillary Refill: Capillary refill takes less than 2 seconds.  Neurological:     Mental Status: She is alert.     Comments: Alert and oriented to self, place, time and event.   Speech is fluent, clear without dysarthria or dysphasia.   Strength 5/5 in upper/lower extremities   Sensation intact in upper/lower extremities   Negative Romberg. No pronator drift.  Normal finger-to-nose and feet tapping.  CN I not tested  CN II grossly intact visual fields bilaterally. Did not visualize posterior eye.  CN III, IV, VI PERRLA and EOMs intact bilaterally  CN V Intact sensation to sharp and light touch to the face  CN VII facial movements symmetric  CN VIII not tested  CN IX, X no uvula deviation, symmetric rise of soft palate  CN XI 5/5 SCM and trapezius strength bilaterally  CN XII Midline tongue protrusion, symmetric L/R movements   Psychiatric:        Mood and Affect: Mood normal.     ED Results / Procedures / Treatments   Labs (all labs ordered are listed, but only abnormal results are displayed) Labs Reviewed - No data to display  EKG None  Radiology CT Head Wo Contrast  Result Date: 10/13/2022 CLINICAL DATA:  Fall, head injury, facial bruising EXAM: CT HEAD WITHOUT CONTRAST CT MAXILLOFACIAL WITHOUT CONTRAST CT CERVICAL SPINE WITHOUT CONTRAST TECHNIQUE: Multidetector CT imaging of the head, cervical spine, and maxillofacial structures were performed using the standard protocol without intravenous contrast. Multiplanar CT image reconstructions of the cervical spine and maxillofacial structures were also generated. RADIATION DOSE REDUCTION: This  exam was performed according to the departmental dose-optimization program which includes automated exposure control, adjustment of the mA and/or kV according to patient size and/or use of iterative reconstruction technique. COMPARISON:  CT head dated 06/08/2017 FINDINGS: CT HEAD FINDINGS Brain: No evidence of acute infarction, hemorrhage, hydrocephalus, extra-axial collection or mass lesion/mass effect. Subcortical white matter and periventricular small vessel ischemic changes. Vascular: No hyperdense vessel or unexpected calcification. Skull: Normal. Negative for fracture or focal lesion. Other: None. CT MAXILLOFACIAL FINDINGS Osseous: No evidence of maxillofacial fracture. Nasal bones are intact. Mandible is intact. Bilateral mandibular condyles are well-seated in the TMJs. Orbits: Bilateral orbits, including the globes and retroconal soft tissues, are within  normal limits. Sinuses: The visualized paranasal sinuses are essentially clear. The mastoid air cells are unopacified. Soft tissues: Mild soft tissue swelling overlying the right frontal bone (series 3/image 24). CT CERVICAL SPINE FINDINGS Alignment: Normal cervical lordosis. Skull base and vertebrae: No acute fracture. No primary bone lesion or focal pathologic process. Soft tissues and spinal canal: No prevertebral fluid or swelling. No visible canal hematoma. Disc levels: Mild degenerative changes of the mid cervical spine. Spinal canal is patent. Upper chest: Visualized lung apices are clear. Other: Visualized thyroid is unremarkable. IMPRESSION: No evidence of acute intracranial abnormality. Small vessel ischemic changes. Mild soft tissue swelling overlying the right frontal bone. No evidence of maxillofacial fracture. No traumatic injury to the cervical spine. Mild degenerative changes. Electronically Signed   By: Julian Hy M.D.   On: 10/13/2022 18:40   CT Cervical Spine Wo Contrast  Result Date: 10/13/2022 CLINICAL DATA:  Fall, head  injury, facial bruising EXAM: CT HEAD WITHOUT CONTRAST CT MAXILLOFACIAL WITHOUT CONTRAST CT CERVICAL SPINE WITHOUT CONTRAST TECHNIQUE: Multidetector CT imaging of the head, cervical spine, and maxillofacial structures were performed using the standard protocol without intravenous contrast. Multiplanar CT image reconstructions of the cervical spine and maxillofacial structures were also generated. RADIATION DOSE REDUCTION: This exam was performed according to the departmental dose-optimization program which includes automated exposure control, adjustment of the mA and/or kV according to patient size and/or use of iterative reconstruction technique. COMPARISON:  CT head dated 06/08/2017 FINDINGS: CT HEAD FINDINGS Brain: No evidence of acute infarction, hemorrhage, hydrocephalus, extra-axial collection or mass lesion/mass effect. Subcortical white matter and periventricular small vessel ischemic changes. Vascular: No hyperdense vessel or unexpected calcification. Skull: Normal. Negative for fracture or focal lesion. Other: None. CT MAXILLOFACIAL FINDINGS Osseous: No evidence of maxillofacial fracture. Nasal bones are intact. Mandible is intact. Bilateral mandibular condyles are well-seated in the TMJs. Orbits: Bilateral orbits, including the globes and retroconal soft tissues, are within normal limits. Sinuses: The visualized paranasal sinuses are essentially clear. The mastoid air cells are unopacified. Soft tissues: Mild soft tissue swelling overlying the right frontal bone (series 3/image 24). CT CERVICAL SPINE FINDINGS Alignment: Normal cervical lordosis. Skull base and vertebrae: No acute fracture. No primary bone lesion or focal pathologic process. Soft tissues and spinal canal: No prevertebral fluid or swelling. No visible canal hematoma. Disc levels: Mild degenerative changes of the mid cervical spine. Spinal canal is patent. Upper chest: Visualized lung apices are clear. Other: Visualized thyroid is  unremarkable. IMPRESSION: No evidence of acute intracranial abnormality. Small vessel ischemic changes. Mild soft tissue swelling overlying the right frontal bone. No evidence of maxillofacial fracture. No traumatic injury to the cervical spine. Mild degenerative changes. Electronically Signed   By: Julian Hy M.D.   On: 10/13/2022 18:40   CT Maxillofacial WO CM  Result Date: 10/13/2022 CLINICAL DATA:  Fall, head injury, facial bruising EXAM: CT HEAD WITHOUT CONTRAST CT MAXILLOFACIAL WITHOUT CONTRAST CT CERVICAL SPINE WITHOUT CONTRAST TECHNIQUE: Multidetector CT imaging of the head, cervical spine, and maxillofacial structures were performed using the standard protocol without intravenous contrast. Multiplanar CT image reconstructions of the cervical spine and maxillofacial structures were also generated. RADIATION DOSE REDUCTION: This exam was performed according to the departmental dose-optimization program which includes automated exposure control, adjustment of the mA and/or kV according to patient size and/or use of iterative reconstruction technique. COMPARISON:  CT head dated 06/08/2017 FINDINGS: CT HEAD FINDINGS Brain: No evidence of acute infarction, hemorrhage, hydrocephalus, extra-axial collection or mass lesion/mass effect. Subcortical  white matter and periventricular small vessel ischemic changes. Vascular: No hyperdense vessel or unexpected calcification. Skull: Normal. Negative for fracture or focal lesion. Other: None. CT MAXILLOFACIAL FINDINGS Osseous: No evidence of maxillofacial fracture. Nasal bones are intact. Mandible is intact. Bilateral mandibular condyles are well-seated in the TMJs. Orbits: Bilateral orbits, including the globes and retroconal soft tissues, are within normal limits. Sinuses: The visualized paranasal sinuses are essentially clear. The mastoid air cells are unopacified. Soft tissues: Mild soft tissue swelling overlying the right frontal bone (series 3/image 24).  CT CERVICAL SPINE FINDINGS Alignment: Normal cervical lordosis. Skull base and vertebrae: No acute fracture. No primary bone lesion or focal pathologic process. Soft tissues and spinal canal: No prevertebral fluid or swelling. No visible canal hematoma. Disc levels: Mild degenerative changes of the mid cervical spine. Spinal canal is patent. Upper chest: Visualized lung apices are clear. Other: Visualized thyroid is unremarkable. IMPRESSION: No evidence of acute intracranial abnormality. Small vessel ischemic changes. Mild soft tissue swelling overlying the right frontal bone. No evidence of maxillofacial fracture. No traumatic injury to the cervical spine. Mild degenerative changes. Electronically Signed   By: Julian Hy M.D.   On: 10/13/2022 18:40    Procedures Procedures    Medications Ordered in ED Medications  Tdap (BOOSTRIX) injection 0.5 mL (has no administration in time range)    ED Course/ Medical Decision Making/ A&P                           Medical Decision Making  This patient presents to the ED for concern of fall, this involves an extensive number of treatment options, and is a complaint that carries with it a high risk of complications and morbidity.  The differential diagnosis includes fracture, CVA, strain/sprain, dislocation, spinal cord damage   Co morbidities that complicate the patient evaluation  See HPI   Additional history obtained:  Additional history obtained from EMR External records from outside source obtained and reviewed including hospital records   Lab Tests:  N/a   Imaging Studies ordered:  I ordered imaging studies including CT head, C-spine, maxillofacial I independently visualized and interpreted imaging which showed no acute abnormalities.  Small ischemic vessel changes.  Mild soft tissue swelling over the right frontal bone.  No evidence of maxillofacial fracture.  No traumatic injury to the C-spine with mild degenerative changes. I  agree with the radiologist interpretation  Cardiac Monitoring: / EKG:  The patient was maintained on a cardiac monitor.  I personally viewed and interpreted the cardiac monitored which showed an underlying rhythm of: Sinus rhythm   Consultations Obtained:  N/a   Problem List / ED Course / Critical interventions / Medication management  Fall I ordered medication including Tdap for tetanus update   Reevaluation of the patient after these medicines showed that the patient improved I have reviewed the patients home medicines and have made adjustments as needed   Social Determinants of Health:  Denies tobacco, illicit drug use   Test / Admission - Considered:  Fall Vitals signs significant for hypertension with blood pressure 162/85.  Recommend close follow-up with PCP regarding elevation of blood pressure.. Otherwise within normal range and stable throughout visit. Imaging studies significant for: See above Imaging studies negative for any acute abnormalities.  Fall described as mechanical.  Patient ambulated in emergency department without any difficulty.  Patient neurologically intact.  Further work-up deemed necessary in the emergency department.  Symptomatic therapy recommended at home  with Tylenol/ibuprofen as needed for pain.  Treatment plan discussed with patient she knowledge understanding was agreeable to said plan.  Close follow-up with PCP recommended for reevaluation in 3 to 5 days. Worrisome signs and symptoms were discussed with the patient, and the patient acknowledged understanding to return to the ED if noticed. Patient was stable upon discharge.          Final Clinical Impression(s) / ED Diagnoses Final diagnoses:  Fall, initial encounter    Rx / DC Orders ED Discharge Orders     None         Wilnette Kales, [place] 10/13/22 [place], [place], DO 10/13/22 1924

## 2022-10-13 NOTE — ED Notes (Signed)
Pt requested that we clean her face so she can return home. Pt face was cleaned. Pt verbalized understanding of discharge instructions. Pt ambulated from ed with steady gait. Son to drive home.

## 2022-10-13 NOTE — Discharge Instructions (Addendum)
Note the work-up to the emergency department today was overall reassuring.  Symptomatic therapy at home recommended with I Profen/Tylenol as needed for pain.  Recommend reevaluation in 3 to 5 days by primary care provider.  Please not hesitate to return to emergency department for worrisome signs and symptoms we discussed become apparent.

## 2022-12-04 DIAGNOSIS — M170 Bilateral primary osteoarthritis of knee: Secondary | ICD-10-CM | POA: Diagnosis not present

## 2023-01-22 ENCOUNTER — Telehealth: Payer: Self-pay | Admitting: Physician Assistant

## 2023-01-22 DIAGNOSIS — M170 Bilateral primary osteoarthritis of knee: Secondary | ICD-10-CM | POA: Diagnosis not present

## 2023-01-22 NOTE — Telephone Encounter (Signed)
Returned call to patient. She states that she was doing so well with her new diet and not having issues with constipation. Lately she has been feeling like she is not evacuating completely. Pt states that she thought she had a blockage. She is passing small amounts of stool daily. Pt confirmed that she is still taking Motegrity 2 mg. Pt has Magnesium Citrate at home and would like to try this. I told pt that she can drink half of the magnesium citrate bottle now, wait a couple of hours for a response. If no response patient has been advised to drink the other half of magnesium citrate later this evening. Pt is aware that I will call her tomorrow to follow up and see how she is feeling.

## 2023-01-22 NOTE — Telephone Encounter (Signed)
Patient is calling states she is feeling constipate and is wondering if it is okay for her to take magnesium citrate. Please advise.

## 2023-01-22 NOTE — Telephone Encounter (Signed)
Called and spoke with Anderson Malta regarding pt's concerns. Anderson Malta recommended that patient try an enema  to move any hard stool from the lower colon or suppository. 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 She states that she had knee injections today also. Pt does have Miralax at home and will try a dose now mixed in water. Pt states that the discomfort is not unbearable and she has not had any vomiting. Pt will try Miralax tonight and monitor symptoms. Pt will go to ER if symptoms worsen or if she develops vomiting. Pt has been advised that I will follow up with her tomorrow.

## 2023-01-22 NOTE — Telephone Encounter (Signed)
Pt called again. She states that she drank 1/2 the bottle of magnesium citrate at 1 pm then an hour later she completed the bottle. Pt reports only passing a small amount of stool. Pt reports that she gets the urgency to have a BM but nothing is coming out. Pt is concerned that she has a blockage and may need to go to the ED. Pt states that is drinking plenty of fluids. I told pt that she may need to give it a couple of more hours. Please advise, thanks.

## 2023-01-23 NOTE — Telephone Encounter (Signed)
Patient states that she is much better today. She states that she had several bowel movements last night and feels like she has emptied completely. We scheduled patient for a follow up with you on Friday, 02/13/23 at 10 am. Pt knows to expect her appt information and diet in the mail. Pt was thankful for the recommendations yesterday and the follow up call today. Pt verbalized understanding and had no concerns at the end of the call.

## 2023-01-29 DIAGNOSIS — M170 Bilateral primary osteoarthritis of knee: Secondary | ICD-10-CM | POA: Diagnosis not present

## 2023-02-01 ENCOUNTER — Telehealth: Payer: Self-pay | Admitting: Physician Assistant

## 2023-02-01 NOTE — Telephone Encounter (Signed)
Patient called this afternoon having difficulty with constipation.  She states she had a similar episode about a week ago and had to take a bottle of mag citrate in addition to her Motegrity once daily.  The mag citrate did work at that point  Last bowel movement was 2 days ago passed a small amount of stool.  She says she is bloated and uncomfortable.  She took a bottle of mag citrate and a divided dose earlier this morning and also took a dose of MiraLAX she still has not had a bowel movement  Advised to start a MiraLAX purge 17 g of MiraLAX in 16 ounces of water every 20 to 30 minutes for 6-7 doses.  I also advised her that moving forward since the Motegrity has not been working as well over the past couple of weeks that she can take Motegrity 2 mg daily and in addition take a dose of MiraLAX each day  Is an appointment with Anderson Malta in mid February is asking for a sooner appointment with anybody.  Please see if anybody has any availability sooner and change appointment if possible  She will call back tomorrow if she has not had any success with the bowel purge

## 2023-02-02 NOTE — Telephone Encounter (Signed)
Spoke with pt and she is aware of the appt.

## 2023-02-02 NOTE — Telephone Encounter (Signed)
Scheduled pt to see Alonza Bogus PA 02/04/23 at 2:30pm. Attempted to call pt and continue to get busy signal.

## 2023-02-04 ENCOUNTER — Encounter: Payer: Self-pay | Admitting: Gastroenterology

## 2023-02-04 ENCOUNTER — Ambulatory Visit (INDEPENDENT_AMBULATORY_CARE_PROVIDER_SITE_OTHER): Payer: HMO | Admitting: Gastroenterology

## 2023-02-04 VITALS — BP 128/80 | HR 81 | Ht 62.0 in | Wt 164.0 lb

## 2023-02-04 DIAGNOSIS — K5909 Other constipation: Secondary | ICD-10-CM

## 2023-02-04 NOTE — Patient Instructions (Signed)
Continue Motegrity daily.  Add one capful of Miralax in 8 ounces of of fluid daily.  Call our office in a week with an update on your symptoms.   The [facility] providers would like to encourage you to use [facility] to communicate with providers for non-urgent requests or questions.  Due to long hold times on the telephone, sending your provider a message by [facility] may be a faster and more efficient way to get a response.  Please allow 48 business hours for a response.  Please remember that this is for non-urgent requests.

## 2023-02-04 NOTE — Progress Notes (Signed)
02/04/2023 BRENNYN ORTLIEB [id number] [birth year]   HISTORY OF PRESENT ILLNESS: This is a 89 year old female with limited past medical history.  She is a patient Dr. Vena Rua and follows here for issues with chronic constipation.   06/29/2019 flex sig with anal stricture found on digital rectal exam with manual dilation done during DRE.  Please see note from Ellouise Newer, PA-C, on 07/11/2022, which lays out her history over the past few years.  Nonetheless, she has tried Linzess, MiraLAX alone, Movantik, Amitiza over time and eventually they stopped working for her.  Now she is on Motegrity 2 mg daily and has been doing well with that.  When she was last seen here in July 2023 she was doing very well.  She says that she has done well up until just a couple of weeks ago.  2 weeks ago she had a bout with severe constipation, took some magnesium citrate and that helped relieve her symptoms.  The following week she also had another bout of severe constipation, took some magnesium citrate but that did not help on that occasion.  She was instructed to do a MiraLAX bowel purge over the weekend, she did that on Sunday and things seem to have straightened out.  She had a normal good bowel movement today.    I spoke with her daughter York Cerise, on the phone today as well.   Past Medical History:  Diagnosis Date   Arthritis    right knee   Bladder prolapse, female, acquired    Gallstones    Hypertension    Past Surgical History:  Procedure Laterality Date   ABDOMINAL HYSTERECTOMY     CHOLECYSTECTOMY N/A 08/23/2019   Procedure: LAPAROSCOPIC CHOLECYSTECTOMY;  Surgeon: Rolm Bookbinder, MD;  Location: [facility];  Service: General;  Laterality: N/A;  GENERAL WITH TAP BLOCK BILATERALLY   HEMORROIDECTOMY     x 2   KNEE SURGERY      reports that she has never smoked. She has never used smokeless tobacco. She reports that she does not drink alcohol and does not use drugs. family history includes Uterine cancer in  her mother. No Known Allergies    Outpatient Encounter Medications as of 02/04/2023  Medication Sig   aspirin EC 81 MG tablet Take 81 mg by mouth every other day.   Calcium Carbonate-Vitamin D (CALCIUM 600 + D PO) Take 1 tablet by mouth 2 (two) times daily.     citalopram (CELEXA) 20 MG tablet Take 20 mg by mouth daily after breakfast.    losartan-hydrochlorothiazide (HYZAAR) 100-25 MG per tablet Take 1 tablet by mouth daily after breakfast.    Melatonin 5 MG TABS Take 5 mg by mouth at bedtime as needed (sleep).   Misc Natural Products (COSAMIN ASU ADVANCED FORMULA PO) Take 1 tablet by mouth 2 (two) times daily.   MOTEGRITY 2 MG TABS TAKE 1 TABLET(2 MG) BY MOUTH DAILY   Multiple Vitamins-Minerals (ONE-A-DAY WOMENS 50 PLUS PO) Take 1 tablet by mouth daily.     naproxen sodium (ALEVE) 220 MG tablet Take 440 mg by mouth as needed (pain).   Omega-3 Fatty Acids (FISH OIL PO) Take 1 capsule by mouth 2 (two) times daily.    polyethylene glycol powder (GLYCOLAX/MIRALAX) 17 GM/SCOOP powder Take 17 g by mouth daily as needed. (Patient not taking: Reported on 07/11/2022)   No facility-administered encounter medications on file as of 02/04/2023.     REVIEW OF SYSTEMS  : All other systems reviewed and negative  except where noted in the History of Present Illness.   PHYSICAL EXAM: BP 128/80   Pulse 81   Ht '5\' 2"'$  (1.575 m)   Wt 164 lb (74.4 kg)   SpO2 98%   BMI 30.00 kg/m  General: Well developed white female in no acute distress Head: Normocephalic and atraumatic Eyes:  Sclerae anicteric, conjunctiva pink. Ears: Normal auditory acuity Lungs: Clear throughout to auscultation; no W/R/R. Heart: Regular rate and rhythm; no M/R/G. Abdomen: Soft, non-distended.  BS present.  Non-tender. Musculoskeletal: Symmetrical with no gross deformities  Skin: No lesions on visible extremities Extremities: No edema  Neurological: Alert oriented x 4, grossly non-focal Psychological:  Alert and cooperative.  Normal mood and affect  ASSESSMENT AND PLAN: *Chronic constipation: History of anal stricture last dilated in 2020, which is likely contributing to her issues as well.  Has done well for the most part with Motegrity and reported that symptoms were fully improved in July 2023.  Recently has had a couple of weeks where she has struggled with constipation, but got relief from magnesium citrate and MiraLAX bowel purge.  Motegrity has worked the best for her has been the most cost effective.  Other things she has tried:  Linzess, Amitiza, Movantik.  I think at this point we we will keep her on the [place] and have her add a dose of MiraLAX daily to her regimen as previously discussed.  I have asked her to update [place] in about a week to see how she is doing with that regimen.  I discussed with her daughter on the phone as well.   CC:  Shon Baton, MD

## 2023-02-05 DIAGNOSIS — M170 Bilateral primary osteoarthritis of knee: Secondary | ICD-10-CM | POA: Diagnosis not present

## 2023-02-05 NOTE — Progress Notes (Signed)
Addendum: Reviewed and agree with assessment and management plan. Reinhold Rickey M, MD  

## 2023-02-13 ENCOUNTER — Ambulatory Visit: Payer: PPO | Admitting: Physician Assistant

## 2023-03-06 ENCOUNTER — Other Ambulatory Visit: Payer: Self-pay

## 2023-03-06 MED ORDER — MOTEGRITY 2 MG PO TABS: ORAL_TABLET | ORAL | 1 refills | Status: DC

## 2023-03-09 ENCOUNTER — Other Ambulatory Visit: Payer: Self-pay | Admitting: *Deleted

## 2023-03-09 MED ORDER — MOTEGRITY 2 MG PO TABS: ORAL_TABLET | ORAL | 1 refills | Status: DC

## 2023-03-10 DIAGNOSIS — E1122 Type 2 diabetes mellitus with diabetic chronic kidney disease: Secondary | ICD-10-CM | POA: Diagnosis not present

## 2023-03-10 DIAGNOSIS — E559 Vitamin D deficiency, unspecified: Secondary | ICD-10-CM | POA: Diagnosis not present

## 2023-03-10 DIAGNOSIS — R7989 Other specified abnormal findings of blood chemistry: Secondary | ICD-10-CM | POA: Diagnosis not present

## 2023-03-10 DIAGNOSIS — K219 Gastro-esophageal reflux disease without esophagitis: Secondary | ICD-10-CM | POA: Diagnosis not present

## 2023-03-10 DIAGNOSIS — F411 Generalized anxiety disorder: Secondary | ICD-10-CM | POA: Diagnosis not present

## 2023-03-10 DIAGNOSIS — E781 Pure hyperglyceridemia: Secondary | ICD-10-CM | POA: Diagnosis not present

## 2023-03-10 DIAGNOSIS — R82998 Other abnormal findings in urine: Secondary | ICD-10-CM | POA: Diagnosis not present

## 2023-03-17 DIAGNOSIS — I700 Atherosclerosis of aorta: Secondary | ICD-10-CM | POA: Diagnosis not present

## 2023-03-17 DIAGNOSIS — M8580 Other specified disorders of bone density and structure, unspecified site: Secondary | ICD-10-CM | POA: Diagnosis not present

## 2023-03-17 DIAGNOSIS — R413 Other amnesia: Secondary | ICD-10-CM | POA: Diagnosis not present

## 2023-03-17 DIAGNOSIS — Z0000 Encounter for general adult medical examination without abnormal findings: Secondary | ICD-10-CM | POA: Diagnosis not present

## 2023-03-17 DIAGNOSIS — M179 Osteoarthritis of knee, unspecified: Secondary | ICD-10-CM | POA: Diagnosis not present

## 2023-03-17 DIAGNOSIS — I129 Hypertensive chronic kidney disease with stage 1 through stage 4 chronic kidney disease, or unspecified chronic kidney disease: Secondary | ICD-10-CM | POA: Diagnosis not present

## 2023-03-17 DIAGNOSIS — E1122 Type 2 diabetes mellitus with diabetic chronic kidney disease: Secondary | ICD-10-CM | POA: Diagnosis not present

## 2023-03-17 DIAGNOSIS — R220 Localized swelling, mass and lump, head: Secondary | ICD-10-CM | POA: Diagnosis not present

## 2023-03-17 DIAGNOSIS — N1831 Chronic kidney disease, stage 3a: Secondary | ICD-10-CM | POA: Diagnosis not present

## 2023-03-17 DIAGNOSIS — E871 Hypo-osmolality and hyponatremia: Secondary | ICD-10-CM | POA: Diagnosis not present

## 2023-03-17 DIAGNOSIS — E781 Pure hyperglyceridemia: Secondary | ICD-10-CM | POA: Diagnosis not present

## 2023-03-17 DIAGNOSIS — E559 Vitamin D deficiency, unspecified: Secondary | ICD-10-CM | POA: Diagnosis not present

## 2023-04-09 DIAGNOSIS — M170 Bilateral primary osteoarthritis of knee: Secondary | ICD-10-CM | POA: Diagnosis not present

## 2023-04-17 ENCOUNTER — Encounter (HOSPITAL_COMMUNITY): Payer: Self-pay

## 2023-04-17 ENCOUNTER — Inpatient Hospital Stay (HOSPITAL_COMMUNITY): Payer: HMO

## 2023-04-17 ENCOUNTER — Emergency Department (HOSPITAL_COMMUNITY): Payer: HMO

## 2023-04-17 ENCOUNTER — Other Ambulatory Visit: Payer: Self-pay

## 2023-04-17 ENCOUNTER — Inpatient Hospital Stay (HOSPITAL_COMMUNITY)
Admission: EM | Admit: 2023-04-17 | Discharge: 2023-04-19 | DRG: 390 | Disposition: A | Discharge status: Home / Self Care | Payer: HMO | Source: Skilled Nursing Facility | Attending: Internal Medicine | Admitting: Internal Medicine

## 2023-04-17 DIAGNOSIS — E1122 Type 2 diabetes mellitus with diabetic chronic kidney disease: Secondary | ICD-10-CM | POA: Diagnosis present

## 2023-04-17 DIAGNOSIS — Z7982 Long term (current) use of aspirin: Secondary | ICD-10-CM

## 2023-04-17 DIAGNOSIS — Z8049 Family history of malignant neoplasm of other genital organs: Secondary | ICD-10-CM | POA: Diagnosis not present

## 2023-04-17 DIAGNOSIS — I129 Hypertensive chronic kidney disease with stage 1 through stage 4 chronic kidney disease, or unspecified chronic kidney disease: Secondary | ICD-10-CM | POA: Diagnosis present

## 2023-04-17 DIAGNOSIS — Z90710 Acquired absence of both cervix and uterus: Secondary | ICD-10-CM | POA: Diagnosis not present

## 2023-04-17 DIAGNOSIS — E1165 Type 2 diabetes mellitus with hyperglycemia: Secondary | ICD-10-CM | POA: Diagnosis not present

## 2023-04-17 DIAGNOSIS — D72825 Bandemia: Secondary | ICD-10-CM | POA: Diagnosis not present

## 2023-04-17 DIAGNOSIS — K56609 Unspecified intestinal obstruction, unspecified as to partial versus complete obstruction: Secondary | ICD-10-CM | POA: Diagnosis not present

## 2023-04-17 DIAGNOSIS — K56600 Partial intestinal obstruction, unspecified as to cause: Principal | ICD-10-CM | POA: Diagnosis present

## 2023-04-17 DIAGNOSIS — R110 Nausea: Secondary | ICD-10-CM | POA: Diagnosis not present

## 2023-04-17 DIAGNOSIS — N281 Cyst of kidney, acquired: Secondary | ICD-10-CM | POA: Diagnosis not present

## 2023-04-17 DIAGNOSIS — I10 Essential (primary) hypertension: Secondary | ICD-10-CM | POA: Diagnosis present

## 2023-04-17 DIAGNOSIS — R739 Hyperglycemia, unspecified: Secondary | ICD-10-CM | POA: Diagnosis not present

## 2023-04-17 DIAGNOSIS — Z66 Do not resuscitate: Secondary | ICD-10-CM | POA: Diagnosis not present

## 2023-04-17 DIAGNOSIS — E781 Pure hyperglyceridemia: Secondary | ICD-10-CM | POA: Diagnosis not present

## 2023-04-17 DIAGNOSIS — Z8719 Personal history of other diseases of the digestive system: Secondary | ICD-10-CM | POA: Diagnosis not present

## 2023-04-17 DIAGNOSIS — Z794 Long term (current) use of insulin: Secondary | ICD-10-CM

## 2023-04-17 DIAGNOSIS — K5669 Other partial intestinal obstruction: Secondary | ICD-10-CM | POA: Diagnosis not present

## 2023-04-17 DIAGNOSIS — K449 Diaphragmatic hernia without obstruction or gangrene: Secondary | ICD-10-CM | POA: Diagnosis not present

## 2023-04-17 DIAGNOSIS — K581 Irritable bowel syndrome with constipation: Secondary | ICD-10-CM | POA: Diagnosis present

## 2023-04-17 DIAGNOSIS — Z6830 Body mass index (BMI) 30.0-30.9, adult: Secondary | ICD-10-CM | POA: Diagnosis not present

## 2023-04-17 DIAGNOSIS — Z79899 Other long term (current) drug therapy: Secondary | ICD-10-CM

## 2023-04-17 DIAGNOSIS — N1831 Chronic kidney disease, stage 3a: Secondary | ICD-10-CM | POA: Diagnosis present

## 2023-04-17 DIAGNOSIS — Z9049 Acquired absence of other specified parts of digestive tract: Secondary | ICD-10-CM

## 2023-04-17 DIAGNOSIS — E669 Obesity, unspecified: Secondary | ICD-10-CM | POA: Diagnosis present

## 2023-04-17 DIAGNOSIS — R8290 Unspecified abnormal findings in urine: Secondary | ICD-10-CM | POA: Diagnosis not present

## 2023-04-17 DIAGNOSIS — R1084 Generalized abdominal pain: Secondary | ICD-10-CM | POA: Diagnosis not present

## 2023-04-17 DIAGNOSIS — Z4659 Encounter for fitting and adjustment of other gastrointestinal appliance and device: Secondary | ICD-10-CM | POA: Diagnosis not present

## 2023-04-17 DIAGNOSIS — R1111 Vomiting without nausea: Secondary | ICD-10-CM | POA: Diagnosis not present

## 2023-04-17 LAB — COMPREHENSIVE METABOLIC PANEL
ALT: 18 U/L (ref 0–44)
AST: 26 U/L (ref 15–41)
Albumin: 4.1 g/dL (ref 3.5–5.0)
Alkaline Phosphatase: 36 U/L — ABNORMAL LOW (ref 38–126)
Anion gap: 14 (ref 5–15)
BUN: 32 mg/dL — ABNORMAL HIGH (ref 8–23)
CO2: 22 mmol/L (ref 22–32)
Calcium: 10.4 mg/dL — ABNORMAL HIGH (ref 8.9–10.3)
Chloride: 98 mmol/L (ref 98–111)
Creatinine, Ser: 1.04 mg/dL — ABNORMAL HIGH (ref 0.44–1.00)
GFR, Estimated: 49 mL/min — ABNORMAL LOW (ref >60)
Glucose, Bld: 332 mg/dL — ABNORMAL HIGH (ref 70–99)
Potassium: 4.3 mmol/L (ref 3.5–5.1)
Sodium: 134 mmol/L — ABNORMAL LOW (ref 135–145)
Total Bilirubin: 0.8 mg/dL (ref 0.3–1.2)
Total Protein: 7.7 g/dL (ref 6.5–8.1)

## 2023-04-17 LAB — GLUCOSE, CAPILLARY
Glucose-Capillary: 249 mg/dL — ABNORMAL HIGH (ref 70–99)
Glucose-Capillary: 179 mg/dL — ABNORMAL HIGH (ref 70–99)
Glucose-Capillary: 205 mg/dL — ABNORMAL HIGH (ref 70–99)
Glucose-Capillary: 179 mg/dL — ABNORMAL HIGH (ref 70–99)

## 2023-04-17 LAB — CBC
HCT: 42.2 % (ref 36.0–46.0)
HCT: 39.7 % (ref 36.0–46.0)
Hemoglobin: 13.6 g/dL (ref 12.0–15.0)
Hemoglobin: 13 g/dL (ref 12.0–15.0)
MCH: 29.4 pg (ref 26.0–34.0)
MCH: 30 pg (ref 26.0–34.0)
MCHC: 32.2 g/dL (ref 30.0–36.0)
MCHC: 32.7 g/dL (ref 30.0–36.0)
MCV: 91.3 fL (ref 80.0–100.0)
MCV: 91.5 fL (ref 80.0–100.0)
Platelets: 269 10*3/uL (ref 150–400)
Platelets: 244 10*3/uL (ref 150–400)
RBC: 4.62 MIL/uL (ref 3.87–5.11)
RBC: 4.34 MIL/uL (ref 3.87–5.11)
RDW: 14.3 % (ref 11.5–15.5)
RDW: 14.1 % (ref 11.5–15.5)
WBC: 16.9 10*3/uL — ABNORMAL HIGH (ref 4.0–10.5)
WBC: 14.7 10*3/uL — ABNORMAL HIGH (ref 4.0–10.5)
nRBC: 0 % (ref 0.0–0.2)
nRBC: 0 % (ref 0.0–0.2)

## 2023-04-17 LAB — URINALYSIS, ROUTINE W REFLEX MICROSCOPIC
Bilirubin Urine: NEGATIVE
Glucose, UA: >=500 mg/dL — AB
Hgb urine dipstick: NEGATIVE
Ketones, ur: 5 mg/dL — AB
Nitrite: POSITIVE — AB
Protein, ur: NEGATIVE mg/dL
Specific Gravity, Urine: 1.042 — ABNORMAL HIGH (ref 1.005–1.030)
pH: 5 (ref 5.0–8.0)

## 2023-04-17 LAB — BASIC METABOLIC PANEL
Anion gap: 12 (ref 5–15)
BUN: 28 mg/dL — ABNORMAL HIGH (ref 8–23)
CO2: 23 mmol/L (ref 22–32)
Calcium: 10 mg/dL (ref 8.9–10.3)
Chloride: 97 mmol/L — ABNORMAL LOW (ref 98–111)
Creatinine, Ser: 1.01 mg/dL — ABNORMAL HIGH (ref 0.44–1.00)
GFR, Estimated: 51 mL/min — ABNORMAL LOW (ref >60)
Glucose, Bld: 278 mg/dL — ABNORMAL HIGH (ref 70–99)
Potassium: 4 mmol/L (ref 3.5–5.1)
Sodium: 132 mmol/L — ABNORMAL LOW (ref 135–145)

## 2023-04-17 LAB — CBG MONITORING, ED
Glucose-Capillary: 305 mg/dL — ABNORMAL HIGH (ref 70–99)
Glucose-Capillary: 317 mg/dL — ABNORMAL HIGH (ref 70–99)

## 2023-04-17 LAB — LIPASE, BLOOD: Lipase: 30 U/L (ref 11–51)

## 2023-04-17 LAB — MAGNESIUM: Magnesium: 2.2 mg/dL (ref 1.7–2.4)

## 2023-04-17 LAB — HEMOGLOBIN A1C
Hgb A1c MFr Bld: 8.4 % — ABNORMAL HIGH (ref 4.8–5.6)
Mean Plasma Glucose: 194.38 mg/dL

## 2023-04-17 MED ORDER — DIATRIZOATE MEGLUMINE & SODIUM 66-10 % PO SOLN
90.0000 mL | Freq: Once | ORAL | Status: AC
  Administered 2023-04-17: 90 mL via NASOGASTRIC
  Filled 2023-04-17: qty 90

## 2023-04-17 MED ORDER — ONDANSETRON HCL 4 MG/2ML IJ SOLN
4.0000 mg | Freq: Once | INTRAMUSCULAR | Status: AC
  Administered 2023-04-17: 4 mg via INTRAVENOUS
  Filled 2023-04-17: qty 2

## 2023-04-17 MED ORDER — IOHEXOL 300 MG/ML  SOLN
50.0000 mL | Freq: Once | INTRAMUSCULAR | Status: AC | PRN
  Administered 2023-04-17: 50 mL

## 2023-04-17 MED ORDER — ONDANSETRON HCL 4 MG PO TABS: 4.0000 mg | ORAL_TABLET | Freq: Four times a day (QID) | ORAL | Status: DC | PRN

## 2023-04-17 MED ORDER — LACTATED RINGERS IV SOLN: INTRAVENOUS | Status: DC

## 2023-04-17 MED ORDER — HEPARIN SODIUM (PORCINE) 5000 UNIT/ML IJ SOLN
5000.0000 [IU] | Freq: Three times a day (TID) | INTRAMUSCULAR | Status: DC
  Administered 2023-04-17 – 2023-04-19 (×7): 5000 [IU] via SUBCUTANEOUS
  Filled 2023-04-17 (×6): qty 1

## 2023-04-17 MED ORDER — ONDANSETRON HCL 4 MG/2ML IJ SOLN
4.0000 mg | Freq: Four times a day (QID) | INTRAMUSCULAR | Status: DC | PRN
  Administered 2023-04-17: 4 mg via INTRAVENOUS
  Filled 2023-04-17: qty 2

## 2023-04-17 MED ORDER — IOHEXOL 300 MG/ML  SOLN
80.0000 mL | Freq: Once | INTRAMUSCULAR | Status: AC | PRN
  Administered 2023-04-17: 80 mL via INTRAVENOUS

## 2023-04-17 MED ORDER — LORAZEPAM 0.5 MG PO TABS
0.2500 mg | ORAL_TABLET | Freq: Three times a day (TID) | ORAL | Status: DC | PRN
  Administered 2023-04-17 – 2023-04-18 (×2): 0.25 mg via ORAL
  Filled 2023-04-17 (×2): qty 1

## 2023-04-17 MED ORDER — ACETAMINOPHEN 325 MG PO TABS: 650.0000 mg | ORAL_TABLET | Freq: Four times a day (QID) | ORAL | Status: DC | PRN

## 2023-04-17 MED ORDER — LORAZEPAM 2 MG/ML IJ SOLN
0.5000 mg | Freq: Once | INTRAMUSCULAR | Status: AC
  Administered 2023-04-17: 0.5 mg via INTRAVENOUS
  Filled 2023-04-17: qty 1

## 2023-04-17 MED ORDER — LABETALOL HCL 5 MG/ML IV SOLN: 10.0000 mg | INTRAVENOUS | Status: DC | PRN

## 2023-04-17 MED ORDER — SODIUM CHLORIDE (PF) 0.9 % IJ SOLN
INTRAMUSCULAR | Status: AC
  Filled 2023-04-17: qty 50

## 2023-04-17 MED ORDER — FENTANYL CITRATE PF 50 MCG/ML IJ SOSY
50.0000 ug | PREFILLED_SYRINGE | Freq: Once | INTRAMUSCULAR | Status: AC
  Administered 2023-04-17: 50 ug via INTRAVENOUS
  Filled 2023-04-17: qty 1

## 2023-04-17 MED ORDER — FENTANYL CITRATE PF 50 MCG/ML IJ SOSY
12.5000 ug | PREFILLED_SYRINGE | INTRAMUSCULAR | Status: DC | PRN
  Administered 2023-04-17: 25 ug via INTRAVENOUS
  Administered 2023-04-17: 12.5 ug via INTRAVENOUS
  Administered 2023-04-17 – 2023-04-18 (×3): 25 ug via INTRAVENOUS
  Filled 2023-04-17 (×5): qty 1

## 2023-04-17 MED ORDER — SODIUM CHLORIDE 0.9 % IV SOLN: INTRAVENOUS | Status: DC

## 2023-04-17 MED ORDER — LIVING WELL WITH DIABETES BOOK
Freq: Once | Status: AC
  Filled 2023-04-17: qty 1

## 2023-04-17 MED ORDER — INSULIN ASPART 100 UNIT/ML IJ SOLN
0.0000 [IU] | INTRAMUSCULAR | Status: DC
  Administered 2023-04-17 (×2): 2 [IU] via SUBCUTANEOUS
  Administered 2023-04-17: 4 [IU] via SUBCUTANEOUS
  Administered 2023-04-18 – 2023-04-19 (×3): 1 [IU] via SUBCUTANEOUS
  Filled 2023-04-17: qty 0.06

## 2023-04-17 MED ORDER — LACTATED RINGERS IV BOLUS
500.0000 mL | Freq: Once | INTRAVENOUS | Status: AC
  Administered 2023-04-17: 500 mL via INTRAVENOUS

## 2023-04-17 MED ORDER — BENZOCAINE 20 % MT AERO
INHALATION_SPRAY | Freq: Once | OROMUCOSAL | Status: DC
  Filled 2023-04-17 (×2): qty 57

## 2023-04-17 MED ORDER — ACETAMINOPHEN 650 MG RE SUPP: 650.0000 mg | Freq: Four times a day (QID) | RECTAL | Status: DC | PRN

## 2023-04-17 NOTE — ED Notes (Signed)
ED TO INPATIENT HANDOFF REPORT  ED Nurse Name and Phone #:  Edyth Gunnels RN [phone]  S Name/Age/Gender Kimberly Mcgee 89 y.o. female Room/Bed: WA13/WA13  Code Status   Code Status: Full Code  Home/SNF/Other Skilled nursing facility Patient oriented to: self, place, time, and situation Is this baseline? Yes   Triage Complete: Triage complete  Chief Complaint SBO (small bowel obstruction) [K56.609]  Triage Note Pt presents to ED for evaluation of  N/V since 1800. Pt denies pain at this time.    Allergies No Known Allergies  Level of Care/Admitting Diagnosis ED Disposition     ED Disposition  Admit   Condition  --   Comment  Hospital [facility]: [facility] [[id number]]  Level of Care: Med-Surg [16]  May admit patient to [facility] or [facility] if equivalent level of care is available:: Yes  Covid Evaluation: Asymptomatic - no recent exposure (last 10 days) testing not required  Diagnosis: SBO (small bowel obstruction) [469629]  Admitting Physician: Briscoe Deutscher [[id number]]  Attending Physician: Briscoe Deutscher [[id number]]  Certification:: I certify this patient will need inpatient services for at least 2 midnights  Estimated Length of Stay: 4          B Medical/Surgery History Past Medical History:  Diagnosis Date   Arthritis    right knee   Bladder prolapse, female, acquired    Gallstones    Hypertension    Past Surgical History:  Procedure Laterality Date   ABDOMINAL HYSTERECTOMY     CHOLECYSTECTOMY N/A 08/23/2019   Procedure: LAPAROSCOPIC CHOLECYSTECTOMY;  Surgeon: Emelia Loron, MD;  Location: [facility];  Service: General;  Laterality: N/A;  GENERAL WITH TAP BLOCK BILATERALLY   HEMORROIDECTOMY     x 2   KNEE SURGERY       A IV Location/Drains/Wounds Patient Lines/Drains/Airways Status     Active Line/Drains/Airways     Name Placement date Placement time Site Days   Peripheral IV 04/17/23 20 G Left Antecubital 04/17/23  0124   Antecubital  less than 1   Incision (Closed) 08/23/19 Abdomen Other (Comment) 08/23/19  1142  -- 1333   Incision - 4 Ports Abdomen 1: Right;Medial 2: Right;Lateral 3: Upper;Mid 4: Umbilicus 08/23/19  1203  -- 1333            Intake/Output Last 24 hours No intake or output data in the 24 hours ending 04/17/23 0358  Labs/Imaging Results for orders placed or performed during the hospital encounter of 04/17/23 (from the past 48 hour(s))  CBG monitoring, ED     Status: Abnormal   Collection Time: 04/17/23  1:20 AM  Result Value Ref Range   Glucose-Capillary 305 (H) 70 - 99 mg/dL    Comment: Glucose reference range applies only to samples taken after fasting for at least 8 hours.  Lipase, blood     Status: None   Collection Time: 04/17/23  1:22 AM  Result Value Ref Range   Lipase 30 11 - 51 U/L    Comment: Performed at [facility], [address] [place], [place] [address]  Comprehensive metabolic panel     Status: Abnormal   Collection Time: 04/17/23  1:22 AM  Result Value Ref Range   Sodium 134 (L) 135 - 145 mmol/L   Potassium 4.3 3.5 - 5.1 mmol/L   Chloride 98 98 - 111 mmol/L   CO2 22 22 - 32 mmol/L   Glucose, Bld 332 (H) 70 - 99 mg/dL  Comment: Glucose reference range applies only to samples taken after fasting for at least 8 hours.   BUN 32 (H) 8 - 23 mg/dL   Creatinine, Ser 1.30 (H) 0.44 - 1.00 mg/dL   Calcium 86.5 (H) 8.9 - 10.3 mg/dL   Total Protein 7.7 6.5 - 8.1 g/dL   Albumin 4.1 3.5 - 5.0 g/dL   AST 26 15 - 41 U/L   ALT 18 0 - 44 U/L   Alkaline Phosphatase 36 (L) 38 - 126 U/L   Total Bilirubin 0.8 0.3 - 1.2 mg/dL   GFR, Estimated 49 (L) >60 mL/min    Comment: (NOTE) Calculated using the CKD-EPI Creatinine Equation (2021)    Anion gap 14 5 - 15    Comment: Performed at [facility], [address], [place], [place] [address]  CBC     Status: Abnormal   Collection Time: 04/17/23  1:22 AM  Result Value Ref Range   WBC  16.9 (H) 4.0 - 10.5 K/uL   RBC 4.62 3.87 - 5.11 MIL/uL   Hemoglobin 13.6 12.0 - 15.0 g/dL   HCT 62.9 52.8 - 41.3 %   MCV 91.3 80.0 - 100.0 fL   MCH 29.4 26.0 - 34.0 pg   MCHC 32.2 30.0 - 36.0 g/dL   RDW 24.4 01.0 - 27.2 %   Platelets 269 150 - 400 K/uL   nRBC 0.0 0.0 - 0.2 %    Comment: Performed at [facility], [address] [place], [place] [address]  Urinalysis, Routine w reflex microscopic -Urine, Clean Catch     Status: Abnormal   Collection Time: 04/17/23  2:52 AM  Result Value Ref Range   Color, Urine YELLOW YELLOW   APPearance HAZY (A) CLEAR   Specific Gravity, Urine 1.042 (H) 1.005 - 1.030   pH 5.0 5.0 - 8.0   Glucose, UA >=500 (A) NEGATIVE mg/dL   Hgb urine dipstick NEGATIVE NEGATIVE   Bilirubin Urine NEGATIVE NEGATIVE   Ketones, ur 5 (A) NEGATIVE mg/dL   Protein, ur NEGATIVE NEGATIVE mg/dL   Nitrite POSITIVE (A) NEGATIVE   Leukocytes,Ua TRACE (A) NEGATIVE   RBC / HPF 0-5 0 - 5 RBC/hpf   WBC, UA 0-5 0 - 5 WBC/hpf   Bacteria, UA RARE (A) NONE SEEN   Squamous Epithelial / HPF 0-5 0 - 5 /HPF   Mucus PRESENT     Comment: Performed at [facility], [address], [place], [place] [address]   CT ABDOMEN PELVIS W CONTRAST  Result Date: 04/17/2023 CLINICAL DATA:  Abdominal pain, nausea/vomiting EXAM: CT ABDOMEN AND PELVIS WITH CONTRAST TECHNIQUE: Multidetector CT imaging of the abdomen and pelvis was performed using the standard protocol following bolus administration of intravenous contrast. RADIATION DOSE REDUCTION: This exam was performed according to the departmental dose-optimization program which includes automated exposure control, adjustment of the mA and/or kV according to patient size and/or use of iterative reconstruction technique. CONTRAST:  80mL OMNIPAQUE IOHEXOL 300 MG/ML  SOLN COMPARISON:  07/15/2019 FINDINGS: Lower chest: Lung bases are clear. Hepatobiliary: Mildly nodular hepatic contour. Status post cholecystectomy. No  intrahepatic or extrahepatic duct dilatation. Pancreas: Small pancreatic cysts measuring up to 11 mm in the pancreatic tail (series 2/image 20), unchanged from 2020. No follow-up is recommended given the patient's age. Spleen: Within normal limits. Adrenals/Urinary Tract: Adrenal glands are within normal limits. 17 mm simple anterior left lower pole renal cyst (series 2/image 32), benign (Bosniak I). Left renal sinus cysts. No focal  recommended. Right kidney is within normal limits. No hydronephrosis. Bladder is underdistended. 3 mm layering bladder calculus (series 2/image 66). Stomach/Bowel: Stomach is notable for a moderate hiatal hernia. Multiple dilated loops of small bowel in the left mid abdomen, suggesting small bowel obstruction, with transition involving a thickened loop in the right anterior abdomen (series 2/image 42). Decompressed loops in the right lower quadrant. Normal appendix (series 2/image 49). Left colonic diverticulosis, without evidence of diverticulitis. Vascular/Lymphatic: No evidence of abdominal aortic aneurysm. Atherosclerotic calcifications of the abdominal aorta and branch vessels. No suspicious abdominopelvic lymphadenopathy. Reproductive: Status post hysterectomy. Bilateral ovaries are within normal limits. Other: No abdominopelvic ascites. Musculoskeletal: Degenerative changes of the visualized thoracolumbar spine. Grade 1 anterolisthesis of L4 on L5. IMPRESSION: Small bowel obstruction with transition in the right anterior abdomen. Additional ancillary findings as above. Electronically Signed   By: Charline Bills M.D.   On: 04/17/2023 02:37    Pending Labs Unresulted Labs (From admission, onward)     Start     Ordered   04/17/23 0500  Hemoglobin A1c  Tomorrow morning,   R       Comments: To assess prior glycemic control    04/17/23 0318   04/17/23 0500  Basic metabolic panel  Daily,   R      04/17/23 0319   04/17/23 0500  Magnesium  Tomorrow morning,   R         04/17/23 0319   04/17/23 0500  CBC  Daily,   R      04/17/23 0319            Vitals/Pain Today's Vitals   04/17/23 0115 04/17/23 0118 04/17/23 0131  BP:  (!) 178/94 122/75  Pulse:  75   Resp:  14   Temp:  98.6 F (37 C)   TempSrc:  Oral   SpO2:  97%   Weight: 74.4 kg 72.6 kg   Height:  (1.626 m)  (1.549 m)   PainSc: 0-No pain      Isolation Precautions No active isolations  Medications Medications  insulin aspart (novoLOG) injection 0-6 Units (has no administration in time range)  heparin injection 5,000 Units (has no administration in time range)  acetaminophen (TYLENOL) tablet 650 mg (has no administration in time range)    Or  acetaminophen (TYLENOL) suppository 650 mg (has no administration in time range)  lactated ringers infusion (has no administration in time range)  lactated ringers bolus 500 mL (has no administration in time range)  fentaNYL (SUBLIMAZE) injection 12.5-25 mcg (has no administration in time range)  ondansetron (ZOFRAN) tablet 4 mg (has no administration in time range)    Or  ondansetron (ZOFRAN) injection 4 mg (has no administration in time range)  labetalol (NORMODYNE) injection 10 mg (has no administration in time range)  Benzocaine (HURRCAINE) 20 % mouth spray (has no administration in time range)  fentaNYL (SUBLIMAZE) injection 50 mcg (50 mcg Intravenous Given 04/17/23 0151)  ondansetron (ZOFRAN) injection 4 mg (4 mg Intravenous Given 04/17/23 0151)  iohexol (OMNIPAQUE) 300 MG/ML solution 80 mL (80 mLs Intravenous Contrast Given 04/17/23 0208)  LORazepam (ATIVAN) injection 0.5 mg (0.5 mg Intravenous Given 04/17/23 0304)    Mobility non-ambulatory     Focused Assessments Cardiac Assessment Handoff:    No results found for: "CKTOTAL", "CKMB", "CKMBINDEX", "TROPONINI" No results found for: "DDIMER" Does the Patient currently have chest pain? No   , Pulmonary Assessment Handoff:  Lung sounds:   O2 Device: Room Air  R Recommendations: See Admitting Provider Note  Report given to:   Additional Notes:

## 2023-04-17 NOTE — TOC Initial Note (Signed)
Transition of Care [facility]) - Initial/Assessment Note    Patient Details  Name: Kimberly Mcgee MRN: [id number] Date of Birth: Aug 28, 1925  Transition of Care [facility]) CM/SW Contact:    Coralyn Helling, LCSW Phone Number: 04/17/2023, 10:31 AM  Clinical Narrative:                Patient from [facility] independent living. Patient is independent in ADLs at baseline.   PLAN: TBD toc to follow for needs.    Expected Discharge Plan: Home/Self Care Barriers to Discharge: Continued Medical Work up   Patient Goals and CMS Choice Patient states their goals for this hospitalization and ongoing recovery are:: Return home   Choice offered to / list presented to : NA      Expected Discharge Plan and Services In-house Referral: NA Discharge Planning Services: NA Post Acute Care Choice: NA Living arrangements for the past 2 months: Independent Living Facility                 DME Arranged: N/A DME Agency: NA         HH Agency: NA        Prior Living Arrangements/Services Living arrangements for the past 2 months: Independent Living Facility Lives with:: Self Patient language and need for interpreter reviewed:: Yes Do you feel safe going back to the place where you live?: Yes      Need for Family Participation in Patient Care: Yes (Comment)     Criminal Activity/Legal Involvement Pertinent to Current Situation/Hospitalization: No - Comment as needed  Activities of Daily Living Home Assistive Devices/Equipment: None, Walker (specify type) ADL Screening (condition at time of admission) Patient's cognitive ability adequate to safely complete daily activities?: Yes Is the patient deaf or have difficulty hearing?: No Does the patient have difficulty seeing, even when wearing glasses/contacts?: No Does the patient have difficulty concentrating, remembering, or making decisions?: No Patient able to express need for assistance with ADLs?: Yes Does the patient have difficulty dressing or  bathing?: No Independently performs ADLs?: Yes (appropriate for developmental age) Does the patient have difficulty walking or climbing stairs?: No Weakness of Legs: Both Weakness of Arms/Hands: None  Permission Sought/Granted Permission sought to share information with : Family Supports    Share Information with NAME: Son, Chip           Emotional Assessment Appearance:: Appears younger than stated age Attitude/Demeanor/Rapport: Engaged Affect (typically observed): Pleasant Orientation: : Oriented to Self, Oriented to Place, Oriented to  Time, Oriented to Situation Alcohol / Substance Use: Not Applicable Psych Involvement: No (comment)  Admission diagnosis:  SBO (small bowel obstruction) [K56.609] Patient Active Problem List   Diagnosis Date Noted   SBO (small bowel obstruction) 04/17/2023   Hypertension 04/17/2023   Hyperglycemia 04/17/2023   Chronic constipation 02/04/2023   S/P laparoscopic cholecystectomy 08/23/2019   Encephalopathy 06/08/2017   PCP:  Creola Corn, MD Pharmacy:   [facility] [address] [place] - [address] [place] [address] Phone: [phone] Fax: [phone]  [id number] [place], [place] - [address] AT [facility] [address] & [facility] [address] [place] [address] Phone: [phone] Fax: [phone]  [facility] #[id number] [place], [place] - [address] AT [facility] OF [address] [place] [address] Phone: [phone] Fax: [phone]     Social Determinants of Health (SDOH) Social History: SDOH Screenings   Food Insecurity:  No Food Insecurity (04/17/2023)  Housing: Low Risk  (04/17/2023)  Transportation Needs: No Transportation Needs (04/17/2023)  Utilities: Not At Risk (04/17/2023)  Tobacco Use: Low Risk  (04/17/2023)   SDOH Interventions:     Readmission Risk Interventions     No data to display

## 2023-04-17 NOTE — ED Notes (Signed)
NG attempted at both nares with no success. Provider notified.

## 2023-04-17 NOTE — Consult Note (Signed)
Kimberly Mcgee, Kimberly Mcgee July 25, 1925  [id number].    Requesting MD: Dr. Alanda Slim Chief Complaint/Reason for Consult: SBO  HPI: Kimberly Mcgee is a 89 y.o. female with a hx of HTN and chronic constipation who presented to the ED for abdominal pain.   Patient reports that yesterday she was doing well and had a normal BM in the morning.  Throughout the day she began having generalized abdominal pain, bloating/distension and nausea.  She tried oatmeal for dinner which worsened her symptoms. Reports emesis x1. No further episodes of vomiting.  Last episode of flatus was shortly after onset of her symptoms.  No flatus today.  Last BM was yesterday morning.  In the ED she was afebrile without tachycardia or hypotension.  WBC 16.9 > 14.7.  CT A/P with SBO with transition in the right anterior abdomen.  NGT was attempted to be placed overnight but was unsuccessful secondary to patient's large hiatal hernia.  Primary team is asked radiology to assist with placement fluoroscopically. At this time her generalized abdominal pain and bloating are stable to slightly improved since presentation.  Her nausea has resolved.  She notes she suffers from chronic constipation is followed by Dr. Lauro Franklin office at [facility].  She has previously been on Linzess, Movantik and Amitiza without relief.  She is currently on Motegrity and MiraLAX daily and reports over the last 6 months has been doing well with daily bowel movements.  Her last colonoscopy was in 2006 by Dr. Rhea Belton that showed a stricture/stenosis of the rectum that was dilated.  Otherwise the remainder of her colonoscopy was reassuring.  She had a flex sig done in 2020 for an anal stricture that was dilated.   She reports similar symptoms earlier this year that resolved after she backed her diet down to liquids and took MiraLAX bowel purge over the course of the weekend.   Prior Abdominal Surgeries: Cholecystectomy, abdominal hysterectomy Blood Thinners: None Tobacco  Use: None Alcohol Use: None Employment: Retired.  Lives at [facility]  ROS: ROS As above, see hpi  Family History  Problem Relation Age of Onset   Uterine cancer Mother    Liver disease Neg Hx    Colon cancer Neg Hx    Pancreatic cancer Neg Hx    Stomach cancer Neg Hx    Rectal cancer Neg Hx    Esophageal cancer Neg Hx     Past Medical History:  Diagnosis Date   Arthritis    right knee   Bladder prolapse, female, acquired    Gallstones    Hypertension     Past Surgical History:  Procedure Laterality Date   ABDOMINAL HYSTERECTOMY     CHOLECYSTECTOMY N/A 08/23/2019   Procedure: LAPAROSCOPIC CHOLECYSTECTOMY;  Surgeon: Emelia Loron, MD;  Location: [facility] OR;  Service: General;  Laterality: N/A;  GENERAL WITH TAP BLOCK BILATERALLY   HEMORROIDECTOMY     x 2   KNEE SURGERY      Social History:  reports that she has never smoked. She has never used smokeless tobacco. She reports that she does not drink alcohol and does not use drugs.  Allergies: No Known Allergies  Medications Prior to Admission  Medication Sig Dispense Refill   Calcium Carbonate-Vitamin D (CALCIUM 600 + D PO) Take 1 tablet by mouth 2 (two) times daily.       citalopram (CELEXA) 20 MG tablet Take 20 mg by mouth daily after breakfast.      losartan-hydrochlorothiazide (HYZAAR) 100-25 MG  per tablet Take 1 tablet by mouth daily after breakfast.      Melatonin 5 MG TABS Take 5 mg by mouth at bedtime as needed (sleep).     Misc Natural Products (COSAMIN ASU ADVANCED FORMULA PO) Take 1 tablet by mouth 2 (two) times daily.     Multiple Vitamins-Minerals (ONE-A-DAY WOMENS 50 PLUS PO) Take 1 tablet by mouth daily.       naproxen sodium (ALEVE) 220 MG tablet Take 440 mg by mouth as needed (pain).     Omega-3 Fatty Acids (FISH OIL PO) Take 1 capsule by mouth 2 (two) times daily.      polyethylene glycol powder (GLYCOLAX/MIRALAX) 17 GM/SCOOP powder Take 17 g by mouth daily as needed.     Prucalopride Succinate  (MOTEGRITY) 2 MG TABS TAKE 1 TABLET(2 MG) BY MOUTH DAILY 90 tablet 1   aspirin EC 81 MG tablet Take 81 mg by mouth every other day.       Physical Exam: Blood pressure (!) 174/81, pulse 72, temperature 97.8 F (36.6 C), temperature source Oral, resp. rate 20, height  (1.549 m), weight 72.6 kg, SpO2 95 %. General: pleasant, WD/WN female who is laying in bed in NAD HEENT: head is normocephalic, atraumatic.  Sclera are noninjected.  PERRL.  Ears and nose without any masses or lesions.  Mouth is pink and moist. Dentition fair Heart: regular, rate, and rhythm.  Palpable pedal pulses bilaterally  Lungs: CTAB, no wheezes, rhonchi, or rales noted.  Respiratory effort nonlabored Abd:  Soft, moderate distension, generalized ttp, some BS.  MS: no BUE or BLE edema Skin: warm and dry  Psych: A&Ox4 with an appropriate affect Neuro: cranial nerves grossly intact, normal speech, thought process intact, moves all extremities, gait not assessed  Results for orders placed or performed during the hospital encounter of 04/17/23 (from the past 48 hour(s))  CBG monitoring, ED     Status: Abnormal   Collection Time: 04/17/23  1:20 AM  Result Value Ref Range   Glucose-Capillary 305 (H) 70 - 99 mg/dL    Comment: Glucose reference range applies only to samples taken after fasting for at least 8 hours.  Lipase, blood     Status: None   Collection Time: 04/17/23  1:22 AM  Result Value Ref Range   Lipase 30 11 - 51 U/L    Comment: Performed at [facility], [address] [place], [place] [address]  Comprehensive metabolic panel     Status: Abnormal   Collection Time: 04/17/23  1:22 AM  Result Value Ref Range   Sodium 134 (L) 135 - 145 mmol/L   Potassium 4.3 3.5 - 5.1 mmol/L   Chloride 98 98 - 111 mmol/L   CO2 22 22 - 32 mmol/L   Glucose, Bld 332 (H) 70 - 99 mg/dL    Comment: Glucose reference range applies only to samples taken after fasting for at least 8 hours.   BUN 32 (H) 8 - 23  mg/dL   Creatinine, Ser 6.04 (H) 0.44 - 1.00 mg/dL   Calcium 54.0 (H) 8.9 - 10.3 mg/dL   Total Protein 7.7 6.5 - 8.1 g/dL   Albumin 4.1 3.5 - 5.0 g/dL   AST 26 15 - 41 U/L   ALT 18 0 - 44 U/L   Alkaline Phosphatase 36 (L) 38 - 126 U/L   Total Bilirubin 0.8 0.3 - 1.2 mg/dL   GFR, Estimated 49 (L) >60 mL/min    Comment: (NOTE) Calculated using  the CKD-EPI Creatinine Equation (2021)    Anion gap 14 5 - 15    Comment: Performed at [facility], [address], [place], [place] [address]  CBC     Status: Abnormal   Collection Time: 04/17/23  1:22 AM  Result Value Ref Range   WBC 16.9 (H) 4.0 - 10.5 K/uL   RBC 4.62 3.87 - 5.11 MIL/uL   Hemoglobin 13.6 12.0 - 15.0 g/dL   HCT 40.9 81.1 - 91.4 %   MCV 91.3 80.0 - 100.0 fL   MCH 29.4 26.0 - 34.0 pg   MCHC 32.2 30.0 - 36.0 g/dL   RDW 78.2 95.6 - 21.3 %   Platelets 269 150 - 400 K/uL   nRBC 0.0 0.0 - 0.2 %    Comment: Performed at [facility], [address], [place], [place] [address]  Urinalysis, Routine w reflex microscopic -Urine, Clean Catch     Status: Abnormal   Collection Time: 04/17/23  2:52 AM  Result Value Ref Range   Color, Urine YELLOW YELLOW   APPearance HAZY (A) CLEAR   Specific Gravity, Urine 1.042 (H) 1.005 - 1.030   pH 5.0 5.0 - 8.0   Glucose, UA >=500 (A) NEGATIVE mg/dL   Hgb urine dipstick NEGATIVE NEGATIVE   Bilirubin Urine NEGATIVE NEGATIVE   Ketones, ur 5 (A) NEGATIVE mg/dL   Protein, ur NEGATIVE NEGATIVE mg/dL   Nitrite POSITIVE (A) NEGATIVE   Leukocytes,Ua TRACE (A) NEGATIVE   RBC / HPF 0-5 0 - 5 RBC/hpf   WBC, UA 0-5 0 - 5 WBC/hpf   Bacteria, UA RARE (A) NONE SEEN   Squamous Epithelial / HPF 0-5 0 - 5 /HPF   Mucus PRESENT     Comment: Performed at [facility], [address] [place], [place] [address]  CBG monitoring, ED     Status: Abnormal   Collection Time: 04/17/23  4:14 AM  Result Value Ref Range   Glucose-Capillary 317 (H) 70 - 99 mg/dL     Comment: Glucose reference range applies only to samples taken after fasting for at least 8 hours.  Basic metabolic panel     Status: Abnormal   Collection Time: 04/17/23  5:52 AM  Result Value Ref Range   Sodium 132 (L) 135 - 145 mmol/L   Potassium 4.0 3.5 - 5.1 mmol/L   Chloride 97 (L) 98 - 111 mmol/L   CO2 23 22 - 32 mmol/L   Glucose, Bld 278 (H) 70 - 99 mg/dL    Comment: Glucose reference range applies only to samples taken after fasting for at least 8 hours.   BUN 28 (H) 8 - 23 mg/dL   Creatinine, Ser 2.95 (H) 0.44 - 1.00 mg/dL   Calcium 28.4 8.9 - 13.2 mg/dL   GFR, Estimated 51 (L) >60 mL/min    Comment: (NOTE) Calculated using the CKD-EPI Creatinine Equation (2021)    Anion gap 12 5 - 15    Comment: Performed at [facility], [address], [place], [place] [address]  Magnesium     Status: None   Collection Time: 04/17/23  5:52 AM  Result Value Ref Range   Magnesium 2.2 1.7 - 2.4 mg/dL    Comment: Performed at [facility], [address], [place], [place] [address]  CBC     Status: Abnormal   Collection Time: 04/17/23  5:52 AM  Result Value Ref Range   WBC 14.7 (H) 4.0 -  10.5 K/uL   RBC 4.34 3.87 - 5.11 MIL/uL   Hemoglobin 13.0 12.0 - 15.0 g/dL   HCT 13.0 86.5 - 78.4 %   MCV 91.5 80.0 - 100.0 fL   MCH 30.0 26.0 - 34.0 pg   MCHC 32.7 30.0 - 36.0 g/dL   RDW 69.6 29.5 - 28.4 %   Platelets 244 150 - 400 K/uL   nRBC 0.0 0.0 - 0.2 %    Comment: Performed at [facility], [address], [place], [place] [address]  Glucose, capillary     Status: Abnormal   Collection Time: 04/17/23  7:59 AM  Result Value Ref Range   Glucose-Capillary 249 (H) 70 - 99 mg/dL    Comment: Glucose reference range applies only to samples taken after fasting for at least 8 hours.   Comment 1 Notify RN    DG Abdomen 1 View  Addendum Date: 04/17/2023   ADDENDUM REPORT: 04/17/2023 04:31 ADDENDUM: Study discussed by telephone with Dr.  Marcial Pacas OPYD on 04/17/2023 at 0417 hours. Electronically Signed   By: Odessa Fleming M.D.   On: 04/17/2023 04:31   Result Date: 04/17/2023 CLINICAL DATA:  89 year old female NG tube placement. EXAM: ABDOMEN - 1 VIEW COMPARISON:  CT Abdomen and Pelvis 0210 hours today. FINDINGS: AP view at 0308 hours. Enteric tube is looped at the level of the central mediastinum. The tube tip does continue distally to the level of the hiatal hernia partially visible on CT today. Lung bases appear clear. Bowel-gas pattern not significantly changed. Stable cholecystectomy clips. Stable visualized osseous structures. IMPRESSION: 1. Abnormally looped enteric tube at the level of the upper/central mediastinum. This should be removed and repositioned. 2. Chronic gastric hiatal hernia partially visible on CT this morning. Electronically Signed: By: Odessa Fleming M.D. On: 04/17/2023 04:14   CT ABDOMEN PELVIS W CONTRAST  Result Date: 04/17/2023 CLINICAL DATA:  Abdominal pain, nausea/vomiting EXAM: CT ABDOMEN AND PELVIS WITH CONTRAST TECHNIQUE: Multidetector CT imaging of the abdomen and pelvis was performed using the standard protocol following bolus administration of intravenous contrast. RADIATION DOSE REDUCTION: This exam was performed according to the departmental dose-optimization program which includes automated exposure control, adjustment of the mA and/or kV according to patient size and/or use of iterative reconstruction technique. CONTRAST:  80mL OMNIPAQUE IOHEXOL 300 MG/ML  SOLN COMPARISON:  07/15/2019 FINDINGS: Lower chest: Lung bases are clear. Hepatobiliary: Mildly nodular hepatic contour. Status post cholecystectomy. No intrahepatic or extrahepatic duct dilatation. Pancreas: Small pancreatic cysts measuring up to 11 mm in the pancreatic tail (series 2/image 20), unchanged from 2020. No follow-up is recommended given the patient's age. Spleen: Within normal limits. Adrenals/Urinary Tract: Adrenal glands are within normal limits. 17 mm  simple anterior left lower pole renal cyst (series 2/image 32), benign (Bosniak I). Left renal sinus cysts. No focal recommended. Right kidney is within normal limits. No hydronephrosis. Bladder is underdistended. 3 mm layering bladder calculus (series 2/image 66). Stomach/Bowel: Stomach is notable for a moderate hiatal hernia. Multiple dilated loops of small bowel in the left mid abdomen, suggesting small bowel obstruction, with transition involving a thickened loop in the right anterior abdomen (series 2/image 42). Decompressed loops in the right lower quadrant. Normal appendix (series 2/image 49). Left colonic diverticulosis, without evidence of diverticulitis. Vascular/Lymphatic: No evidence of abdominal aortic aneurysm. Atherosclerotic calcifications of the abdominal aorta and branch vessels. No suspicious abdominopelvic lymphadenopathy. Reproductive: Status post hysterectomy. Bilateral ovaries are within normal limits. Other: No abdominopelvic ascites. Musculoskeletal: Degenerative changes of the  visualized thoracolumbar spine. Grade 1 anterolisthesis of L4 on L5. IMPRESSION: Small bowel obstruction with transition in the right anterior abdomen. Additional ancillary findings as above. Electronically Signed   By: Charline Bills M.D.   On: 04/17/2023 02:37    Anti-infectives (From admission, onward)    None       Assessment/Plan SBO - CT w/ Small bowel obstruction with transition in the right anterior abdomen. Hx of cholecystectomy and hysterectomy - No current indication for emergency surgery - Agree with NGT for decompression. Keep NPO - Start SBO protocol once NGT is placed - Keep K > 4 and Mg > 2 for bowel function - Mobilize for bowel function - Hopefully patient will improve with conservative management. If patient fails to improve with conservative management, they may require exploratory surgery during admission - Agree with medical admission. We will follow with you.   FEN - NPO,  NGT to LIWS, IVF per TRH VTE - SCDs, subq heparin ID - None  I reviewed nursing notes, hospitalist notes, last 24 h vitals and pain scores, last 48 h intake and output, last 24 h labs and trends, and last 24 h imaging results.  Jacinto Halim, [facility] 04/17/2023, 10:44 AM Please see Amion for pager number during day hours 7:00am-4:30pm

## 2023-04-17 NOTE — ED Notes (Signed)
Pt assisted with bed pan to obtain urine sample. Pt voided successfully. Urine sample was sent to lab. Pt has no other needs at this time. Call light within reach. Bed locked and in lowest position.

## 2023-04-17 NOTE — TOC Progression Note (Signed)
Transition of Care [facility]) - Progression Note    Patient Details  Name: Kimberly Mcgee MRN: [id number] Date of Birth: April 20, 1925  Transition of Care [facility]) CM/SW Contact  Coralyn Helling, [place] Phone Number: 04/17/2023, 10:08 AM  Clinical Narrative:   Patient from ALF. Appears to be independent at baseline. TOC left message for patient son, Chip to confirm ALF and transportation. TOC to follow for needs.          Expected Discharge Plan and Services                                               Social Determinants of Health (SDOH) Interventions SDOH Screenings   Food Insecurity: No Food Insecurity (04/17/2023)  Housing: Low Risk  (04/17/2023)  Transportation Needs: No Transportation Needs (04/17/2023)  Utilities: Not At Risk (04/17/2023)  Tobacco Use: Low Risk  (04/17/2023)    Readmission Risk Interventions     No data to display

## 2023-04-17 NOTE — ED Triage Notes (Signed)
Pt presents to ED for evaluation of  N/V since 1800. Pt denies pain at this time.

## 2023-04-17 NOTE — Inpatient Diabetes Management (Signed)
Inpatient Diabetes Program Recommendations  AACE/ADA: New Consensus Statement on Inpatient Glycemic Control (2015)  Target Ranges:  Prepandial:   less than 140 mg/dL      Peak postprandial:   less than 180 mg/dL (1-2 hours)      Critically ill patients:  140 - 180 mg/dL   Lab Results  Component Value Date   GLUCAP 179 (H) 04/17/2023   HGBA1C 8.4 (H) 04/17/2023    Review of Glycemic Control  Diabetes history: New DM  Outpatient Diabetes medications: None Current orders for Inpatient glycemic control: Novolog 0-6 units Q4H  Spoke with patient at bedside.  She has been told in the past that she has boarder line diabetes.  Reviewed patient's current A1c of 8.4% (average BG of 194 mg/dL). Explained what a A1c is and what it measures. Also reviewed goal A1c with patient, importance of good glucose control @ home, and blood sugar goals.  Discussed basic pathophysiology of DM Type 2, basic home care, basic diabetes diet nutrition principles, importance of checking CBGs and maintaining good CBG control to prevent long-term and short-term complications. Reviewed signs and symptoms of hyperglycemia and hypoglycemia and how to treat hypoglycemia at home. Also reviewed blood sugar goals at home. RNs to provide ongoing basic DM education at bedside with this patient. Have ordered Living Well with Diabetes Booklet.   Discussed limiting CHO's and eliminating caloric beverages.  She states she drinks Crystal Light and water.  Educated on The Plate Method.    Will continue to follow while inpatient.  Thank you, Dulce Sellar, MSN, CDCES Diabetes Coordinator Inpatient Diabetes Program [phone] (team pager from 8a-5p)

## 2023-04-17 NOTE — Progress Notes (Signed)
PROGRESS NOTE  Kimberly Mcgee:[id number] DOB: 1925/07/06   PCP: Creola Corn, MD  Patient is from: Home.  DOA: 04/17/2023 LOS: 0  Chief complaints Chief Complaint  Patient presents with   Emesis     Brief Narrative / Interim history: 89 year old F with PMH of HTN, hysterectomy and cholecystectomy presenting with nausea, vomiting, abdominal discomfort and bloating for 1 to 2 days and found to have small bowel obstruction and hyperglycemia.  GI consulted and recommended NG tube decompression which was unsuccessful at bedside.  IR consulted for NG tube.   Subjective: Seen and examined earlier this morning.  No major events overnight of this morning.  No further emesis but feels nauseous and bloated.  No bowel movement or gas.  Denies chest pain or dyspnea.  Denies dysuria, frequency or urgency.    Objective: Vitals:   04/17/23 0330 04/17/23 0345 04/17/23 0405 04/17/23 0446  BP: (!) 223/105 (!) 169/96 (!) 150/71 (!) 174/81  Pulse: 91 82 75 72  Resp: (!) Temp:   99.4 F (37.4 C) 97.8 F (36.6 C)  TempSrc:   Oral Oral  SpO2: 100% 99% 97% 95%  Weight:      Height:        Examination:  GENERAL: No apparent distress.  Nontoxic. HEENT: MMM.  Vision and hearing grossly intact.  NECK: Supple.  No apparent JVD.  RESP:  No IWOB.  Fair aeration bilaterally. CVS:  RRR. Heart sounds normal.  ABD/GI/GU: BS+. Abd distended.  Nontender. MSK/EXT:  Moves extremities. No apparent deformity. No edema.  SKIN: no apparent skin lesion or wound NEURO: Awake, alert and oriented x 4.  No apparent focal neuro deficit. PSYCH: Calm. Normal affect.   Procedures:  None  Microbiology summarized: None  Assessment and plan: Principal Problem:   SBO (small bowel obstruction) Active Problems:   Hypertension   Hyperglycemia  Small bowel obstruction: Present with nausea, vomiting and abdominal discomfort.  Prior history of hysterectomy and cholecystectomy.  No further emesis breath  nausea and abdominal distention.  No bowel movement or flatus yet. -General surgery recommended NG tube decompression.  -NGT placement unsuccessful at bedside.  IR consulted. -Continue IV fluid  New onset diabetes with hyperglycemia: A1c 8.4%. Recent Labs  Lab 04/17/23 0120 04/17/23 0414 04/17/23 0759 04/17/23 1202  GLUCAP 305* 317* 249* 179*  -Continue current insulin regimen -Consult diabetic coordinator  CKD-3A: Recent Labs    04/17/23 0122 04/17/23 0552  BUN 32* 28*  CREATININE 1.04* 1.01*  -Continue monitoring   Hypertension: BP elevated. -Start IV metoprolol every 6 hours -IV hydralazine as needed  Bandemia: Likely demargination from #1. -Continue monitoring  Abnormal urinalysis: UA with nitrites.  She denies UTI symptoms.  Advance care planning: Discussed CODE STATUS with patient at bedside.  Patient states "I am 89 and left good life .  I am ready when at this time".  She does not want CPR or intubation.  -CODE STATUS changed to DNR/DNI.  Patient's RN notified  Obesity Body mass index is 30.23 kg/m.          DVT prophylaxis:  heparin injection 5,000 Units Start: 04/17/23 0600  Code Status: DNR/DNI Family Communication: None at bedside Level of care: Med-Surg Status is: Inpatient Remains inpatient appropriate because: Small bowel obstruction   Final disposition: TBD Consultants:  General surgery IR  55 minutes with more than 50% spent in reviewing records, counseling patient/family and coordinating care.   Sch Meds:  Scheduled Meds:  Benzocaine   Mouth/Throat Once   heparin  5,000 Units Subcutaneous Q8H   insulin aspart  0-6 Units Subcutaneous Q4H   Continuous Infusions:  sodium chloride 75 mL/hr at 04/17/23 0825   PRN Meds:.acetaminophen **OR** acetaminophen, fentaNYL (SUBLIMAZE) injection, labetalol, ondansetron **OR** ondansetron (ZOFRAN) IV  Antimicrobials: Anti-infectives (From admission, onward)    None        I have  personally reviewed the following labs and images: CBC: Recent Labs  Lab 04/17/23 0122 04/17/23 0552  WBC 16.9* 14.7*  HGB 13.6 13.0  HCT 42.2 39.7  MCV 91.3 91.5  PLT 269 244   BMP &GFR Recent Labs  Lab 04/17/23 0122 04/17/23 0552  NA 134* 132*  K 4.3 4.0  CL 98 97*  CO2 22 23  GLUCOSE 332* 278*  BUN 32* 28*  CREATININE 1.04* 1.01*  CALCIUM 10.4* 10.0  MG  --  2.2   Estimated Creatinine Clearance: 30.3 mL/min (A) (by C-G formula based on SCr of 1.01 mg/dL (H)). Liver & Pancreas: Recent Labs  Lab 04/17/23 0122  AST 26  ALT 18  ALKPHOS 36*  BILITOT 0.8  PROT 7.7  ALBUMIN 4.1   Recent Labs  Lab 04/17/23 0122  LIPASE 30   No results for input(s): "AMMONIA" in the last 168 hours. Diabetic: Recent Labs    04/17/23 0552  HGBA1C 8.4*   Recent Labs  Lab 04/17/23 0120 04/17/23 0414 04/17/23 0759 04/17/23 1202  GLUCAP 305* 317* 249* 179*   Cardiac Enzymes: No results for input(s): "CKTOTAL", "CKMB", "CKMBINDEX", "TROPONINI" in the last 168 hours. No results for input(s): "PROBNP" in the last 8760 hours. Coagulation Profile: No results for input(s): "INR", "PROTIME" in the last 168 hours. Thyroid Function Tests: No results for input(s): "TSH", "T4TOTAL", "FREET4", "T3FREE", "THYROIDAB" in the last 72 hours. Lipid Profile: No results for input(s): "CHOL", "HDL", "LDLCALC", "TRIG", "CHOLHDL", "LDLDIRECT" in the last 72 hours. Anemia Panel: No results for input(s): "VITAMINB12", "FOLATE", "FERRITIN", "TIBC", "IRON", "RETICCTPCT" in the last 72 hours. Urine analysis:    Component Value Date/Time   COLORURINE YELLOW 04/17/2023 0252   APPEARANCEUR HAZY (A) 04/17/2023 0252   LABSPEC 1.042 (H) 04/17/2023 0252   PHURINE 5.0 04/17/2023 0252   GLUCOSEU >=500 (A) 04/17/2023 0252   HGBUR NEGATIVE 04/17/2023 0252   BILIRUBINUR NEGATIVE 04/17/2023 0252   KETONESUR 5 (A) 04/17/2023 0252   PROTEINUR NEGATIVE 04/17/2023 0252   UROBILINOGEN 0.2 09/25/2015 1211    NITRITE POSITIVE (A) 04/17/2023 0252   LEUKOCYTESUR TRACE (A) 04/17/2023 0252   Sepsis Labs: Invalid input(s): "PROCALCITONIN", "LACTICIDVEN"  Microbiology: No results found for this or any previous visit (from the past 240 hour(s)).  Radiology Studies: DG Abdomen 1 View  Addendum Date: 04/17/2023   ADDENDUM REPORT: 04/17/2023 04:31 ADDENDUM: Study discussed by telephone with Dr. Marcial Pacas OPYD on 04/17/2023 at 0417 hours. Electronically Signed   By: Odessa Fleming M.D.   On: 04/17/2023 04:31   Result Date: 04/17/2023 CLINICAL DATA:  89 year old female NG tube placement. EXAM: ABDOMEN - 1 VIEW COMPARISON:  CT Abdomen and Pelvis 0210 hours today. FINDINGS: AP view at 0308 hours. Enteric tube is looped at the level of the central mediastinum. The tube tip does continue distally to the level of the hiatal hernia partially visible on CT today. Lung bases appear clear. Bowel-gas pattern not significantly changed. Stable cholecystectomy clips. Stable visualized osseous structures. IMPRESSION: 1. Abnormally looped enteric tube at the level of the upper/central mediastinum. This should be removed and repositioned. 2. Chronic  gastric hiatal hernia partially visible on CT this morning. Electronically Signed: By: Odessa Fleming M.D. On: 04/17/2023 04:14   CT ABDOMEN PELVIS W CONTRAST  Result Date: 04/17/2023 CLINICAL DATA:  Abdominal pain, nausea/vomiting EXAM: CT ABDOMEN AND PELVIS WITH CONTRAST TECHNIQUE: Multidetector CT imaging of the abdomen and pelvis was performed using the standard protocol following bolus administration of intravenous contrast. RADIATION DOSE REDUCTION: This exam was performed according to the departmental dose-optimization program which includes automated exposure control, adjustment of the mA and/or kV according to patient size and/or use of iterative reconstruction technique. CONTRAST:  80mL OMNIPAQUE IOHEXOL 300 MG/ML  SOLN COMPARISON:  07/15/2019 FINDINGS: Lower chest: Lung bases are clear.  Hepatobiliary: Mildly nodular hepatic contour. Status post cholecystectomy. No intrahepatic or extrahepatic duct dilatation. Pancreas: Small pancreatic cysts measuring up to 11 mm in the pancreatic tail (series 2/image 20), unchanged from 2020. No follow-up is recommended given the patient's age. Spleen: Within normal limits. Adrenals/Urinary Tract: Adrenal glands are within normal limits. 17 mm simple anterior left lower pole renal cyst (series 2/image 32), benign (Bosniak I). Left renal sinus cysts. No focal recommended. Right kidney is within normal limits. No hydronephrosis. Bladder is underdistended. 3 mm layering bladder calculus (series 2/image 66). Stomach/Bowel: Stomach is notable for a moderate hiatal hernia. Multiple dilated loops of small bowel in the left mid abdomen, suggesting small bowel obstruction, with transition involving a thickened loop in the right anterior abdomen (series 2/image 42). Decompressed loops in the right lower quadrant. Normal appendix (series 2/image 49). Left colonic diverticulosis, without evidence of diverticulitis. Vascular/Lymphatic: No evidence of abdominal aortic aneurysm. Atherosclerotic calcifications of the abdominal aorta and branch vessels. No suspicious abdominopelvic lymphadenopathy. Reproductive: Status post hysterectomy. Bilateral ovaries are within normal limits. Other: No abdominopelvic ascites. Musculoskeletal: Degenerative changes of the visualized thoracolumbar spine. Grade 1 anterolisthesis of L4 on L5. IMPRESSION: Small bowel obstruction with transition in the right anterior abdomen. Additional ancillary findings as above. Electronically Signed   By: Charline Bills M.D.   On: 04/17/2023 02:37      Belina Mandile T. Taj Nevins [facility]  If 7PM-7AM, please contact night-coverage [email or web address] 04/17/2023, 12:16 PM

## 2023-04-17 NOTE — Progress Notes (Signed)
Patient's son, Chip Goodlow [phone]), called to ask about his Mom and what the plan was. Spoke to patient and she stated that we could give him any information regarding his care and plan for her.

## 2023-04-17 NOTE — H&P (Signed)
History and Physical    Kimberly Mcgee ZOX:[id number] DOB: 1925-10-23 DOA: 04/17/2023  PCP: Creola Corn, MD   Patient coming from: ILF  Chief Complaint: N/V, abdominal discomfort  HPI: Kimberly Mcgee is a pleasant 89 y.o. female with medical history significant for hypertension, remote hysterectomy, laparoscopic cholecystectomy in 2020, now presenting to the emergency department with abdominal discomfort, nausea, and vomiting.  Patient reports developing abdominal discomfort and bloating yesterday afternoon.  She went on to develop nausea with nonbloody vomiting.  She had a small bowel movement yesterday morning but has not been passing flatus since yesterday evening.  She denies any chest pain, cough, shortness of breath, fever, or chills.    Patient normally enjoys good health, ambulates without cane or walker, has active social life, and continues to drive.   ED Course: Upon arrival to the ED, patient is found to be afebrile and saturating well on room air with normal heart rate and elevated blood pressure.  Labs are notable for BUN 32, creatinine 1.04, glucose 332, calcium 10.4, and WBC 16,900.  CT findings are concerning for SBO.  Patient was treated with Ativan, Zofran, and fentanyl emergency department.  NG tube is being placed.  Review of Systems:  All other systems reviewed and apart from HPI, are negative.  Past Medical History:  Diagnosis Date   Arthritis    right knee   Bladder prolapse, female, acquired    Gallstones    Hypertension     Past Surgical History:  Procedure Laterality Date   ABDOMINAL HYSTERECTOMY     CHOLECYSTECTOMY N/A 08/23/2019   Procedure: LAPAROSCOPIC CHOLECYSTECTOMY;  Surgeon: Emelia Loron, MD;  Location: [facility] OR;  Service: General;  Laterality: N/A;  GENERAL WITH TAP BLOCK BILATERALLY   HEMORROIDECTOMY     x 2   KNEE SURGERY      Social History:   reports that she has never smoked. She has never used smokeless tobacco. She reports that she  does not drink alcohol and does not use drugs.  No Known Allergies  Family History  Problem Relation Age of Onset   Uterine cancer Mother    Liver disease Neg Hx    Colon cancer Neg Hx    Pancreatic cancer Neg Hx    Stomach cancer Neg Hx    Rectal cancer Neg Hx    Esophageal cancer Neg Hx      Prior to Admission medications   Medication Sig Start Date End Date Taking? Authorizing Provider  aspirin EC 81 MG tablet Take 81 mg by mouth every other day.    [provider]  Calcium Carbonate-Vitamin D (CALCIUM 600 + D PO) Take 1 tablet by mouth 2 (two) times daily.      [provider]  citalopram (CELEXA) 20 MG tablet Take 20 mg by mouth daily after breakfast.     [provider]  losartan-hydrochlorothiazide (HYZAAR) 100-25 MG per tablet Take 1 tablet by mouth daily after breakfast.  09/12/13   [provider]  Melatonin 5 MG TABS Take 5 mg by mouth at bedtime as needed (sleep).    [provider]  Misc Natural Products (COSAMIN ASU ADVANCED FORMULA PO) Take 1 tablet by mouth 2 (two) times daily.    [provider]  Multiple Vitamins-Minerals (ONE-A-DAY WOMENS 50 PLUS PO) Take 1 tablet by mouth daily.      [provider]  naproxen sodium (ALEVE) 220 MG tablet Take 440 mg by mouth as needed (pain).  [provider]  Omega-3 Fatty Acids (FISH OIL PO) Take 1 capsule by mouth 2 (two) times daily.     [provider]  polyethylene glycol powder (GLYCOLAX/MIRALAX) 17 GM/SCOOP powder Take 17 g by mouth daily as needed. Patient not taking: Reported on 07/11/2022    [provider]  Prucalopride Succinate (MOTEGRITY) 2 MG TABS TAKE 1 TABLET(2 MG) BY MOUTH DAILY 03/09/23   Zehr, Princella Pellegrini, PA-C    Physical Exam: Vitals:   04/17/23 0115 04/17/23 0118 04/17/23 0131  BP:  (!) 178/94 122/75  Pulse:  75   Resp:  14   Temp:  98.6 F (37 C)   TempSrc:  Oral   SpO2:  97%   Weight: 74.4 kg 72.6 kg   Height:  5\' 4"  (1.626 m) 5\' 1"  (1.549 m)     Constitutional: NAD, calm  Eyes: PERTLA, lids and conjunctivae normal ENMT: Mucous membranes are moist. Posterior pharynx clear of any exudate or lesions.   Neck: supple, no masses  Respiratory: no wheezing, no crackles. No accessory muscle use.  Cardiovascular: S1 & S2 heard, regular rate and rhythm. No extremity edema.   Abdomen: distended, generally tender without peritoneal signs. Bowel sounds hypoactive.  Musculoskeletal: no clubbing / cyanosis. No joint deformity upper and lower extremities.   Skin: no significant rashes, lesions, ulcers. Warm, dry, well-perfused. Neurologic: CN 2-12 grossly intact. Moving all extremities. Alert and oriented.  Psychiatric: Pleasant. Cooperative.    Labs and Imaging on Admission: I have personally reviewed following labs and imaging studies  CBC: Recent Labs  Lab 04/17/23 0122  WBC 16.9*  HGB 13.6  HCT 42.2  MCV 91.3  PLT 269   Basic Metabolic Panel: Recent Labs  Lab 04/17/23 0122  NA 134*  K 4.3  CL 98  CO2 22  GLUCOSE 332*  BUN 32*  CREATININE 1.04*  CALCIUM 10.4*   GFR: Estimated Creatinine Clearance: 29.5 mL/min (A) (by C-G formula based on SCr of 1.04 mg/dL (H)). Liver Function Tests: Recent Labs  Lab 04/17/23 0122  AST 26  ALT 18  ALKPHOS 36*  BILITOT 0.8  PROT 7.7  ALBUMIN 4.1   Recent Labs  Lab 04/17/23 0122  LIPASE 30   No results for input(s): "AMMONIA" in the last 168 hours. Coagulation Profile: No results for input(s): "INR", "PROTIME" in the last 168 hours. Cardiac Enzymes: No results for input(s): "CKTOTAL", "CKMB", "CKMBINDEX", "TROPONINI" in the last 168 hours. BNP (last 3 results) No results for input(s): "PROBNP" in the last 8760 hours. HbA1C: No results for input(s): "HGBA1C" in the last 72 hours. CBG: Recent Labs  Lab 04/17/23 0120  GLUCAP 305*   Lipid Profile: No results for input(s): "CHOL", "HDL", "LDLCALC", "TRIG", "CHOLHDL", "LDLDIRECT" in the  last 72 hours. Thyroid Function Tests: No results for input(s): "TSH", "T4TOTAL", "FREET4", "T3FREE", "THYROIDAB" in the last 72 hours. Anemia Panel: No results for input(s): "VITAMINB12", "FOLATE", "FERRITIN", "TIBC", "IRON", "RETICCTPCT" in the last 72 hours. Urine analysis:    Component Value Date/Time   COLORURINE YELLOW 04/11/2018 0223   APPEARANCEUR CLOUDY (A) 04/11/2018 0223   LABSPEC 1.026 04/11/2018 0223   PHURINE 5.0 04/11/2018 0223   GLUCOSEU 50 (A) 04/11/2018 0223   HGBUR NEGATIVE 04/11/2018 0223   BILIRUBINUR NEGATIVE 04/11/2018 0223   KETONESUR 20 (A) 04/11/2018 0223   PROTEINUR 100 (A) 04/11/2018 0223   UROBILINOGEN 0.2 09/25/2015 1211   NITRITE NEGATIVE 04/11/2018 0223   LEUKOCYTESUR MODERATE (A) 04/11/2018 0223   Sepsis Labs: @LABRCNTIP (procalcitonin:4,lacticidven:4) )No  results found for this or any previous visit (from the past 240 hour(s)).   Radiological Exams on Admission: CT ABDOMEN PELVIS W CONTRAST  Result Date: 04/17/2023 CLINICAL DATA:  Abdominal pain, nausea/vomiting EXAM: CT ABDOMEN AND PELVIS WITH CONTRAST TECHNIQUE: Multidetector CT imaging of the abdomen and pelvis was performed using the standard protocol following bolus administration of intravenous contrast. RADIATION DOSE REDUCTION: This exam was performed according to the departmental dose-optimization program which includes automated exposure control, adjustment of the mA and/or kV according to patient size and/or use of iterative reconstruction technique. CONTRAST:  80mL OMNIPAQUE IOHEXOL 300 MG/ML  SOLN COMPARISON:  07/15/2019 FINDINGS: Lower chest: Lung bases are clear. Hepatobiliary: Mildly nodular hepatic contour. Status post cholecystectomy. No intrahepatic or extrahepatic duct dilatation. Pancreas: Small pancreatic cysts measuring up to 11 mm in the pancreatic tail (series 2/image 20), unchanged from 2020. No follow-up is recommended given the patient's age. Spleen: Within normal limits.  Adrenals/Urinary Tract: Adrenal glands are within normal limits. 17 mm simple anterior left lower pole renal cyst (series 2/image 32), benign (Bosniak I). Left renal sinus cysts. No focal recommended. Right kidney is within normal limits. No hydronephrosis. Bladder is underdistended. 3 mm layering bladder calculus (series 2/image 66). Stomach/Bowel: Stomach is notable for a moderate hiatal hernia. Multiple dilated loops of small bowel in the left mid abdomen, suggesting small bowel obstruction, with transition involving a thickened loop in the right anterior abdomen (series 2/image 42). Decompressed loops in the right lower quadrant. Normal appendix (series 2/image 49). Left colonic diverticulosis, without evidence of diverticulitis. Vascular/Lymphatic: No evidence of abdominal aortic aneurysm. Atherosclerotic calcifications of the abdominal aorta and branch vessels. No suspicious abdominopelvic lymphadenopathy. Reproductive: Status post hysterectomy. Bilateral ovaries are within normal limits. Other: No abdominopelvic ascites. Musculoskeletal: Degenerative changes of the visualized thoracolumbar spine. Grade 1 anterolisthesis of L4 on L5. IMPRESSION: Small bowel obstruction with transition in the right anterior abdomen. Additional ancillary findings as above. Electronically Signed   By: Charline Bills M.D.   On: 04/17/2023 02:37     Assessment/Plan   1. SBO  - NPO, NGT decompression, IVF hydration, pain-control, consult surgery    2. Hypertension  - Treat as-needed only for now   3. Hyperglycemia  - Serum glucose 332 in ED without hx of DM  - Stress reaction vs new-onset DM  - Check A1c, check CBGs, use low-intensity SSI as needed     DVT prophylaxis: sq heparin  Code Status: Full  Level of Care: Level of care: Med-Surg Family Communication: None present Disposition Plan:  Patient is from: ILF Anticipated d/c is to: TBD Anticipated d/c date is: 04/20/23  Patient currently: pending return  of bowel function  Consults called: Message sent to general surgery with request for routine consult  Admission status: Inpatient     Briscoe Deutscher, MD [facility]  04/17/2023, 3:19 AM

## 2023-04-17 NOTE — ED Provider Notes (Signed)
[place] EMERGENCY DEPARTMENT AT [facility] Provider Note   CSN: [id number] Arrival date & time: 04/17/23  0105     History  Chief Complaint  Patient presents with   Emesis    Kimberly Mcgee is a 89 y.o. female.  The history is provided by the patient and medical records.  Emesis Associated symptoms: abdominal pain    89 year old female with history of hypertension, chronic constipation, arthritis, presenting to the ED with abdominal pain, nausea, and vomiting.  Patient reports all day yesterday she had a bloating sensation in her abdomen and feels distended.  She did have a bowel movement yesterday morning but it was small.  She has had some sporadic episodes of nausea and vomiting, this evening was mostly bile as she did not eat and drink anything yesterday.  She has not been able to pass gas but did belch a little bit.  She has not had any fever.  No urinary symptoms.  Does have history of cholecystectomy and hysterectomy.  Was told at one point that she had a lot of "scar tissue" in her abdomen.  Home Medications Prior to Admission medications   Medication Sig Start Date End Date Taking? Authorizing Provider  aspirin EC 81 MG tablet Take 81 mg by mouth every other day.    [provider]  Calcium Carbonate-Vitamin D (CALCIUM 600 + D PO) Take 1 tablet by mouth 2 (two) times daily.      [provider]  citalopram (CELEXA) 20 MG tablet Take 20 mg by mouth daily after breakfast.     [provider]  losartan-hydrochlorothiazide (HYZAAR) 100-25 MG per tablet Take 1 tablet by mouth daily after breakfast.  09/12/13   [provider]  Melatonin 5 MG TABS Take 5 mg by mouth at bedtime as needed (sleep).    [provider]  Misc Natural Products (COSAMIN ASU ADVANCED FORMULA PO) Take 1 tablet by mouth 2 (two) times daily.    [provider]  Multiple Vitamins-Minerals (ONE-A-DAY WOMENS 50 PLUS PO) Take 1 tablet by mouth  daily.      [provider]  naproxen sodium (ALEVE) 220 MG tablet Take 440 mg by mouth as needed (pain).    [provider]  Omega-3 Fatty Acids (FISH OIL PO) Take 1 capsule by mouth 2 (two) times daily.     [provider]  polyethylene glycol powder (GLYCOLAX/MIRALAX) 17 GM/SCOOP powder Take 17 g by mouth daily as needed. Patient not taking: Reported on 07/11/2022    [provider]  Prucalopride Succinate (MOTEGRITY) 2 MG TABS TAKE 1 TABLET(2 MG) BY MOUTH DAILY 03/09/23   Zehr, Princella Pellegrini, PA-C      Allergies    Patient has no known allergies.    Review of Systems   Review of Systems  Gastrointestinal:  Positive for abdominal pain, nausea and vomiting.  All other systems reviewed and are negative.   Physical Exam Updated Vital Signs BP 122/75 (BP Location: Right Arm)   Pulse 75   Temp 98.6 F (37 C) (Oral)   Resp 14   Ht  (1.549 m)   Wt 72.6 kg   SpO2 97%   BMI 30.23 kg/m   Physical Exam Vitals and nursing note reviewed.  Constitutional:      Appearance: She is well-developed.  HENT:     Head: Normocephalic and atraumatic.  Eyes:     Conjunctiva/sclera: Conjunctivae normal.     Pupils: Pupils are equal,  round, and reactive to light.  Cardiovascular:     Rate and Rhythm: Normal rate and regular rhythm.     Heart sounds: Normal heart sounds.  Pulmonary:     Effort: Pulmonary effort is normal.     Breath sounds: Normal breath sounds.  Abdominal:     General: Bowel sounds are decreased. There is distension.     Palpations: Abdomen is soft.     Comments: Abdomen appears distended, bowel sounds are decreased  Musculoskeletal:        General: Normal range of motion.     Cervical back: Normal range of motion.  Skin:    General: Skin is warm and dry.  Neurological:     Mental Status: She is alert and oriented to person, place, and time.     ED Results / Procedures / Treatments   Labs (all labs ordered are listed, but only  abnormal results are displayed) Labs Reviewed  COMPREHENSIVE METABOLIC PANEL - Abnormal; Notable for the following components:      Result Value   Sodium 134 (*)    Glucose, Bld 332 (*)    BUN 32 (*)    Creatinine, Ser 1.04 (*)    Calcium 10.4 (*)    Alkaline Phosphatase 36 (*)    GFR, Estimated 49 (*)    All other components within normal limits  CBC - Abnormal; Notable for the following components:   WBC 16.9 (*)    All other components within normal limits  CBG MONITORING, ED - Abnormal; Notable for the following components:   Glucose-Capillary 305 (*)    All other components within normal limits  LIPASE, BLOOD  URINALYSIS, ROUTINE W REFLEX MICROSCOPIC    EKG None  Radiology CT ABDOMEN PELVIS W CONTRAST  Result Date: 04/17/2023 CLINICAL DATA:  Abdominal pain, nausea/vomiting EXAM: CT ABDOMEN AND PELVIS WITH CONTRAST TECHNIQUE: Multidetector CT imaging of the abdomen and pelvis was performed using the standard protocol following bolus administration of intravenous contrast. RADIATION DOSE REDUCTION: This exam was performed according to the departmental dose-optimization program which includes automated exposure control, adjustment of the mA and/or kV according to patient size and/or use of iterative reconstruction technique. CONTRAST:  80mL OMNIPAQUE IOHEXOL 300 MG/ML  SOLN COMPARISON:  07/15/2019 FINDINGS: Lower chest: Lung bases are clear. Hepatobiliary: Mildly nodular hepatic contour. Status post cholecystectomy. No intrahepatic or extrahepatic duct dilatation. Pancreas: Small pancreatic cysts measuring up to 11 mm in the pancreatic tail (series 2/image 20), unchanged from 2020. No follow-up is recommended given the patient's age. Spleen: Within normal limits. Adrenals/Urinary Tract: Adrenal glands are within normal limits. 17 mm simple anterior left lower pole renal cyst (series 2/image 32), benign (Bosniak I). Left renal sinus cysts. No focal recommended. Right kidney is within  normal limits. No hydronephrosis. Bladder is underdistended. 3 mm layering bladder calculus (series 2/image 66). Stomach/Bowel: Stomach is notable for a moderate hiatal hernia. Multiple dilated loops of small bowel in the left mid abdomen, suggesting small bowel obstruction, with transition involving a thickened loop in the right anterior abdomen (series 2/image 42). Decompressed loops in the right lower quadrant. Normal appendix (series 2/image 49). Left colonic diverticulosis, without evidence of diverticulitis. Vascular/Lymphatic: No evidence of abdominal aortic aneurysm. Atherosclerotic calcifications of the abdominal aorta and branch vessels. No suspicious abdominopelvic lymphadenopathy. Reproductive: Status post hysterectomy. Bilateral ovaries are within normal limits. Other: No abdominopelvic ascites. Musculoskeletal: Degenerative changes of the visualized thoracolumbar spine. Grade 1 anterolisthesis of L4 on L5. IMPRESSION: Small bowel obstruction with  transition in the right anterior abdomen. Additional ancillary findings as above. Electronically Signed   By: Charline Bills M.D.   On: 04/17/2023 02:37    Procedures Procedures    Medications Ordered in ED Medications  LORazepam (ATIVAN) injection 0.5 mg (has no administration in time range)  fentaNYL (SUBLIMAZE) injection 50 mcg (50 mcg Intravenous Given 04/17/23 0151)  ondansetron (ZOFRAN) injection 4 mg (4 mg Intravenous Given 04/17/23 0151)  iohexol (OMNIPAQUE) 300 MG/ML solution 80 mL (80 mLs Intravenous Contrast Given 04/17/23 0208)    ED Course/ Medical Decision Making/ A&P                             Medical Decision Making Amount and/or Complexity of Data Reviewed Labs: ordered. Radiology: ordered and independent interpretation performed. ECG/medicine tests: ordered and independent interpretation performed.  Risk Prescription drug management. Decision regarding hospitalization.   89 y.o. F here with abdominal pain,  distention, nausea/vomiting.  Afebrile, non-toxic in appearance. Her abdomen does appear distneded on exam, bowel sounds are decreased.  Not able to pass flatus.  Clinical concern for SBO.  Labs and CT ordered.  Labs as above-- WBC count 16.9, no electrolyte derangement.  Normal lipase.  CT confirms SBO, transition point in the right mid abdomen.  UA pending. Will place NG tube, admit.  Discussed with Dr. Antionette Char-- will admit for ongoing care.  Secure message sent to general surgery for AM consult.  Final Clinical Impression(s) / ED Diagnoses Final diagnoses:  SBO (small bowel obstruction)    Rx / DC Orders ED Discharge Orders     None         Garlon Hatchet, PA-C 04/17/23 0315    Palumbo, April, MD 04/17/23 0321

## 2023-04-18 ENCOUNTER — Inpatient Hospital Stay (HOSPITAL_COMMUNITY): Payer: HMO

## 2023-04-18 DIAGNOSIS — K56609 Unspecified intestinal obstruction, unspecified as to partial versus complete obstruction: Secondary | ICD-10-CM | POA: Diagnosis not present

## 2023-04-18 LAB — LIPID PANEL
Cholesterol: 161 mg/dL (ref 0–200)
HDL: 33 mg/dL — ABNORMAL LOW (ref >40)
LDL Cholesterol: 59 mg/dL (ref 0–99)
Total CHOL/HDL Ratio: 4.9 RATIO
Triglycerides: 345 mg/dL — ABNORMAL HIGH (ref <150)
VLDL: 69 mg/dL — ABNORMAL HIGH (ref 0–40)

## 2023-04-18 LAB — CBC
HCT: 37.8 % (ref 36.0–46.0)
Hemoglobin: 12.1 g/dL (ref 12.0–15.0)
MCH: 29.8 pg (ref 26.0–34.0)
MCHC: 32 g/dL (ref 30.0–36.0)
MCV: 93.1 fL (ref 80.0–100.0)
Platelets: 215 10*3/uL (ref 150–400)
RBC: 4.06 MIL/uL (ref 3.87–5.11)
RDW: 14.4 % (ref 11.5–15.5)
WBC: 12.5 10*3/uL — ABNORMAL HIGH (ref 4.0–10.5)
nRBC: 0 % (ref 0.0–0.2)

## 2023-04-18 LAB — GLUCOSE, CAPILLARY
Glucose-Capillary: 144 mg/dL — ABNORMAL HIGH (ref 70–99)
Glucose-Capillary: 148 mg/dL — ABNORMAL HIGH (ref 70–99)
Glucose-Capillary: 157 mg/dL — ABNORMAL HIGH (ref 70–99)
Glucose-Capillary: 167 mg/dL — ABNORMAL HIGH (ref 70–99)
Glucose-Capillary: 171 mg/dL — ABNORMAL HIGH (ref 70–99)
Glucose-Capillary: 179 mg/dL — ABNORMAL HIGH (ref 70–99)
Glucose-Capillary: 184 mg/dL — ABNORMAL HIGH (ref 70–99)

## 2023-04-18 LAB — BASIC METABOLIC PANEL
Anion gap: 10 (ref 5–15)
BUN: 21 mg/dL (ref 8–23)
CO2: 24 mmol/L (ref 22–32)
Calcium: 8.9 mg/dL (ref 8.9–10.3)
Chloride: 103 mmol/L (ref 98–111)
Creatinine, Ser: 0.76 mg/dL (ref 0.44–1.00)
GFR, Estimated: >60 mL/min (ref >60)
Glucose, Bld: 185 mg/dL — ABNORMAL HIGH (ref 70–99)
Potassium: 3.1 mmol/L — ABNORMAL LOW (ref 3.5–5.1)
Sodium: 137 mmol/L (ref 135–145)

## 2023-04-18 LAB — MAGNESIUM: Magnesium: 2.1 mg/dL (ref 1.7–2.4)

## 2023-04-18 MED ORDER — POTASSIUM CHLORIDE 10 MEQ/100ML IV SOLN
10.0000 meq | INTRAVENOUS | Status: AC
  Administered 2023-04-18 (×4): 10 meq via INTRAVENOUS
  Filled 2023-04-18 (×4): qty 100

## 2023-04-18 MED ORDER — LOSARTAN POTASSIUM-HCTZ 100-25 MG PO TABS: 1.0000 | ORAL_TABLET | Freq: Every day | ORAL | Status: DC

## 2023-04-18 MED ORDER — LOSARTAN POTASSIUM 50 MG PO TABS
100.0000 mg | ORAL_TABLET | Freq: Every day | ORAL | Status: DC
  Administered 2023-04-19: 100 mg via ORAL
  Filled 2023-04-18: qty 2

## 2023-04-18 MED ORDER — HYDROCHLOROTHIAZIDE 25 MG PO TABS
25.0000 mg | ORAL_TABLET | Freq: Every day | ORAL | Status: DC
  Administered 2023-04-19: 25 mg via ORAL
  Filled 2023-04-18: qty 1

## 2023-04-18 MED ORDER — CITALOPRAM HYDROBROMIDE 20 MG PO TABS
20.0000 mg | ORAL_TABLET | Freq: Every day | ORAL | Status: DC
  Administered 2023-04-19: 20 mg via ORAL
  Filled 2023-04-18: qty 1

## 2023-04-18 NOTE — Progress Notes (Signed)
Subjective/Chief Complaint: Pt doing well KUB reviewed   Objective: Vital signs in last 24 hours: Temp:  [97.7 F (36.5 C)-98 F (36.7 C)] 97.8 F (36.6 C) (04/20 0518) Pulse Rate:  [62-69] 62 (04/20 0518) Resp:  [18-20] 18 (04/20 0518) BP: (137-174)/(73-75) 151/73 (04/20 0518) SpO2:  [95 %-98 %] 95 % (04/20 0518) Last BM Date : 04/16/23  Intake/Output from previous day: 04/19 0701 - 04/20 0700 In: 441.4 [I.V.:441.4] Out: -  Intake/Output this shift: No intake/output data recorded.  PE:  Constitutional: No acute distress, conversant, appears states age. Eyes: Anicteric sclerae, moist conjunctiva, no lid lag Lungs: Clear to auscultation bilaterally, normal respiratory effort CV: regular rate and rhythm, no murmurs, no peripheral edema, pedal pulses 2+ GI: Soft, no masses or hepatosplenomegaly, non-tender to palpation Skin: No rashes, palpation reveals normal turgor Psychiatric: appropriate judgment and insight, oriented to person, place, and time   Lab Results:  Recent Labs    04/17/23 0552 04/18/23 0412  WBC 14.7* 12.5*  HGB 13.0 12.1  HCT 39.7 37.8  PLT 244 215   BMET Recent Labs    04/17/23 0552 04/18/23 0412  NA 132* 137  K 4.0 3.1*  CL 97* 103  CO2 23 24  GLUCOSE 278* 185*  BUN 28* 21  CREATININE 1.01* 0.76  CALCIUM 10.0 8.9   PT/INR No results for input(s): "LABPROT", "INR" in the last 72 hours. ABG No results for input(s): "PHART", "HCO3" in the last 72 hours.  Invalid input(s): "PCO2", "PO2"  Studies/Results: DG Abd Portable 1V-Small Bowel Obstruction Protocol-initial, 8 hr delay  Result Date: 04/18/2023 CLINICAL DATA:  Small-bowel obstruction, 8 hour delayed examination EXAM: PORTABLE ABDOMEN - 1 VIEW COMPARISON:  None Available. FINDINGS: Nasogastric tube tip overlies the expected distal body of the stomach. Normal abdominal gas pattern. Administered oral contrast opacifies a nondistended colon. No gross free intraperitoneal gas.  Cholecystectomy clips seen in the right upper quadrant. IMPRESSION: 1. Normal abdominal gas pattern. Administered oral contrast now within the colon which appears decompressed. Electronically Signed   By: Helyn Numbers M.D.   On: 04/18/2023 01:05   DG Basil Dess Tube Plc W/Fl W/Rad  Result Date: 04/17/2023 CLINICAL DATA:  Small bowel obstruction EXAM: NASO G TUBE PLACEMENT WITH FL AND WITH RAD FLUOROSCOPY: Radiation Exposure Index (if provided by the fluoroscopic device): 77.3 mGy air kerma COMPARISON:  CT abdomen 04/17/2023 FINDINGS: Following anesthetizing the right nostril with Cetacaine spray and viscous lidocaine, a nasogastric tube was advanced into the stomach under fluoroscopic observation. Initially the tube was caught up in a moderate-sized hiatal hernia and was surprisingly reticent to take the anterior turn into the intraabdominal portion of the stomach. Contrast injected into the hiatal hernia through the tube tended to reflux back up the esophagus rather extend distally into the intra-abdominal portion of the stomach; I manually suctioned most of this contrast back out. With various manipulations including use of an Amplatz super stiff wire, I was able to advance the nasogastric tube into the stomach body. The tube was taped in place with side port and distal tip in the stomach body. The nasogastric tube is ready to use. IMPRESSION: 1. Successful placement of a nasogastric tube into the stomach body. The tube is ready to use. 2. Moderate-sized hiatal hernia. Electronically Signed   By: Gaylyn Rong M.D.   On: 04/17/2023 15:03   DG Abdomen 1 View  Addendum Date: 04/17/2023   ADDENDUM REPORT: 04/17/2023 04:31 ADDENDUM: Study discussed by telephone with Dr. Marcial Pacas  OPYD on 04/17/2023 at 0417 hours. Electronically Signed   By: Odessa Fleming M.D.   On: 04/17/2023 04:31   Result Date: 04/17/2023 CLINICAL DATA:  89 year old female NG tube placement. EXAM: ABDOMEN - 1 VIEW COMPARISON:  CT Abdomen and  Pelvis 0210 hours today. FINDINGS: AP view at 0308 hours. Enteric tube is looped at the level of the central mediastinum. The tube tip does continue distally to the level of the hiatal hernia partially visible on CT today. Lung bases appear clear. Bowel-gas pattern not significantly changed. Stable cholecystectomy clips. Stable visualized osseous structures. IMPRESSION: 1. Abnormally looped enteric tube at the level of the upper/central mediastinum. This should be removed and repositioned. 2. Chronic gastric hiatal hernia partially visible on CT this morning. Electronically Signed: By: Odessa Fleming M.D. On: 04/17/2023 04:14   CT ABDOMEN PELVIS W CONTRAST  Result Date: 04/17/2023 CLINICAL DATA:  Abdominal pain, nausea/vomiting EXAM: CT ABDOMEN AND PELVIS WITH CONTRAST TECHNIQUE: Multidetector CT imaging of the abdomen and pelvis was performed using the standard protocol following bolus administration of intravenous contrast. RADIATION DOSE REDUCTION: This exam was performed according to the departmental dose-optimization program which includes automated exposure control, adjustment of the mA and/or kV according to patient size and/or use of iterative reconstruction technique. CONTRAST:  80mL OMNIPAQUE IOHEXOL 300 MG/ML  SOLN COMPARISON:  07/15/2019 FINDINGS: Lower chest: Lung bases are clear. Hepatobiliary: Mildly nodular hepatic contour. Status post cholecystectomy. No intrahepatic or extrahepatic duct dilatation. Pancreas: Small pancreatic cysts measuring up to 11 mm in the pancreatic tail (series 2/image 20), unchanged from 2020. No follow-up is recommended given the patient's age. Spleen: Within normal limits. Adrenals/Urinary Tract: Adrenal glands are within normal limits. 17 mm simple anterior left lower pole renal cyst (series 2/image 32), benign (Bosniak I). Left renal sinus cysts. No focal recommended. Right kidney is within normal limits. No hydronephrosis. Bladder is underdistended. 3 mm layering bladder  calculus (series 2/image 66). Stomach/Bowel: Stomach is notable for a moderate hiatal hernia. Multiple dilated loops of small bowel in the left mid abdomen, suggesting small bowel obstruction, with transition involving a thickened loop in the right anterior abdomen (series 2/image 42). Decompressed loops in the right lower quadrant. Normal appendix (series 2/image 49). Left colonic diverticulosis, without evidence of diverticulitis. Vascular/Lymphatic: No evidence of abdominal aortic aneurysm. Atherosclerotic calcifications of the abdominal aorta and branch vessels. No suspicious abdominopelvic lymphadenopathy. Reproductive: Status post hysterectomy. Bilateral ovaries are within normal limits. Other: No abdominopelvic ascites. Musculoskeletal: Degenerative changes of the visualized thoracolumbar spine. Grade 1 anterolisthesis of L4 on L5. IMPRESSION: Small bowel obstruction with transition in the right anterior abdomen. Additional ancillary findings as above. Electronically Signed   By: Charline Bills M.D.   On: 04/17/2023 02:37    Anti-infectives: Anti-infectives (From admission, onward)    None       Assessment/Plan: SBO - appears resolved on KUB DC NGT and start clears OK to restart home rxs - Keep K > 4 and Mg > 2 for bowel function - Mobilize for bowel function - We will follow with you.    FEN - CLD, IVF per [facility] VTE - SCDs, subq heparin ID - None  LOS: 1 day    Axel Filler 04/18/2023

## 2023-04-18 NOTE — Progress Notes (Signed)
PROGRESS NOTE    Kimberly Mcgee  ZOX:[id number] DOB: 1925-01-28 DOA: 04/17/2023 PCP: Creola Corn, MD   Brief Narrative: 89 year old F with PMH of HTN, hysterectomy and cholecystectomy presenting with nausea, vomiting, abdominal discomfort and bloating for 1 to 2 days and found to have small bowel obstruction and hyperglycemia.  GI consulted and recommended NG tube decompression which was unsuccessful at bedside.  IR consulted for NG tube.   Assessment & Plan:   Principal Problem:   SBO (small bowel obstruction) Active Problems:   Hypertension   Hyperglycemia   Small bowel obstruction: Present with nausea, vomiting and abdominal discomfort.  Prior history of hysterectomy and cholecystectomy.  No further emesis breath nausea and abdominal distention.   -General surgery recommended NG tube decompression.  -NGT placement  by  IR 4/19 -ngt dc 4/20 CLD 4/20   New onset diabetes with hyperglycemia: A1c 8.4%. Last Labs  CBG (last 3)  Recent Labs    04/18/23 0001 04/18/23 0502 04/18/23 0729  GLUCAP 167* 184* 179*     CKD-3A: Recent Labs (within last 365 days)      Recent Labs    04/17/23 0122 04/17/23 0552  BUN 32* 28*  CREATININE 1.04* 1.01*    STABLE CR 0.76 Hypertension: BP elevated.Restart losartan hctz   Leukocytosis resolving  No evidence of infection   Abnormal urinalysis: UA with nitrites.  She denies UTI symptoms.   Advance care planning: She does not want CPR or intubation.  -CODE STATUS changed to DNR/DNI.    Obesity Body mass index is 30.23 kg/m.     DVT prophylaxis:  heparin injection 5,000 Units Start: 04/17/23 0600   Code Status: DNR/DNI Family Communication: None at bedside Level of care: Med-Surg Status is: Inpatient Remains inpatient appropriate because: Small bowel obstruction     Final disposition: TBD Consultants:  General surgery IR    Estimated body mass index is 30.23 kg/m as calculated from the following:   Height as of this  encounter:  (1.549 m).   Weight as of this encounter: 72.6 kg.  DVT prophylaxis: heparin Code Status: dnr Family Communication: none Disposition Plan:  Status is: Inpatient Remains inpatient appropriate because: sbo   Consultants:  surgery  Procedures: ngt Antimicrobials: none  Subjective: Resting in bed in nad Ngt in place No drainage noted in ngt  Objective: Vitals:   04/17/23 0446 04/17/23 1231 04/17/23 2133 04/18/23 0518  BP: (!) 174/81 (!) 174/74 137/75 (!) 151/73  Pulse: 72 69 65 62  Resp: Temp: 97.8 F (36.6 C) 97.7 F (36.5 C) 98 F (36.7 C) 97.8 F (36.6 C)  TempSrc: Oral     SpO2: 95% 95% 98% 95%  Weight:      Height:        Intake/Output Summary (Last 24 hours) at 04/18/2023 1129 Last data filed at 04/17/2023 1706 Gross per 24 hour  Intake 441.41 ml  Output --  Net 441.41 ml   Filed Weights   04/17/23 0115 04/17/23 0118  Weight: 74.4 kg 72.6 kg    Examination:  General exam: Appears calm and comfortable  Respiratory system: Clear to auscultation. Respiratory effort normal. Cardiovascular system: S1 & S2 heard, RRR. No JVD, murmurs, rubs, gallops or clicks. No pedal edema. Gastrointestinal system: Abdomen is nondistended, soft and nontender. No organomegaly or masses felt. Normal bowel sounds heard. Central nervous system: Alert and oriented. No focal neurological deficits. Extremities:  no edema Skin: No rashes, lesions or ulcers Psychiatry:  Judgement and insight appear normal. Mood & affect appropriate.     Data Reviewed: I have personally reviewed following labs and imaging studies  CBC: Recent Labs  Lab 04/17/23 0122 04/17/23 0552 04/18/23 0412  WBC 16.9* 14.7* 12.5*  HGB 13.6 13.0 12.1  HCT 42.2 39.7 37.8  MCV 91.3 91.5 93.1  PLT 269 244 215   Basic Metabolic Panel: Recent Labs  Lab 04/17/23 0122 04/17/23 0552 04/18/23 0412  NA 134* 132* 137  K 4.3 4.0 3.1*  CL 98 97* 103  CO2 22 23 24   GLUCOSE 332*  278* 185*  BUN 32* 28* 21  CREATININE 1.04* 1.01* 0.76  CALCIUM 10.4* 10.0 8.9  MG  --  2.2 2.1   GFR: Estimated Creatinine Clearance: 38.3 mL/min (by C-G formula based on SCr of 0.76 mg/dL). Liver Function Tests: Recent Labs  Lab 04/17/23 0122  AST 26  ALT 18  ALKPHOS 36*  BILITOT 0.8  PROT 7.7  ALBUMIN 4.1   Recent Labs  Lab 04/17/23 0122  LIPASE 30   No results for input(s): "AMMONIA" in the last 168 hours. Coagulation Profile: No results for input(s): "INR", "PROTIME" in the last 168 hours. Cardiac Enzymes: No results for input(s): "CKTOTAL", "CKMB", "CKMBINDEX", "TROPONINI" in the last 168 hours. BNP (last 3 results) No results for input(s): "PROBNP" in the last 8760 hours. HbA1C: Recent Labs    04/17/23 0552  HGBA1C 8.4*   CBG: Recent Labs  Lab 04/17/23 1642 04/17/23 2033 04/18/23 0001 04/18/23 0502 04/18/23 0729  GLUCAP 205* 179* 167* 184* 179*   Lipid Profile: Recent Labs    04/18/23 0412  CHOL 161  HDL 33*  LDLCALC 59  TRIG 161*  CHOLHDL 4.9   Thyroid Function Tests: No results for input(s): "TSH", "T4TOTAL", "FREET4", "T3FREE", "THYROIDAB" in the last 72 hours. Anemia Panel: No results for input(s): "VITAMINB12", "FOLATE", "FERRITIN", "TIBC", "IRON", "RETICCTPCT" in the last 72 hours. Sepsis Labs: No results for input(s): "PROCALCITON", "LATICACIDVEN" in the last 168 hours.  No results found for this or any previous visit (from the past 240 hour(s)).       Radiology Studies: DG Abd Portable 1V-Small Bowel Obstruction Protocol-initial, 8 hr delay  Result Date: 04/18/2023 CLINICAL DATA:  Small-bowel obstruction, 8 hour delayed examination EXAM: PORTABLE ABDOMEN - 1 VIEW COMPARISON:  None Available. FINDINGS: Nasogastric tube tip overlies the expected distal body of the stomach. Normal abdominal gas pattern. Administered oral contrast opacifies a nondistended colon. No gross free intraperitoneal gas. Cholecystectomy clips seen in the  right upper quadrant. IMPRESSION: 1. Normal abdominal gas pattern. Administered oral contrast now within the colon which appears decompressed. Electronically Signed   By: Helyn Numbers M.D.   On: 04/18/2023 01:05   DG Basil Dess Tube Plc W/Fl W/Rad  Result Date: 04/17/2023 CLINICAL DATA:  Small bowel obstruction EXAM: NASO G TUBE PLACEMENT WITH FL AND WITH RAD FLUOROSCOPY: Radiation Exposure Index (if provided by the fluoroscopic device): 77.3 mGy air kerma COMPARISON:  CT abdomen 04/17/2023 FINDINGS: Following anesthetizing the right nostril with Cetacaine spray and viscous lidocaine, a nasogastric tube was advanced into the stomach under fluoroscopic observation. Initially the tube was caught up in a moderate-sized hiatal hernia and was surprisingly reticent to take the anterior turn into the intraabdominal portion of the stomach. Contrast injected into the hiatal hernia through the tube tended to reflux back up the esophagus rather extend distally into the intra-abdominal portion of the stomach; I manually suctioned most of this contrast back out. With  various manipulations including use of an Amplatz super stiff wire, I was able to advance the nasogastric tube into the stomach body. The tube was taped in place with side port and distal tip in the stomach body. The nasogastric tube is ready to use. IMPRESSION: 1. Successful placement of a nasogastric tube into the stomach body. The tube is ready to use. 2. Moderate-sized hiatal hernia. Electronically Signed   By: Gaylyn Rong M.D.   On: 04/17/2023 15:03   DG Abdomen 1 View  Addendum Date: 04/17/2023   ADDENDUM REPORT: 04/17/2023 04:31 ADDENDUM: Study discussed by telephone with Dr. Marcial Pacas OPYD on 04/17/2023 at 0417 hours. Electronically Signed   By: Odessa Fleming M.D.   On: 04/17/2023 04:31   Result Date: 04/17/2023 CLINICAL DATA:  89 year old female NG tube placement. EXAM: ABDOMEN - 1 VIEW COMPARISON:  CT Abdomen and Pelvis 0210 hours today. FINDINGS: AP  view at 0308 hours. Enteric tube is looped at the level of the central mediastinum. The tube tip does continue distally to the level of the hiatal hernia partially visible on CT today. Lung bases appear clear. Bowel-gas pattern not significantly changed. Stable cholecystectomy clips. Stable visualized osseous structures. IMPRESSION: 1. Abnormally looped enteric tube at the level of the upper/central mediastinum. This should be removed and repositioned. 2. Chronic gastric hiatal hernia partially visible on CT this morning. Electronically Signed: By: Odessa Fleming M.D. On: 04/17/2023 04:14   CT ABDOMEN PELVIS W CONTRAST  Result Date: 04/17/2023 CLINICAL DATA:  Abdominal pain, nausea/vomiting EXAM: CT ABDOMEN AND PELVIS WITH CONTRAST TECHNIQUE: Multidetector CT imaging of the abdomen and pelvis was performed using the standard protocol following bolus administration of intravenous contrast. RADIATION DOSE REDUCTION: This exam was performed according to the departmental dose-optimization program which includes automated exposure control, adjustment of the mA and/or kV according to patient size and/or use of iterative reconstruction technique. CONTRAST:  80mL OMNIPAQUE IOHEXOL 300 MG/ML  SOLN COMPARISON:  07/15/2019 FINDINGS: Lower chest: Lung bases are clear. Hepatobiliary: Mildly nodular hepatic contour. Status post cholecystectomy. No intrahepatic or extrahepatic duct dilatation. Pancreas: Small pancreatic cysts measuring up to 11 mm in the pancreatic tail (series 2/image 20), unchanged from 2020. No follow-up is recommended given the patient's age. Spleen: Within normal limits. Adrenals/Urinary Tract: Adrenal glands are within normal limits. 17 mm simple anterior left lower pole renal cyst (series 2/image 32), benign (Bosniak I). Left renal sinus cysts. No focal recommended. Right kidney is within normal limits. No hydronephrosis. Bladder is underdistended. 3 mm layering bladder calculus (series 2/image 66).  Stomach/Bowel: Stomach is notable for a moderate hiatal hernia. Multiple dilated loops of small bowel in the left mid abdomen, suggesting small bowel obstruction, with transition involving a thickened loop in the right anterior abdomen (series 2/image 42). Decompressed loops in the right lower quadrant. Normal appendix (series 2/image 49). Left colonic diverticulosis, without evidence of diverticulitis. Vascular/Lymphatic: No evidence of abdominal aortic aneurysm. Atherosclerotic calcifications of the abdominal aorta and branch vessels. No suspicious abdominopelvic lymphadenopathy. Reproductive: Status post hysterectomy. Bilateral ovaries are within normal limits. Other: No abdominopelvic ascites. Musculoskeletal: Degenerative changes of the visualized thoracolumbar spine. Grade 1 anterolisthesis of L4 on L5. IMPRESSION: Small bowel obstruction with transition in the right anterior abdomen. Additional ancillary findings as above. Electronically Signed   By: Charline Bills M.D.   On: 04/17/2023 02:37    Scheduled Meds:  Benzocaine   Mouth/Throat Once   heparin  5,000 Units Subcutaneous Q8H   insulin aspart  0-6 Units Subcutaneous  Q4H   Continuous Infusions:  sodium chloride 75 mL/hr at 04/17/23 1706     LOS: 1 day   Time spent: 39 min  Alwyn Ren, MD 04/18/2023, 11:29 AM

## 2023-04-18 NOTE — Progress Notes (Signed)
Mobility Specialist - Progress Note   04/18/23 1140  Mobility  Activity Ambulated with assistance in hallway  Level of Assistance Modified independent, requires aide device or extra time  Assistive Device Other (Comment) (hand held assist)  Distance Ambulated (ft) 350 ft  Activity Response Tolerated well  Mobility Referral Yes  $Mobility charge 1 Mobility   Pt received in bed and agreed to mobility. Had no issues throughout session, pt returned to bed with all needs met.  Marilynne Halsted Mobility Specialist

## 2023-04-19 DIAGNOSIS — K56609 Unspecified intestinal obstruction, unspecified as to partial versus complete obstruction: Secondary | ICD-10-CM | POA: Diagnosis not present

## 2023-04-19 LAB — CBC
HCT: 37.3 % (ref 36.0–46.0)
Hemoglobin: 11.4 g/dL — ABNORMAL LOW (ref 12.0–15.0)
MCH: 29.8 pg (ref 26.0–34.0)
MCHC: 30.6 g/dL (ref 30.0–36.0)
MCV: 97.4 fL (ref 80.0–100.0)
Platelets: 188 10*3/uL (ref 150–400)
RBC: 3.83 MIL/uL — ABNORMAL LOW (ref 3.87–5.11)
RDW: 14.4 % (ref 11.5–15.5)
WBC: 10 10*3/uL (ref 4.0–10.5)
nRBC: 0 % (ref 0.0–0.2)

## 2023-04-19 LAB — BASIC METABOLIC PANEL
Anion gap: 9 (ref 5–15)
BUN: 11 mg/dL (ref 8–23)
CO2: 16 mmol/L — ABNORMAL LOW (ref 22–32)
Calcium: 8.2 mg/dL — ABNORMAL LOW (ref 8.9–10.3)
Chloride: 110 mmol/L (ref 98–111)
Creatinine, Ser: 0.76 mg/dL (ref 0.44–1.00)
GFR, Estimated: >60 mL/min (ref >60)
Glucose, Bld: 160 mg/dL — ABNORMAL HIGH (ref 70–99)
Potassium: 3.8 mmol/L (ref 3.5–5.1)
Sodium: 135 mmol/L (ref 135–145)

## 2023-04-19 LAB — GLUCOSE, CAPILLARY
Glucose-Capillary: 149 mg/dL — ABNORMAL HIGH (ref 70–99)
Glucose-Capillary: 168 mg/dL — ABNORMAL HIGH (ref 70–99)
Glucose-Capillary: 197 mg/dL — ABNORMAL HIGH (ref 70–99)

## 2023-04-19 MED ORDER — ONDANSETRON HCL 4 MG PO TABS: 4.0000 mg | ORAL_TABLET | Freq: Four times a day (QID) | ORAL | 0 refills | Status: AC | PRN

## 2023-04-19 NOTE — Progress Notes (Signed)
Discharge AVS discussed with patient and her children. IV removed. Diet discussed. Opportunity for questions given, all questions answered.   Smiley Houseman, RN

## 2023-04-19 NOTE — Discharge Summary (Addendum)
Physician Discharge Summary  Kimberly Mcgee UXL:[id number] DOB: May 26, 1925 DOA: 04/17/2023  PCP: Creola Corn, MD  Admit date: 04/17/2023 Discharge date: 04/19/2023  Admitted From: [facility] Disposition: ilf Recommendations for Outpatient Follow-up:  Follow up with PCP in 1-2 weeks Please obtain BMP/CBC in one week Dr. Timothy Lasso please follow-up on her hemoglobin A1c and triglycerides.  A1c was 8.4 in the hospital and triglycerides were 345.  Please recheck them in few weeks   Home Health: None Equipment/Devices: None  Discharge Condition: Stable CODE STATUS: DNR Diet recommendation: Regular Brief/Interim Summary:89 year old F with PMH of HTN, hysterectomy and cholecystectomy presenting with nausea, vomiting, abdominal discomfort and bloating for 1 to 2 days and found to have small bowel obstruction and hyperglycemia. GI consulted and recommended NG tube decompression which was unsuccessful at bedside. IR was consulted for NG tube and we are able to place an NG tube  Discharge Diagnoses:  Principal Problem:   SBO (small bowel obstruction) Active Problems:   Hypertension   Hyperglycemia     Small bowel obstruction: Present with nausea, vomiting and abdominal discomfort.  She has prior history of hysterectomy and cholecystectomy.  And IBS with constipation.  She was treated with n.p.o., IV fluids, NG tube decompression.  Her symptoms were resolved with the above treatments.  Her NG tube was taken out after 24 hours.  She was able to tolerate a soft diet prior to discharge.  She was seen by general surgery.  She ambulated in the hallway prior to discharge.  She was advised to walk with help to improve her bowel functions.    New onset diabetes with hyperglycemia: A1c 8.4%.  She was not started on any medications during this hospital stay.  She was asked to follow-up with PCP and recheck her labs again .  Hypertriglyceridemia triglyceride level 345 she will follow-up with her primary  Hypertension:  Continue losartan and hydrochlorothiazide  Leukocytosis resolved No evidence of infection   Abnormal urinalysis: UA with nitrites.  She denies UTI symptoms.  She was not treated with antibiotics   Advance care planning: She does not want CPR or intubation.  -CODE STATUS changed to DNR/DNI.    Obesity Body mass index is 30.23 kg/m.   Estimated body mass index is 30.23 kg/m as calculated from the following:   Height as of this encounter: 5\' 1"  (1.549 m).   Weight as of this encounter: 72.6 kg.  Discharge Instructions  Discharge Instructions     Diet - low sodium heart healthy   Complete by: As directed    Increase activity slowly   Complete by: As directed    No wound care   Complete by: As directed       Allergies as of 04/19/2023   No Known Allergies      Medication List     TAKE these medications    acetaminophen 650 MG CR tablet Commonly known as: TYLENOL Take 650 mg by mouth every 8 (eight) hours as needed for pain.   CALCIUM 600 + D PO Take 1 tablet by mouth 2 (two) times daily.   citalopram 20 MG tablet Commonly known as: CELEXA Take 20 mg by mouth daily after breakfast.   losartan-hydrochlorothiazide 100-25 MG tablet Commonly known as: HYZAAR Take 1 tablet by mouth daily after breakfast.   Motegrity 2 MG Tabs Generic drug: Prucalopride Succinate TAKE 1 TABLET(2 MG) BY MOUTH DAILY What changed:  how much to take how to take this when to take this   ondansetron  4 MG tablet Commonly known as: ZOFRAN Take 1 tablet (4 mg total) by mouth every 6 (six) hours as needed for nausea.   ONE-A-DAY WOMENS 50 PLUS PO Take 1 tablet by mouth daily.   polyethylene glycol powder 17 GM/SCOOP powder Commonly known as: GLYCOLAX/MIRALAX Take 17 g by mouth daily.        No Known Allergies  Consultations: Surgery   Procedures/Studies: DG Abd Portable 1V-Small Bowel Obstruction Protocol-initial, 8 hr delay  Result Date: 04/18/2023 CLINICAL DATA:   Small-bowel obstruction, 8 hour delayed examination EXAM: PORTABLE ABDOMEN - 1 VIEW COMPARISON:  None Available. FINDINGS: Nasogastric tube tip overlies the expected distal body of the stomach. Normal abdominal gas pattern. Administered oral contrast opacifies a nondistended colon. No gross free intraperitoneal gas. Cholecystectomy clips seen in the right upper quadrant. IMPRESSION: 1. Normal abdominal gas pattern. Administered oral contrast now within the colon which appears decompressed. Electronically Signed   By: Helyn Numbers M.D.   On: 04/18/2023 01:05   DG Basil Dess Tube Plc W/Fl W/Rad  Result Date: 04/17/2023 CLINICAL DATA:  Small bowel obstruction EXAM: NASO G TUBE PLACEMENT WITH FL AND WITH RAD FLUOROSCOPY: Radiation Exposure Index (if provided by the fluoroscopic device): 77.3 mGy air kerma COMPARISON:  CT abdomen 04/17/2023 FINDINGS: Following anesthetizing the right nostril with Cetacaine spray and viscous lidocaine, a nasogastric tube was advanced into the stomach under fluoroscopic observation. Initially the tube was caught up in a moderate-sized hiatal hernia and was surprisingly reticent to take the anterior turn into the intraabdominal portion of the stomach. Contrast injected into the hiatal hernia through the tube tended to reflux back up the esophagus rather extend distally into the intra-abdominal portion of the stomach; I manually suctioned most of this contrast back out. With various manipulations including use of an Amplatz super stiff wire, I was able to advance the nasogastric tube into the stomach body. The tube was taped in place with side port and distal tip in the stomach body. The nasogastric tube is ready to use. IMPRESSION: 1. Successful placement of a nasogastric tube into the stomach body. The tube is ready to use. 2. Moderate-sized hiatal hernia. Electronically Signed   By: Gaylyn Rong M.D.   On: 04/17/2023 15:03   DG Abdomen 1 View  Addendum Date: 04/17/2023    ADDENDUM REPORT: 04/17/2023 04:31 ADDENDUM: Study discussed by telephone with Dr. Marcial Pacas OPYD on 04/17/2023 at 0417 hours. Electronically Signed   By: Odessa Fleming M.D.   On: 04/17/2023 04:31   Result Date: 04/17/2023 CLINICAL DATA:  89 year old female NG tube placement. EXAM: ABDOMEN - 1 VIEW COMPARISON:  CT Abdomen and Pelvis 0210 hours today. FINDINGS: AP view at 0308 hours. Enteric tube is looped at the level of the central mediastinum. The tube tip does continue distally to the level of the hiatal hernia partially visible on CT today. Lung bases appear clear. Bowel-gas pattern not significantly changed. Stable cholecystectomy clips. Stable visualized osseous structures. IMPRESSION: 1. Abnormally looped enteric tube at the level of the upper/central mediastinum. This should be removed and repositioned. 2. Chronic gastric hiatal hernia partially visible on CT this morning. Electronically Signed: By: Odessa Fleming M.D. On: 04/17/2023 04:14   CT ABDOMEN PELVIS W CONTRAST  Result Date: 04/17/2023 CLINICAL DATA:  Abdominal pain, nausea/vomiting EXAM: CT ABDOMEN AND PELVIS WITH CONTRAST TECHNIQUE: Multidetector CT imaging of the abdomen and pelvis was performed using the standard protocol following bolus administration of intravenous contrast. RADIATION DOSE REDUCTION: This exam was performed  according to the departmental dose-optimization program which includes automated exposure control, adjustment of the mA and/or kV according to patient size and/or use of iterative reconstruction technique. CONTRAST:  80mL OMNIPAQUE IOHEXOL 300 MG/ML  SOLN COMPARISON:  07/15/2019 FINDINGS: Lower chest: Lung bases are clear. Hepatobiliary: Mildly nodular hepatic contour. Status post cholecystectomy. No intrahepatic or extrahepatic duct dilatation. Pancreas: Small pancreatic cysts measuring up to 11 mm in the pancreatic tail (series 2/image 20), unchanged from 2020. No follow-up is recommended given the patient's age. Spleen: Within  normal limits. Adrenals/Urinary Tract: Adrenal glands are within normal limits. 17 mm simple anterior left lower pole renal cyst (series 2/image 32), benign (Bosniak I). Left renal sinus cysts. No focal recommended. Right kidney is within normal limits. No hydronephrosis. Bladder is underdistended. 3 mm layering bladder calculus (series 2/image 66). Stomach/Bowel: Stomach is notable for a moderate hiatal hernia. Multiple dilated loops of small bowel in the left mid abdomen, suggesting small bowel obstruction, with transition involving a thickened loop in the right anterior abdomen (series 2/image 42). Decompressed loops in the right lower quadrant. Normal appendix (series 2/image 49). Left colonic diverticulosis, without evidence of diverticulitis. Vascular/Lymphatic: No evidence of abdominal aortic aneurysm. Atherosclerotic calcifications of the abdominal aorta and branch vessels. No suspicious abdominopelvic lymphadenopathy. Reproductive: Status post hysterectomy. Bilateral ovaries are within normal limits. Other: No abdominopelvic ascites. Musculoskeletal: Degenerative changes of the visualized thoracolumbar spine. Grade 1 anterolisthesis of L4 on L5. IMPRESSION: Small bowel obstruction with transition in the right anterior abdomen. Additional ancillary findings as above. Electronically Signed   By: Charline Bills M.D.   On: 04/17/2023 02:37   (Echo, Carotid, EGD, Colonoscopy, ERCP)    Subjective:   Discharge Exam: Vitals:   04/18/23 2007 04/19/23 0437  BP: 124/68 123/61  Pulse: 62 (!) 56  Resp: 20 18  Temp: 98.8 F (37.1 C) 97.8 F (36.6 C)  SpO2: 100% 99%   Vitals:   04/18/23 0518 04/18/23 1248 04/18/23 2007 04/19/23 0437  BP: (!) 151/73 125/68 124/68 123/61  Pulse: 62 (!) 59 62 (!) 56  Resp: 18 18 20 18   Temp: 97.8 F (36.6 C) 97.9 F (36.6 C) 98.8 F (37.1 C) 97.8 F (36.6 C)  TempSrc:  Oral Oral Oral  SpO2: 95% 99% 100% 99%  Weight:      Height:        General: Pt is  alert, awake, not in acute distress Cardiovascular: RRR, S1/S2 +, no rubs, no gallops Respiratory: CTA bilaterally, no wheezing, no rhonchi Abdominal: Soft, NT, ND, bowel sounds + Extremities: no edema, no cyanosis    The results of significant diagnostics from this hospitalization (including imaging, microbiology, ancillary and laboratory) are listed below for reference.     Microbiology: No results found for this or any previous visit (from the past 240 hour(s)).   Labs: BNP (last 3 results) No results for input(s): "BNP" in the last 8760 hours. Basic Metabolic Panel: Recent Labs  Lab 04/17/23 0122 04/17/23 0552 04/18/23 0412 04/19/23 0330  NA 134* 132* 137 135  K 4.3 4.0 3.1* 3.8  CL 98 97* 103 110  CO2 22 23 24  16*  GLUCOSE 332* 278* 185* 160*  BUN 32* 28* 21 11  CREATININE 1.04* 1.01* 0.76 0.76  CALCIUM 10.4* 10.0 8.9 8.2*  MG  --  2.2 2.1  --    Liver Function Tests: Recent Labs  Lab 04/17/23 0122  AST 26  ALT 18  ALKPHOS 36*  BILITOT 0.8  PROT 7.7  ALBUMIN 4.1   Recent Labs  Lab 04/17/23 0122  LIPASE 30   No results for input(s): "AMMONIA" in the last 168 hours. CBC: Recent Labs  Lab 04/17/23 0122 04/17/23 0552 04/18/23 0412 04/19/23 0330  WBC 16.9* 14.7* 12.5* 10.0  HGB 13.6 13.0 12.1 11.4*  HCT 42.2 39.7 37.8 37.3  MCV 91.3 91.5 93.1 97.4  PLT 269 244 215 188   Cardiac Enzymes: No results for input(s): "CKTOTAL", "CKMB", "CKMBINDEX", "TROPONINI" in the last 168 hours. BNP: Invalid input(s): "POCBNP" CBG: Recent Labs  Lab 04/18/23 2003 04/18/23 2356 04/19/23 0432 04/19/23 0737 04/19/23 1129  GLUCAP 171* 148* 149* 168* 197*   D-Dimer No results for input(s): "DDIMER" in the last 72 hours. Hgb A1c Recent Labs    04/17/23 0552  HGBA1C 8.4*   Lipid Profile Recent Labs    04/18/23 0412  CHOL 161  HDL 33*  LDLCALC 59  TRIG 098*  CHOLHDL 4.9   Thyroid function studies No results for input(s): "TSH", "T4TOTAL", "T3FREE",  "THYROIDAB" in the last 72 hours.  Invalid input(s): "FREET3" Anemia work up No results for input(s): "VITAMINB12", "FOLATE", "FERRITIN", "TIBC", "IRON", "RETICCTPCT" in the last 72 hours. Urinalysis    Component Value Date/Time   COLORURINE YELLOW 04/17/2023 0252   APPEARANCEUR HAZY (A) 04/17/2023 0252   LABSPEC 1.042 (H) 04/17/2023 0252   PHURINE 5.0 04/17/2023 0252   GLUCOSEU >=500 (A) 04/17/2023 0252   HGBUR NEGATIVE 04/17/2023 0252   BILIRUBINUR NEGATIVE 04/17/2023 0252   KETONESUR 5 (A) 04/17/2023 0252   PROTEINUR NEGATIVE 04/17/2023 0252   UROBILINOGEN 0.2 09/25/2015 1211   NITRITE POSITIVE (A) 04/17/2023 0252   LEUKOCYTESUR TRACE (A) 04/17/2023 0252   Sepsis Labs Recent Labs  Lab 04/17/23 0122 04/17/23 0552 04/18/23 0412 04/19/23 0330  WBC 16.9* 14.7* 12.5* 10.0   Microbiology No results found for this or any previous visit (from the past 240 hour(s)).   Time coordinating discharge: 36 minutes  SIGNED:  Alwyn Ren, MD  [facility] 04/19/2023, 4:00 PM

## 2023-04-19 NOTE — Progress Notes (Signed)
Subjective/Chief Complaint: PT doing well this AM Tol FLD yesterday +BM/flatus   Objective: Vital signs in last 24 hours: Temp:  [97.8 F (36.6 C)-98.8 F (37.1 C)] 97.8 F (36.6 C) (04/21 0437) Pulse Rate:  [56-62] 56 (04/21 0437) Resp:  [18-20] 18 (04/21 0437) BP: (123-125)/(61-68) 123/61 (04/21 0437) SpO2:  [99 %-100 %] 99 % (04/21 0437) Last BM Date : 04/18/23  Intake/Output from previous day: 04/20 0701 - 04/21 0700 In: 3578.1 [P.O.:600; I.V.:2591.7; IV Piggyback:386.4] Out: -  Intake/Output this shift: No intake/output data recorded.  PE:  Constitutional: No acute distress, conversant, appears states age. Eyes: Anicteric sclerae, moist conjunctiva, no lid lag Lungs: Clear to auscultation bilaterally, normal respiratory effort CV: regular rate and rhythm, no murmurs, no peripheral edema, pedal pulses 2+ GI: Soft, no masses or hepatosplenomegaly, non-tender to palpation Skin: No rashes, palpation reveals normal turgor Psychiatric: appropriate judgment and insight, oriented to person, place, and time   Lab Results:  Recent Labs    04/18/23 0412 04/19/23 0330  WBC 12.5* 10.0  HGB 12.1 11.4*  HCT 37.8 37.3  PLT 215 188   BMET Recent Labs    04/18/23 0412 04/19/23 0330  NA 137 135  K 3.1* 3.8  CL 103 110  CO2 24 16*  GLUCOSE 185* 160*  BUN 21 11  CREATININE 0.76 0.76  CALCIUM 8.9 8.2*   PT/INR No results for input(s): "LABPROT", "INR" in the last 72 hours. ABG No results for input(s): "PHART", "HCO3" in the last 72 hours.  Invalid input(s): "PCO2", "PO2"  Studies/Results: DG Abd Portable 1V-Small Bowel Obstruction Protocol-initial, 8 hr delay  Result Date: 04/18/2023 CLINICAL DATA:  Small-bowel obstruction, 8 hour delayed examination EXAM: PORTABLE ABDOMEN - 1 VIEW COMPARISON:  None Available. FINDINGS: Nasogastric tube tip overlies the expected distal body of the stomach. Normal abdominal gas pattern. Administered oral contrast opacifies a  nondistended colon. No gross free intraperitoneal gas. Cholecystectomy clips seen in the right upper quadrant. IMPRESSION: 1. Normal abdominal gas pattern. Administered oral contrast now within the colon which appears decompressed. Electronically Signed   By: Helyn Numbers M.D.   On: 04/18/2023 01:05   DG Basil Dess Tube Plc W/Fl W/Rad  Result Date: 04/17/2023 CLINICAL DATA:  Small bowel obstruction EXAM: NASO G TUBE PLACEMENT WITH FL AND WITH RAD FLUOROSCOPY: Radiation Exposure Index (if provided by the fluoroscopic device): 77.3 mGy air kerma COMPARISON:  CT abdomen 04/17/2023 FINDINGS: Following anesthetizing the right nostril with Cetacaine spray and viscous lidocaine, a nasogastric tube was advanced into the stomach under fluoroscopic observation. Initially the tube was caught up in a moderate-sized hiatal hernia and was surprisingly reticent to take the anterior turn into the intraabdominal portion of the stomach. Contrast injected into the hiatal hernia through the tube tended to reflux back up the esophagus rather extend distally into the intra-abdominal portion of the stomach; I manually suctioned most of this contrast back out. With various manipulations including use of an Amplatz super stiff wire, I was able to advance the nasogastric tube into the stomach body. The tube was taped in place with side port and distal tip in the stomach body. The nasogastric tube is ready to use. IMPRESSION: 1. Successful placement of a nasogastric tube into the stomach body. The tube is ready to use. 2. Moderate-sized hiatal hernia. Electronically Signed   By: Gaylyn Rong M.D.   On: 04/17/2023 15:03    Anti-infectives: Anti-infectives (From admission, onward)    None  Assessment/Plan: SBO - appears resolved on KUB -OK to ADAT -OK for DC when tol Soft - Keep K > 4 and Mg > 2 for bowel function - Mobilize for bowel function   FEN - soft, IVF per [facility] VTE - SCDs, subq heparin ID - None  LOS: 2  days    Axel Filler 04/19/2023

## 2023-04-20 ENCOUNTER — Telehealth: Payer: Self-pay | Admitting: Internal Medicine

## 2023-04-20 NOTE — Telephone Encounter (Signed)
Patients daughter called stated the patient was recently seen at the [facility] for SBO seeking further advise.

## 2023-04-20 NOTE — Telephone Encounter (Signed)
Pts daughter aware of appt.

## 2023-04-20 NOTE — Telephone Encounter (Signed)
Pt scheduled to see Quentin Mulling PA 04/23/23 at 1:30pm for f/u of SBO.

## 2023-04-22 NOTE — Progress Notes (Unsigned)
04/23/2023 Kimberly Mcgee [id number] 07/25/1925  Referring provider: Creola Corn, MD Primary GI doctor: Dr. Rhea Belton  ASSESSMENT AND PLAN:   SBO (small bowel obstruction) From CT in the hospital had clear transition point likely adhesions. Soft but firmer ab today, will get repeat stat KUB To liquid diet tonight, otherwise to soft low fiber diet.  Chronic constipation Continue Motegrity and MiraLAX, keep stools soft/loose.   Patient Care Team: Creola Corn, MD as PCP - General (Internal Medicine)  HISTORY OF PRESENT ILLNESS: 89 y.o. female with a past medical history of hypertension, chronic constipation, previous cholecystectomy and abdominal hysterectomy and others listed below, presents for hospital follow up.   Patient was in the hospital from 04/17/23 to 04/19/23, was in the hospital for small bowel obstruction. CT with small bowel obstruction with transition in the right anterior abdomen,, treated conservatively with NGT, bowel rest, mobilization. She had 2 attempts of NG tube in the room but then taken to IR for NG tube due to chronic hiatal hernia.  Repeat KUB showed resolution, patient discharged home with follow-up here.  She has epigastric tightness.  Was hoarse after NG tube but no dysphagia. Denies GERD. She denies nausea, vomiting.  She is passing gas, having Bm's.  She has been having increased anxiety, started on remeron.   2006 colonoscopy Dr. Rhea Belton stricture/stenosis of rectum dilated  06/29/2019 flex sig with anal stricture found on digital rectal exam with manual dilation done during DRE.   Patient's failed multiple over-the-counter and prescription constipation aids, see other notes for full details, currently on Motegrity and MiraLAX and has been having daily bowel movements.   She  reports that she has never smoked. She has never used smokeless tobacco. She reports that she does not drink alcohol and does not use drugs.  RELEVANT LABS AND  IMAGING: CBC    Component Value Date/Time   WBC 10.0 04/19/2023 0330   RBC 3.83 (L) 04/19/2023 0330   HGB 11.4 (L) 04/19/2023 0330   HCT 37.3 04/19/2023 0330   PLT 188 04/19/2023 0330   MCV 97.4 04/19/2023 0330   MCH 29.8 04/19/2023 0330   MCHC 30.6 04/19/2023 0330   RDW 14.4 04/19/2023 0330   LYMPHSABS 2.7 07/12/2019 1426   MONOABS 0.7 07/12/2019 1426   EOSABS 0.2 07/12/2019 1426   BASOSABS 0.1 07/12/2019 1426   Recent Labs    04/17/23 0122 04/17/23 0552 04/18/23 0412 04/19/23 0330  HGB 13.6 13.0 12.1 11.4*    CMP     Component Value Date/Time   NA 135 04/19/2023 0330   K 3.8 04/19/2023 0330   CL 110 04/19/2023 0330   CO2 16 (L) 04/19/2023 0330   GLUCOSE 160 (H) 04/19/2023 0330   BUN 11 04/19/2023 0330   CREATININE 0.76 04/19/2023 0330   CALCIUM 8.2 (L) 04/19/2023 0330   PROT 7.7 04/17/2023 0122   ALBUMIN 4.1 04/17/2023 0122   AST 26 04/17/2023 0122   ALT 18 04/17/2023 0122   ALKPHOS 36 (L) 04/17/2023 0122   BILITOT 0.8 04/17/2023 0122   GFRNONAA >60 04/19/2023 0330   GFRAA >60 08/17/2019 1001      Latest Ref Rng & Units 04/17/2023    1:22 AM 08/17/2019   10:01 AM 07/12/2019    2:26 PM  Hepatic Function  Total Protein 6.5 - 8.1 g/dL 7.7  6.7  7.1   Albumin 3.5 - 5.0 g/dL 4.1  3.6  4.2   AST 15 - 41 U/L 26  20  17   ALT 0 - 44 U/L Alk Phosphatase 38 - 126 U/L 36  31  33   Total Bilirubin 0.3 - 1.2 mg/dL 0.8  0.7  0.4       Current Medications:    Current Outpatient Medications (Cardiovascular):    losartan-hydrochlorothiazide (HYZAAR) 100-25 MG per tablet, Take 1 tablet by mouth daily after breakfast.    Current Outpatient Medications (Analgesics):    acetaminophen (TYLENOL) 650 MG CR tablet, Take 650 mg by mouth every 8 (eight) hours as needed for pain.   Current Outpatient Medications (Other):    Calcium Carbonate-Vitamin D (CALCIUM 600 + D PO), Take 1 tablet by mouth 2 (two) times daily.     citalopram (CELEXA) 20 MG tablet, Take  20 mg by mouth daily after breakfast.    mirtazapine (REMERON) 15 MG tablet, Take 15 mg by mouth at bedtime.   Multiple Vitamins-Minerals (ONE-A-DAY WOMENS 50 PLUS PO), Take 1 tablet by mouth daily.     polyethylene glycol powder (GLYCOLAX/MIRALAX) 17 GM/SCOOP powder, Take 17 g by mouth daily.   Prucalopride Succinate (MOTEGRITY) 2 MG TABS, TAKE 1 TABLET(2 MG) BY MOUTH DAILY (Patient taking differently: Take 2 mg by mouth daily. TAKE 1 TABLET(2 MG) BY MOUTH DAILY)   ondansetron (ZOFRAN) 4 MG tablet, Take 1 tablet (4 mg total) by mouth every 6 (six) hours as needed for nausea. (Patient not taking: Reported on 04/23/2023)  Medical History:  Past Medical History:  Diagnosis Date   Arthritis    right knee   Bladder prolapse, female, acquired    Gallstones    Hypertension    Allergies: No Known Allergies   Surgical History:  She  has a past surgical history that includes Abdominal hysterectomy; Hemorroidectomy; Knee surgery; and Cholecystectomy (N/A, 08/23/2019). Family History:  Her family history includes Uterine cancer in her mother.  REVIEW OF SYSTEMS  : All other systems reviewed and negative except where noted in the History of Present Illness.  PHYSICAL EXAM: BP 124/74   Pulse 71   Ht  (1.549 m)   Wt 156 lb (70.8 kg)   BMI 29.48 kg/m  General Appearance: Well nourished, in no apparent distress. Head:   Normocephalic and atraumatic. Eyes:  sclerae anicteric,conjunctive pink  Respiratory: Respiratory effort normal, BS equal bilaterally without rales, rhonchi, wheezing. Cardio: RRR with no MRGs. Peripheral pulses intact.  Abdomen: Distended upper AB, but still soft ,active bowel sounds. mild tenderness in the epigastrium. Without guarding and Without rebound. No masses. Rectal: Not evaluated Musculoskeletal: Full ROM, Normal gait. Without edema. Skin:  Dry and intact without significant lesions or rashes Neuro: Alert and  oriented x4;  No focal deficits. Psych:   Cooperative. Normal mood and affect.    Doree Albee, PA-C 2:15 PM

## 2023-04-23 ENCOUNTER — Encounter: Payer: Self-pay | Admitting: Physician Assistant

## 2023-04-23 ENCOUNTER — Ambulatory Visit (INDEPENDENT_AMBULATORY_CARE_PROVIDER_SITE_OTHER)
Admission: RE | Admit: 2023-04-23 | Discharge: 2023-04-23 | Disposition: A | Discharge status: Home / Self Care | Payer: HMO | Source: Ambulatory Visit | Attending: Physician Assistant | Admitting: Physician Assistant

## 2023-04-23 ENCOUNTER — Ambulatory Visit (INDEPENDENT_AMBULATORY_CARE_PROVIDER_SITE_OTHER): Payer: HMO | Admitting: Physician Assistant

## 2023-04-23 VITALS — BP 124/74 | HR 71 | Ht 61.0 in | Wt 156.0 lb

## 2023-04-23 DIAGNOSIS — K56609 Unspecified intestinal obstruction, unspecified as to partial versus complete obstruction: Secondary | ICD-10-CM

## 2023-04-23 DIAGNOSIS — Z9049 Acquired absence of other specified parts of digestive tract: Secondary | ICD-10-CM | POA: Diagnosis not present

## 2023-04-23 DIAGNOSIS — K624 Stenosis of anus and rectum: Secondary | ICD-10-CM | POA: Diagnosis not present

## 2023-04-23 DIAGNOSIS — F411 Generalized anxiety disorder: Secondary | ICD-10-CM | POA: Diagnosis not present

## 2023-04-23 DIAGNOSIS — R1010 Upper abdominal pain, unspecified: Secondary | ICD-10-CM | POA: Diagnosis not present

## 2023-04-23 DIAGNOSIS — E781 Pure hyperglyceridemia: Secondary | ICD-10-CM | POA: Diagnosis not present

## 2023-04-23 DIAGNOSIS — E1122 Type 2 diabetes mellitus with diabetic chronic kidney disease: Secondary | ICD-10-CM | POA: Diagnosis not present

## 2023-04-23 DIAGNOSIS — K5909 Other constipation: Secondary | ICD-10-CM

## 2023-04-23 DIAGNOSIS — G4700 Insomnia, unspecified: Secondary | ICD-10-CM | POA: Diagnosis not present

## 2023-04-23 DIAGNOSIS — K581 Irritable bowel syndrome with constipation: Secondary | ICD-10-CM | POA: Diagnosis not present

## 2023-04-23 DIAGNOSIS — E559 Vitamin D deficiency, unspecified: Secondary | ICD-10-CM | POA: Diagnosis not present

## 2023-04-23 DIAGNOSIS — I129 Hypertensive chronic kidney disease with stage 1 through stage 4 chronic kidney disease, or unspecified chronic kidney disease: Secondary | ICD-10-CM | POA: Diagnosis not present

## 2023-04-23 NOTE — Patient Instructions (Addendum)
Your provider has requested that you have an abdominal x ray before leaving today. Please go to the basement floor to our Radiology department for the test.  Please follow up in 6 months. Give [place] a call at [phone] to schedule an appointment.   Bowel Obstruction A bowel obstruction is a blockage in the small or large bowel. The bowel is also called the intestine. It is a long tube that connects the stomach to the anus. When a person eats and drinks, food and fluids go from the mouth to the stomach to the small bowel. This is where most of the nutrients in the food and fluids are absorbed. After the small bowel, material passes through the large bowel for further absorption until any leftover material leaves the body as stool (feces) through the anus during a bowel movement. A bowel obstruction will prevent food and fluids from passing through the bowel as they normally do during digestion. The bowel can become partially or completely blocked. If this condition is not treated, it can be dangerous because the bowel could rupture. What are the causes? Common causes of this condition include: Scar tissue in the body (adhesions) from previous surgery or treatment with high-energy X-rays (radiation). Recent surgery. This may cause the movements of the bowel to slow down and cause food to block the intestine. Inflammatory bowel disease, such as Crohn's disease or diverticulitis. Growths or tumors. A bulging organ or tissue (hernia). Twisting of the bowel (volvulus). A swallowed object (foreign body). Slipping of a part of the bowel into another part (intussusception). What are the signs or symptoms? Symptoms of this condition include: Pain in the abdomen. Depending on the degree of obstruction, pain may be: Mild or severe. Dull cramping or sharp pain. In one area or in the entire abdomen. Nausea and vomiting. Vomit may be greenish or a yellow bile color. Bloating in the  abdomen. Constipation. Being unable to pass gas. Frequent belching. Diarrhea. This may occur if the obstruction is partial and runny stool is able to leak around the obstruction. How is this diagnosed? This condition may be diagnosed based on: A physical exam. Your medical history. Exams to look into the small intestine or the large intestine (endoscopy or colonoscopy). Imaging tests of the abdomen or pelvis, such as X-ray or CT scan. Blood or urine tests. How is this treated? Treatment for this condition depends on the cause and severity of the problem. Treatment may include: Fluids and pain medicines that are given through an IV. Your health care provider may tell you not to eat or drink if you have nausea or vomiting. A clear liquid diet. You may be asked to consume a clear liquid diet for several days. This allows the bowel to rest. Placement of a small tube (nasogastric tube) through the nose, down the throat, and into the stomach. This may relieve pain, discomfort, and nausea by removing blocked air and fluids from the stomach. It can also help the obstruction clear up faster. Surgery. This may be required if other treatments do not work. Surgery may be required for: Bowel obstruction from a hernia. This can be an emergency procedure. Scar tissue that causes frequent or severe obstructions. Follow these instructions at home: Medicines Take over-the-counter and prescription medicines only as told by your health care provider. If you were prescribed an antibiotic medicine, take it as told by your health care provider. Do not stop taking the antibiotic even if you start to feel better. General instructions Follow  instructions from your health care provider about eating and drinking restrictions. You may need to avoid solid foods and drink only clear liquids until your condition improves. Return to your normal activities as told by your health care provider. Ask your health care provider  what activities are safe for you. Rest as told by your health care provider. Avoid sitting for a long time without moving. Get up to take short walks every 1-2 hours. This is important to improve blood flow and breathing. Ask for help if you feel weak or unsteady. Keep all follow-up visits. This is important. How is this prevented? After having a bowel obstruction, you are more likely to have another. You may do the following things to prevent another obstruction: If you have a long-term (chronic) disease, pay attention to your symptoms and contact your health care provider if you have questions or concerns. Avoid becoming constipated. You may need to take these actions to prevent or treat constipation: Drink enough fluid to keep your urine pale yellow. Take over-the-counter or prescription medicines. Eat foods that are high in fiber, such as beans, whole grains, and fresh fruits and vegetables. Limit foods that are high in fat and processed sugars, such as fried or sweet foods. Stay active. Exercise for 30 minutes or more, 5 or more days each week. Ask your health care provider which exercises are safe for you. Avoid stress. Find ways to reduce stress, such as meditation, exercise, or taking time for activities that relax you. Instead of eating three large meals each day, eat three small meals with three small snacks. Work with a Data processing manager to make a healthy meal plan that works for you. Do not use any products that contain nicotine or tobacco. These products include cigarettes, chewing tobacco, and vaping devices, such as e-cigarettes. If you need help quitting, ask your health care provider. Contact a health care provider if: You have a fever. You have chills. Get help right away if: You have increased pain or cramping. You vomit blood. You have uncontrolled vomiting or nausea. You cannot drink fluids because of vomiting or pain. You become confused. You begin feeling very thirsty  (dehydrated). You have severe bloating. You feel extremely weak or you faint. Summary A bowel obstruction is a blockage in the small or large bowel. A bowel obstruction will prevent food and fluids from passing through the bowel as they normally do during digestion. Treatment for this condition depends on the cause and severity of the problem. It may include fluids and pain medicines through an IV, a simple diet, a nasogastric tube, or surgery. Follow instructions from your health care provider about eating restrictions. You may need to avoid solid foods and consume only clear liquids until your condition improves. This information is not intended to replace advice given to you by your health care provider. Make sure you discuss any questions you have with your health care provider. Document Revised: 01/27/2021 Document Reviewed: 01/27/2021 Elsevier Patient Education  2023 Elsevier Inc.  Low-Fiber Eating Plan Fiber is found in fruits, vegetables, whole grains, and beans. Eating a diet low in fiber helps to reduce how often you have bowel movements and the amount of stool you produce. A low-fiber eating plan may help your digestive system heal if you: Have certain conditions, such as Crohn's disease, diverticulitis, or irritable bowel syndrome (IBS), and are having a flare-up. Recently had radiation therapy on your pelvis or bowel. Recently had intestinal surgery. Have a new surgical opening in your abdomen (  colostomy or ileostomy). Have an intestine that has narrowed (stricture). Your health care provider will tell you how long to stay on this diet and may recommend that you work with a dietitian. What are tips for following this plan? Reading food labels  Check the nutrition facts label on food products for the amount of dietary fiber. Choose foods that have less than 2 grams (g) of fiber per serving. Cooking Use white flour for baking and cooking. Cook meat using methods that keep it  tender, such as braising or poaching. Cook eggs until the yolk is completely solid. Cook with healthy oils, such as olive oil or canola oil. Meal planning Eat 5-6 small meals throughout the day instead of 3 large meals. If you are lactose intolerant: Choose low-lactose dairy foods. Do not eat dairy foods if told by your health care provider or dietitian. Limit fats and oils to less than 8 teaspoons (39 mL) a day. Eat small portions of desserts. Limit acidic, spicy, or fried foods to reduce gas, bloating, and discomfort. General information Follow instructions from your health care provider or dietitian about how much fiber you should have each day. Most people on a low-fiber eating plan should eat less than 10 g of fiber a day. Your daily fiber goal is _________________ g. Take vitamin and mineral supplements as told by your health care provider or dietitian. Chewable or liquid forms are best when on this eating plan. A gummy vitamin is not recommended. What foods should I eat? Fruits Soft-cooked or canned fruits without skin and seeds. Ripe banana. Applesauce. Fruit juice without pulp. Vegetables Well-cooked or canned vegetables without skin, seeds, or stems. Cooked potatoes without skins. Vegetable juice. Grains All bread and crackers made with white flour. Waffles, pancakes, and [place] toast. Bagels. Pretzels. Melba toast, zwieback, and matzoh. Cooked and dried cereals that do not have whole grains, added fiber, seeds, or dried fruit. Denzil Magnuson. Hot and cold cereals made with refined corn, rice, or oats. Plain pasta and noodles. White rice. Meats and other proteins Ground meat. Tender cuts of meat or poultry. Eggs. Fish, seafood, and shellfish. Smooth nut butters. Tofu. Dairy All milk products and drinks. Lactose-free milk, including rice, soy, and almond milk. Yogurt without fruit, nuts, chocolate, or granola mixed in. Sour cream. Cottage cheese. Cheese. Fats and oils Olive oil, canola  oil, sunflower oil, flaxseed oil, avocado oil, and grapeseed oil. Mayonnaise. Cream cheese. Margarine. Butter. Beverages Decaf coffee. Fruit and vegetable juices. Smoothies (in small amounts, with no pulp or skins, and with fruits from the recommended list). Sports drinks. Herbal tea. Water. Sweets and desserts Plain cakes. Cookies. Cream pies and pies made with recommended fruits. Pudding. Custard. Fruit gelatin. Sherbet. Ice pops. Ice cream without nuts. Hard candy. Honey. Jelly. Molasses. Syrups. Chocolate. Marshmallows. Gumdrops. Seasonings and condiments Ketchup. Mild mustard. Mild salad dressings. Plain gravies. Vinegar. Spices in moderation. Salt. Sugar. Other foods Bouillon. Broth. Cream and strained soups made from recommended foods. Casseroles made with recommended foods. The items listed above may not be a complete list of foods and beverages you can eat. Contact a dietitian for more information. What foods should I avoid? Fruits Raw or dried fruit. Berries. Fruit juice with pulp. Prune juice. Vegetables Potato skins. Raw or undercooked vegetables. All beans and bean sprouts. Cooked greens. Corn. Peas. Cabbage. Beets. Broccoli. Brussels sprouts. Cauliflower. Mushrooms. Onions. Peppers. Parsnips. Okra. Sauerkraut. Grains Whole-wheat, whole-grain, or multigrain breads, cereals, or crackers. Rye bread. Cereals with nuts, raisins, or coconut. Bran. Granola.  High-fiber cereals. Cornmeal or corn bread. Whole-grain pasta. Wild or brown rice. Quinoa. Popcorn. Buckwheat. Wheat germ. Meats and other proteins Tough, fibrous meats with gristle. Fatty meat. Poultry with skin. Fried meat, Environmental education officer, or fish. Precooked or cured meat, such as sausages or meat loaves. Tomasa Blase. Hot dogs. Nuts and chunky nut butter. Dried peas, beans, and lentils. Hummus. Dairy Yogurt with fruit, nuts, chocolate, or granola mixed in. Full-fat dairy such as whole milk, ice cream, or sour cream. Beverages Caffeinated coffee  and teas. Fats and oils Avocado. Coconut. Butter. Sweets and desserts Desserts, cookies, or candies that contain nuts or coconut. Dried fruit. Jams and preserves with seeds. Marmalade. Any dessert made with fruits or grains that are not recommended. Seasonings and condiments Relish. Horseradish. Rosita Fire. Olives. Other foods Corn tortilla chips. Soups made with vegetables or grains that are not recommended. The items listed above may not be a complete list of foods and beverages you should avoid. Contact a dietitian for more information. Summary Most people on a low-fiber eating plan should eat less than 10 grams of fiber a day. Follow recommendations from your health care provider or dietitian about how much fiber you should have each day. Always check nutrition facts labels to see the dietary fiber amount in packaged foods. A low-fiber food will have less than 2 grams of fiber per serving. Try to avoid whole grains, raw fruits and vegetables, dried fruit, tough cuts of meat, nuts, and seeds. Take a vitamin and mineral supplement as told by your health care provider or dietitian. This information is not intended to replace advice given to you by your health care provider. Make sure you discuss any questions you have with your health care provider. Document Revised: 04/19/2020 Document Reviewed: 04/19/2020 Elsevier Patient Education  2023 [employer].

## 2023-04-24 ENCOUNTER — Telehealth: Payer: Self-pay | Admitting: Physician Assistant

## 2023-04-24 NOTE — Telephone Encounter (Signed)
Kimberly Mulling, PA reviewed patient's abdominal xray result. See below:  "Great news, no dilated bowel to suggest obstruction.  Does show some gas and stool in the colon.  Make sure you are continuing Motegrity and MiraLAX.  Can go back to just soft diet, low fiber diet"  Patient's daughter Kimberly Mcgee made aware. She voiced understanding and no further questions and concerns at the of call.

## 2023-04-24 NOTE — Telephone Encounter (Signed)
Patient's daughter called regarding results of X.ray that was ordered by [facility] yesterday. Patient and her daughter are both anxious about results. Please advise.

## 2023-04-28 NOTE — Telephone Encounter (Signed)
Pts daughter is calling wanting some guidance on what pt can eat. Can she follow a low fiber diet or does it have to be a NO fiber diet. Also states they were told she cannot have caffeine. Pt usually has 1 cup of instant coffee in the am. Is it ok for her to have this 1 cup or does she need to have absolutely no caffeine,   Also want to know if Marchelle Folks feels them meeting with a dietitian would be beneficial for the pt/family. Please advise.

## 2023-04-28 NOTE — Telephone Encounter (Signed)
Inbound call from patient daughter wanting to discuss patient seeing a nutritionist. Please advise.

## 2023-04-29 NOTE — Telephone Encounter (Signed)
Low fiber diet is fine, if she has symptoms go to liquid diet and do bowel rest.  Coffee 1 cup in the morning is fine.  I can refer to dietician but medicare may not cover it.  There is a website to go to as well, eatright.org where you can find a dietician in the area.

## 2023-04-30 ENCOUNTER — Ambulatory Visit (INDEPENDENT_AMBULATORY_CARE_PROVIDER_SITE_OTHER)
Admission: RE | Admit: 2023-04-30 | Discharge: 2023-04-30 | Disposition: A | Discharge status: Home / Self Care | Payer: HMO | Source: Ambulatory Visit | Attending: Physician Assistant | Admitting: Physician Assistant

## 2023-04-30 ENCOUNTER — Telehealth: Payer: Self-pay | Admitting: Physician Assistant

## 2023-04-30 DIAGNOSIS — K56609 Unspecified intestinal obstruction, unspecified as to partial versus complete obstruction: Secondary | ICD-10-CM

## 2023-04-30 DIAGNOSIS — R140 Abdominal distension (gaseous): Secondary | ICD-10-CM | POA: Diagnosis not present

## 2023-04-30 NOTE — Telephone Encounter (Signed)
Patient called states her stomach is still extended and she is on a soft diet seeking further advise.

## 2023-04-30 NOTE — Telephone Encounter (Signed)
Spoke with pts daughter and she is aware of Quentin Mulling PA's recommendations, order in epic, pt will come for xray.

## 2023-04-30 NOTE — Telephone Encounter (Signed)
See message below. Do you want her to go to clear liquid diet for several days now?

## 2023-04-30 NOTE — Telephone Encounter (Signed)
Please get her to come in for Xray Can go on full liquid diet for a few days.  Advised to go to the ER if there is any severe abdominal pain, unable to hold down food/water, blood in stool or vomit, chest pain, shortness of breath, or any worsening symptoms.

## 2023-05-01 ENCOUNTER — Telehealth: Payer: Self-pay | Admitting: Physician Assistant

## 2023-05-01 NOTE — Telephone Encounter (Signed)
The pt daughter has been advised that the xray has not resulted.  We will call her as soon as able.  The pt has been advised of the information and verbalized understanding.

## 2023-05-01 NOTE — Telephone Encounter (Signed)
PT is calling to discuss results of x ray. Please advise.

## 2023-05-01 NOTE — Telephone Encounter (Signed)
PT daughter is calling to speak about x ray results. Please advise.

## 2023-05-04 NOTE — Telephone Encounter (Signed)
Inbound call from daughter, is requesting a call in regards to results. She was advised they were not resulted yet.

## 2023-05-04 NOTE — Telephone Encounter (Signed)
Will send to Amanda's nurse  

## 2023-05-04 NOTE — Telephone Encounter (Signed)
Pts daughter calling for abd xray results. Please advise.

## 2023-05-04 NOTE — Telephone Encounter (Signed)
Results sent to pt via mychart

## 2023-05-05 ENCOUNTER — Telehealth: Payer: Self-pay

## 2023-05-05 DIAGNOSIS — R61 Generalized hyperhidrosis: Secondary | ICD-10-CM | POA: Diagnosis not present

## 2023-05-05 DIAGNOSIS — N1831 Chronic kidney disease, stage 3a: Secondary | ICD-10-CM | POA: Diagnosis not present

## 2023-05-05 DIAGNOSIS — J069 Acute upper respiratory infection, unspecified: Secondary | ICD-10-CM | POA: Diagnosis not present

## 2023-05-05 DIAGNOSIS — R051 Acute cough: Secondary | ICD-10-CM | POA: Diagnosis not present

## 2023-05-05 DIAGNOSIS — Z1152 Encounter for screening for COVID-19: Secondary | ICD-10-CM | POA: Diagnosis not present

## 2023-05-05 DIAGNOSIS — K56609 Unspecified intestinal obstruction, unspecified as to partial versus complete obstruction: Secondary | ICD-10-CM | POA: Diagnosis not present

## 2023-05-05 DIAGNOSIS — I129 Hypertensive chronic kidney disease with stage 1 through stage 4 chronic kidney disease, or unspecified chronic kidney disease: Secondary | ICD-10-CM | POA: Diagnosis not present

## 2023-05-05 NOTE — Telephone Encounter (Signed)
Pt called for xray results. Reviewed results and recommendations per Quentin Mulling PA with pt.

## 2023-06-02 DIAGNOSIS — M170 Bilateral primary osteoarthritis of knee: Secondary | ICD-10-CM | POA: Diagnosis not present

## 2023-07-10 DIAGNOSIS — G319 Degenerative disease of nervous system, unspecified: Secondary | ICD-10-CM | POA: Diagnosis not present

## 2023-07-10 DIAGNOSIS — D649 Anemia, unspecified: Secondary | ICD-10-CM | POA: Diagnosis not present

## 2023-07-10 DIAGNOSIS — E669 Obesity, unspecified: Secondary | ICD-10-CM | POA: Diagnosis not present

## 2023-07-10 DIAGNOSIS — H52203 Unspecified astigmatism, bilateral: Secondary | ICD-10-CM | POA: Diagnosis not present

## 2023-07-10 DIAGNOSIS — R54 Age-related physical debility: Secondary | ICD-10-CM | POA: Diagnosis not present

## 2023-07-10 DIAGNOSIS — R0602 Shortness of breath: Secondary | ICD-10-CM | POA: Diagnosis not present

## 2023-07-10 DIAGNOSIS — H524 Presbyopia: Secondary | ICD-10-CM | POA: Diagnosis not present

## 2023-07-10 DIAGNOSIS — I700 Atherosclerosis of aorta: Secondary | ICD-10-CM | POA: Diagnosis not present

## 2023-07-10 DIAGNOSIS — H26491 Other secondary cataract, right eye: Secondary | ICD-10-CM | POA: Diagnosis not present

## 2023-07-10 DIAGNOSIS — M179 Osteoarthritis of knee, unspecified: Secondary | ICD-10-CM | POA: Diagnosis not present

## 2023-07-10 DIAGNOSIS — N1831 Chronic kidney disease, stage 3a: Secondary | ICD-10-CM | POA: Diagnosis not present

## 2023-07-10 DIAGNOSIS — H353132 Nonexudative age-related macular degeneration, bilateral, intermediate dry stage: Secondary | ICD-10-CM | POA: Diagnosis not present

## 2023-07-10 DIAGNOSIS — R600 Localized edema: Secondary | ICD-10-CM | POA: Diagnosis not present

## 2023-07-10 DIAGNOSIS — R0600 Dyspnea, unspecified: Secondary | ICD-10-CM | POA: Diagnosis not present

## 2023-07-10 DIAGNOSIS — H43813 Vitreous degeneration, bilateral: Secondary | ICD-10-CM | POA: Diagnosis not present

## 2023-07-10 DIAGNOSIS — M170 Bilateral primary osteoarthritis of knee: Secondary | ICD-10-CM | POA: Diagnosis not present

## 2023-07-10 DIAGNOSIS — I129 Hypertensive chronic kidney disease with stage 1 through stage 4 chronic kidney disease, or unspecified chronic kidney disease: Secondary | ICD-10-CM | POA: Diagnosis not present

## 2023-07-10 DIAGNOSIS — E119 Type 2 diabetes mellitus without complications: Secondary | ICD-10-CM | POA: Diagnosis not present

## 2023-07-13 ENCOUNTER — Ambulatory Visit (HOSPITAL_COMMUNITY)
Admission: RE | Admit: 2023-07-13 | Discharge: 2023-07-13 | Disposition: A | Discharge status: Home / Self Care | Payer: HMO | Source: Ambulatory Visit | Attending: Surgery | Admitting: Surgery

## 2023-07-13 ENCOUNTER — Other Ambulatory Visit (HOSPITAL_COMMUNITY): Payer: Self-pay | Admitting: Internal Medicine

## 2023-07-13 DIAGNOSIS — R600 Localized edema: Secondary | ICD-10-CM | POA: Diagnosis not present

## 2023-09-12 ENCOUNTER — Other Ambulatory Visit: Payer: Self-pay | Admitting: Gastroenterology

## 2023-10-02 DIAGNOSIS — Z9181 History of falling: Secondary | ICD-10-CM | POA: Diagnosis not present

## 2023-10-02 DIAGNOSIS — Z0000 Encounter for general adult medical examination without abnormal findings: Secondary | ICD-10-CM | POA: Diagnosis not present

## 2023-10-02 DIAGNOSIS — E559 Vitamin D deficiency, unspecified: Secondary | ICD-10-CM | POA: Diagnosis not present

## 2023-10-02 DIAGNOSIS — E1122 Type 2 diabetes mellitus with diabetic chronic kidney disease: Secondary | ICD-10-CM | POA: Diagnosis not present

## 2023-10-02 DIAGNOSIS — I700 Atherosclerosis of aorta: Secondary | ICD-10-CM | POA: Diagnosis not present

## 2023-10-02 DIAGNOSIS — E781 Pure hyperglyceridemia: Secondary | ICD-10-CM | POA: Diagnosis not present

## 2023-10-02 DIAGNOSIS — Z1389 Encounter for screening for other disorder: Secondary | ICD-10-CM | POA: Diagnosis not present

## 2023-10-02 DIAGNOSIS — E871 Hypo-osmolality and hyponatremia: Secondary | ICD-10-CM | POA: Diagnosis not present

## 2023-10-02 DIAGNOSIS — M179 Osteoarthritis of knee, unspecified: Secondary | ICD-10-CM | POA: Diagnosis not present

## 2023-10-02 DIAGNOSIS — M8580 Other specified disorders of bone density and structure, unspecified site: Secondary | ICD-10-CM | POA: Diagnosis not present

## 2023-10-02 DIAGNOSIS — I129 Hypertensive chronic kidney disease with stage 1 through stage 4 chronic kidney disease, or unspecified chronic kidney disease: Secondary | ICD-10-CM | POA: Diagnosis not present

## 2023-10-02 DIAGNOSIS — N329 Bladder disorder, unspecified: Secondary | ICD-10-CM | POA: Diagnosis not present

## 2023-10-02 DIAGNOSIS — N1831 Chronic kidney disease, stage 3a: Secondary | ICD-10-CM | POA: Diagnosis not present

## 2023-10-02 DIAGNOSIS — F411 Generalized anxiety disorder: Secondary | ICD-10-CM | POA: Diagnosis not present

## 2023-10-09 DIAGNOSIS — M170 Bilateral primary osteoarthritis of knee: Secondary | ICD-10-CM | POA: Diagnosis not present

## 2023-11-06 NOTE — Progress Notes (Unsigned)
11/06/2023 Kimberly Mcgee [id number] March 26, 1925  Referring provider: Creola Corn, MD Primary GI doctor: Dr. Rhea Belton  ASSESSMENT AND PLAN:   SBO (small bowel obstruction) From CT in the hospital had clear transition point likely adhesions. Soft but firmer ab today, will get repeat stat KUB To liquid diet tonight, otherwise to soft low fiber diet.  Chronic constipation Continue Motegrity and MiraLAX, keep stools soft/loose.   Patient Care Team: Creola Corn, MD as PCP - General (Internal Medicine)  HISTORY OF PRESENT ILLNESS: 89 y.o. female with a past medical history of hypertension, chronic constipation, previous cholecystectomy and abdominal hysterectomy and others listed below, presents for follow-up with SBO.  03/2023 patient seen posthospital follow-up for small bowel obstruction. CT with small bowel obstruction with transition in the right anterior abdomen,, treated conservatively with NGT, bowel rest, mobilization. Patient was to continue Motegrity MiraLAX keep stools soft.  Patient had clear transition point likely adhesions.  Discussed low fiber diet keeping stool fat soft, liquid diet if patient starts having symptoms. 04/23/2023 KUB no dilated bowel to suggest obstruction 04/30/2023 KUB for abdominal discomfort and distention patient had called that showed unremarkable bowel gas pattern with normal retained stool burden  Patient presents today for follow-up  2006 colonoscopy Dr. Rhea Belton stricture/stenosis of rectum dilated  06/29/2019 flex sig with anal stricture found on digital rectal exam with manual dilation done during DRE.   Patient's failed multiple over-the-counter and prescription constipation aids, see other notes for full details, currently on Motegrity and MiraLAX and has been having daily bowel movements.   She  reports that she has never smoked. She has never used smokeless tobacco. She reports that she does not drink alcohol and does not use  drugs.  RELEVANT LABS AND IMAGING: CBC    Component Value Date/Time   WBC 10.0 04/19/2023 0330   RBC 3.83 (L) 04/19/2023 0330   HGB 11.4 (L) 04/19/2023 0330   HCT 37.3 04/19/2023 0330   PLT 188 04/19/2023 0330   MCV 97.4 04/19/2023 0330   MCH 29.8 04/19/2023 0330   MCHC 30.6 04/19/2023 0330   RDW 14.4 04/19/2023 0330   LYMPHSABS 2.7 07/12/2019 1426   MONOABS 0.7 07/12/2019 1426   EOSABS 0.2 07/12/2019 1426   BASOSABS 0.1 07/12/2019 1426   Recent Labs    04/17/23 0122 04/17/23 0552 04/18/23 0412 04/19/23 0330  HGB 13.6 13.0 12.1 11.4*    CMP     Component Value Date/Time   NA 135 04/19/2023 0330   K 3.8 04/19/2023 0330   CL 110 04/19/2023 0330   CO2 16 (L) 04/19/2023 0330   GLUCOSE 160 (H) 04/19/2023 0330   BUN 11 04/19/2023 0330   CREATININE 0.76 04/19/2023 0330   CALCIUM 8.2 (L) 04/19/2023 0330   PROT 7.7 04/17/2023 0122   ALBUMIN 4.1 04/17/2023 0122   AST 26 04/17/2023 0122   ALT 18 04/17/2023 0122   ALKPHOS 36 (L) 04/17/2023 0122   BILITOT 0.8 04/17/2023 0122   GFRNONAA >60 04/19/2023 0330   GFRAA >60 08/17/2019 1001      Latest Ref Rng & Units 04/17/2023    1:22 AM 08/17/2019   10:01 AM 07/12/2019    2:26 PM  Hepatic Function  Total Protein 6.5 - 8.1 g/dL 7.7  6.7  7.1   Albumin 3.5 - 5.0 g/dL 4.1  3.6  4.2   AST 15 - 41 U/L 26  20  17    ALT 0 - 44 U/L 18  19  15  Alk Phosphatase 38 - 126 U/L 36  31  33   Total Bilirubin 0.3 - 1.2 mg/dL 0.8  0.7  0.4       Current Medications:    Current Outpatient Medications (Cardiovascular):    losartan-hydrochlorothiazide (HYZAAR) 100-25 MG per tablet, Take 1 tablet by mouth daily after breakfast.    Current Outpatient Medications (Analgesics):    acetaminophen (TYLENOL) 650 MG CR tablet, Take 650 mg by mouth every 8 (eight) hours as needed for pain.  Current Outpatient Medications (Hematological):    apixaban (ELIQUIS) 2.5 MG TABS tablet, Take 2.5 mg by mouth 2 (two) times daily.  Current Outpatient  Medications (Other):    Calcium Carbonate-Vitamin D (CALCIUM 600 + D PO), Take 1 tablet by mouth 2 (two) times daily.     citalopram (CELEXA) 20 MG tablet, Take 20 mg by mouth daily after breakfast.    mirtazapine (REMERON) 15 MG tablet, Take 15 mg by mouth at bedtime.   MOTEGRITY 2 MG TABS, TAKE 1 TABLET(2 MG) BY MOUTH DAILY   Multiple Vitamins-Minerals (ONE-A-DAY WOMENS 50 PLUS PO), Take 1 tablet by mouth daily.     ondansetron (ZOFRAN) 4 MG tablet, Take 1 tablet (4 mg total) by mouth every 6 (six) hours as needed for nausea. (Patient not taking: Reported on 04/23/2023)   polyethylene glycol powder (GLYCOLAX/MIRALAX) 17 GM/SCOOP powder, Take 17 g by mouth daily.  Medical History:  Past Medical History:  Diagnosis Date   Arthritis    right knee   Bladder prolapse, female, acquired    Gallstones    Hypertension    Allergies: No Known Allergies   Surgical History:  She  has a past surgical history that includes Abdominal hysterectomy; Hemorroidectomy; Knee surgery; and Cholecystectomy (N/A, 08/23/2019). Family History:  Her family history includes Uterine cancer in her mother.  REVIEW OF SYSTEMS  : All other systems reviewed and negative except where noted in the History of Present Illness.  PHYSICAL EXAM: There were no vitals taken for this visit. General Appearance: Well nourished, in no apparent distress. Head:   Normocephalic and atraumatic. Eyes:  sclerae anicteric,conjunctive pink  Respiratory: Respiratory effort normal, BS equal bilaterally without rales, rhonchi, wheezing. Cardio: RRR with no MRGs. Peripheral pulses intact.  Abdomen: Distended upper AB, but still soft ,active bowel sounds. mild tenderness in the epigastrium. Without guarding and Without rebound. No masses. Rectal: Not evaluated Musculoskeletal: Full ROM, Normal gait. Without edema. Skin:  Dry and intact without significant lesions or rashes Neuro: Alert and  oriented x4;  No focal deficits. Psych:   Cooperative. Normal mood and affect.    Doree Albee, PA-C 3:58 PM

## 2023-11-09 ENCOUNTER — Ambulatory Visit (INDEPENDENT_AMBULATORY_CARE_PROVIDER_SITE_OTHER): Payer: HMO | Admitting: Physician Assistant

## 2023-11-09 ENCOUNTER — Encounter: Payer: Self-pay | Admitting: Physician Assistant

## 2023-11-09 VITALS — BP 122/74 | HR 64 | Ht 61.0 in | Wt 169.0 lb

## 2023-11-09 DIAGNOSIS — Z9049 Acquired absence of other specified parts of digestive tract: Secondary | ICD-10-CM

## 2023-11-09 DIAGNOSIS — K56609 Unspecified intestinal obstruction, unspecified as to partial versus complete obstruction: Secondary | ICD-10-CM | POA: Diagnosis not present

## 2023-11-09 DIAGNOSIS — K921 Melena: Secondary | ICD-10-CM

## 2023-11-09 DIAGNOSIS — K5909 Other constipation: Secondary | ICD-10-CM | POA: Diagnosis not present

## 2023-11-09 MED ORDER — PANTOPRAZOLE SODIUM 40 MG PO TBEC
40.0000 mg | DELAYED_RELEASE_TABLET | Freq: Every day | ORAL | 3 refills | Status: DC
Start: 2023-11-09 — End: 2024-09-07

## 2023-11-09 NOTE — Patient Instructions (Addendum)
We will give you a call to get scheduled for a 6 month follow up.    Please take your proton pump inhibitor medication, pantoprazole daily Please take this medication 30 minutes to 1 hour before meals- this makes it more effective.  Avoid spicy and acidic foods Avoid fatty foods Limit your intake of coffee, tea, alcohol, and carbonated drinks Work to maintain a healthy weight Keep the head of the bed elevated at least 3 inches with blocks or a wedge pillow if you are having any nighttime symptoms Stay upright for 2 hours after eating Avoid meals and snacks three to four hours before bedtime    A bowel blockage, also called an obstruction, can prevent gas, fluids, or food from moving through the intestines normally. It can cause constipation and, rarely, diarrhea. You may have pain, nausea, vomiting, and cramping.  Most of the time, complete blockages require a stay in the hospital and possibly surgery. But if your bowel is only partly blocked, your doctor may tell you to wait until it clears on its own and you are able to pass gas and stool. If so, there are things you can do at home to help make you feel better.  What can you do to prevent a bowel obstruction? -Try eating smaller meals more often throughout the day.  -Chew your food very well. Try to chew each bite until it is liquid.  -Avoid high-fiber foods and raw fruits and vegetables. These may cause another blockage. -Drinking plenty of water may help. If you have kidney, heart, or liver disease and have to limit fluids, talk with your doctor before you increase the amount of fluids you drink. Your doctor may ask that you drink high-calorie liquid formulas if your symptoms require them.  -Be on a low fiber/residue diet. You should check with your doctor before eating whole-grain products or using a fiber supplement such as Citrucel or Metamucil. -Try to get at least 30 minutes of physical activity on most days of the week. Walking is  a good choice.  Call your doctor now or seek immediate medical care if:  You have a fever. You are vomiting. You have new or worse belly pain. You cannot pass stools or gas. Watch closely for changes in your health, and be sure to contact your doctor if you have any problems.  Low-Fiber Eating Plan Fiber is found in fruits, vegetables, whole grains, and beans. Eating a diet low in fiber helps you poop less often. A low-fiber eating plan may help your digestive system heal if: You have certain conditions, such as Crohn's disease, diverticulitis, or irritable bowel syndrome (IBS), and are having a flare-up. You have had radiation therapy on your pelvis or bowel. You have had surgery on your intestines. You have a new surgical opening in your abdomen called a colostomy or ileostomy. You have an intestine that has narrowed. Your health care provider will tell you how long to stay on this diet. You may find it helpful to work with a dietitian to plan your meals. What are tips for following this plan? Reading food labels  Check the nutrition facts label on food products for the amount of dietary fiber. Choose foods that have less than 2 grams (g) of fiber per serving. General information Eat 5-6 small meals throughout the day instead of 3 large meals. Follow instructions from your provider about how much fiber you should have each day and for how long you should follow a low fiber  diet. Most people on a low-fiber eating plan should eat less than 10 g of fiber a day.  What foods should I eat? Fruits Soft-cooked or canned fruits without skin and seeds. Ripe banana. Applesauce. Fruit juice without pulp. Vegetables Well-cooked or canned vegetables without skin, seeds, or stems. Cooked potatoes without skins. Vegetable juice. Grains All bread and crackers made with white flour. Waffles, pancakes, and [place] toast. Bagels. Pretzels. Melba toast, zwieback, and matzoh. Cooked and dried cereals  that do not have whole grains, added fiber, seeds, or dried fruit. Denzil Magnuson. Hot and cold cereals made with refined corn, rice, or oats. Plain pasta and noodles. White rice. Meats and other proteins Ground meat. Tender cuts of meat or poultry. Eggs. Fish, seafood, and shellfish. Smooth nut butters. Tofu. Dairy All milk products and drinks. Lactose-free milk, including rice, soy, and almond milk. Yogurt without fruit, nuts, chocolate, or granola mixed in. Sour cream. Cottage cheese. Cheese. Fats and oils Olive oil, canola oil, sunflower oil, flaxseed oil, avocado oil, and grapeseed oil. Mayonnaise. Cream cheese. Margarine. Butter. Beverages Decaf coffee. Fruit and vegetable juices. Smoothies in small amounts, with no pulp or skins, and with fruits from the recommended list. Sports drinks. Herbal tea. Water. Sweets and desserts Plain cakes. Cookies. Cream pies and pies made with recommended fruits. Pudding. Custard. Fruit gelatin. Sherbet. Ice pops. Ice cream without nuts. Hard candy. Honey. Jelly. Molasses. Syrups. Chocolate. Marshmallows. Gumdrops. Seasonings and condiments Ketchup. Mild mustard. Mild salad dressings. Plain gravies. Vinegar. Spices in moderation. Salt. Sugar. Other foods Bouillon. Broth. Cream and strained soups made from recommended foods. Casseroles made with recommended foods. The items listed above may not be all the foods and drinks you can have. Talk to a dietitian to learn more. What foods should I avoid? Fruits Raw or dried fruit. Berries. Fruit juice with pulp. Prune juice. Vegetables Potato skins. Raw or undercooked vegetables. All beans and bean sprouts. Cooked greens. Corn. Peas. Cabbage. Beets. Broccoli. Brussels sprouts. Cauliflower. Mushrooms. Onions. Peppers. Parsnips. Okra. Sauerkraut. Grains Whole-wheat, whole-grain, or multigrain breads, cereals, or crackers. Rye bread. Cereals with nuts, raisins, or coconut. Bran. Granola. High-fiber cereals. Cornmeal or corn  bread. Whole-grain pasta. Wild or brown rice. Quinoa. Popcorn. Buckwheat. Wheat germ. Meats and other proteins Tough, fibrous meats with gristle. Fatty meat. Poultry with skin. Fried meat, Environmental education officer, or fish. Precooked or cured meat, such as sausages or meat loaves. Tomasa Blase. Hot dogs. Nuts and chunky nut butter. Dried peas, beans, and lentils. Hummus. Dairy Yogurt with fruit, nuts, chocolate, or granola mixed in. Full-fat dairy such as whole milk, ice cream, or sour cream. Beverages Caffeinated coffee and teas. Fats and oils Avocado. Coconut. Butter. Sweets and desserts Desserts, cookies, or candies that contain nuts or coconut. Dried fruit. Jams and preserves with seeds. Marmalade. Any dessert made with fruits or grains that are not recommended. Seasonings and condiments Relish. Horseradish. Rosita Fire. Olives. Other foods Corn tortilla chips. Soups made with vegetables or grains that are not recommended. The items listed above may not be all the foods and drinks you should avoid. Talk to a dietitian to learn more. This information is not intended to replace advice given to you by your health care provider. Make sure you discuss any questions you have with your health care provider. Document Revised: 03/09/2023 Document Reviewed: 03/09/2023 Elsevier Patient Education  2024 [employer]  _______________________________________________________  If your blood pressure at your visit was 140/90 or greater, please contact your primary care physician to follow up on this.  _______________________________________________________  If you are age 42 or older, your body mass index should be between 23-30. Your Body mass index is 31.93 kg/m. If this is out of the aforementioned range listed, please consider follow up with your Primary Care Provider.  If you are age 52 or younger, your body mass index should be between 19-25. Your Body mass index is 31.93 kg/m. If this is out of the aformentioned range  listed, please consider follow up with your Primary Care Provider.   ________________________________________________________  The [facility] providers would like to encourage you to use [facility] to communicate with providers for non-urgent requests or questions.  Due to long hold times on the telephone, sending your provider a message by [facility] may be a faster and more efficient way to get a response.  Please allow 48 business hours for a response.  Please remember that this is for non-urgent requests.  _______________________________________________________ It was a pleasure to see you today!  Thank you for trusting me with your gastrointestinal care!

## 2023-11-10 NOTE — Progress Notes (Signed)
Addendum: Reviewed and agree with assessment and management plan. Kadijah Shamoon M, MD  

## 2024-01-08 DIAGNOSIS — T161XXA Foreign body in right ear, initial encounter: Secondary | ICD-10-CM | POA: Diagnosis not present

## 2024-01-08 DIAGNOSIS — H9201 Otalgia, right ear: Secondary | ICD-10-CM | POA: Diagnosis not present

## 2024-01-08 DIAGNOSIS — L659 Nonscarring hair loss, unspecified: Secondary | ICD-10-CM | POA: Diagnosis not present

## 2024-01-12 DIAGNOSIS — R2689 Other abnormalities of gait and mobility: Secondary | ICD-10-CM | POA: Diagnosis not present

## 2024-01-12 DIAGNOSIS — M170 Bilateral primary osteoarthritis of knee: Secondary | ICD-10-CM | POA: Diagnosis not present

## 2024-01-27 DIAGNOSIS — R2681 Unsteadiness on feet: Secondary | ICD-10-CM | POA: Diagnosis not present

## 2024-01-27 DIAGNOSIS — M25561 Pain in right knee: Secondary | ICD-10-CM | POA: Diagnosis not present

## 2024-01-27 DIAGNOSIS — M25562 Pain in left knee: Secondary | ICD-10-CM | POA: Diagnosis not present

## 2024-01-27 DIAGNOSIS — M6259 Muscle wasting and atrophy, not elsewhere classified, multiple sites: Secondary | ICD-10-CM | POA: Diagnosis not present

## 2024-02-02 DIAGNOSIS — M6259 Muscle wasting and atrophy, not elsewhere classified, multiple sites: Secondary | ICD-10-CM | POA: Diagnosis not present

## 2024-02-02 DIAGNOSIS — M25562 Pain in left knee: Secondary | ICD-10-CM | POA: Diagnosis not present

## 2024-02-02 DIAGNOSIS — M25561 Pain in right knee: Secondary | ICD-10-CM | POA: Diagnosis not present

## 2024-02-02 DIAGNOSIS — R2681 Unsteadiness on feet: Secondary | ICD-10-CM | POA: Diagnosis not present

## 2024-02-09 DIAGNOSIS — M6259 Muscle wasting and atrophy, not elsewhere classified, multiple sites: Secondary | ICD-10-CM | POA: Diagnosis not present

## 2024-02-09 DIAGNOSIS — R2681 Unsteadiness on feet: Secondary | ICD-10-CM | POA: Diagnosis not present

## 2024-02-09 DIAGNOSIS — M25561 Pain in right knee: Secondary | ICD-10-CM | POA: Diagnosis not present

## 2024-02-09 DIAGNOSIS — M25562 Pain in left knee: Secondary | ICD-10-CM | POA: Diagnosis not present

## 2024-02-11 DIAGNOSIS — R2681 Unsteadiness on feet: Secondary | ICD-10-CM | POA: Diagnosis not present

## 2024-02-11 DIAGNOSIS — M25561 Pain in right knee: Secondary | ICD-10-CM | POA: Diagnosis not present

## 2024-02-11 DIAGNOSIS — M6259 Muscle wasting and atrophy, not elsewhere classified, multiple sites: Secondary | ICD-10-CM | POA: Diagnosis not present

## 2024-02-11 DIAGNOSIS — M25562 Pain in left knee: Secondary | ICD-10-CM | POA: Diagnosis not present

## 2024-02-16 DIAGNOSIS — M25561 Pain in right knee: Secondary | ICD-10-CM | POA: Diagnosis not present

## 2024-02-16 DIAGNOSIS — R2681 Unsteadiness on feet: Secondary | ICD-10-CM | POA: Diagnosis not present

## 2024-02-16 DIAGNOSIS — M6259 Muscle wasting and atrophy, not elsewhere classified, multiple sites: Secondary | ICD-10-CM | POA: Diagnosis not present

## 2024-02-16 DIAGNOSIS — M25562 Pain in left knee: Secondary | ICD-10-CM | POA: Diagnosis not present

## 2024-02-23 DIAGNOSIS — M25562 Pain in left knee: Secondary | ICD-10-CM | POA: Diagnosis not present

## 2024-02-23 DIAGNOSIS — M25561 Pain in right knee: Secondary | ICD-10-CM | POA: Diagnosis not present

## 2024-02-23 DIAGNOSIS — R2681 Unsteadiness on feet: Secondary | ICD-10-CM | POA: Diagnosis not present

## 2024-02-23 DIAGNOSIS — M6259 Muscle wasting and atrophy, not elsewhere classified, multiple sites: Secondary | ICD-10-CM | POA: Diagnosis not present

## 2024-02-25 DIAGNOSIS — R2681 Unsteadiness on feet: Secondary | ICD-10-CM | POA: Diagnosis not present

## 2024-02-25 DIAGNOSIS — M25562 Pain in left knee: Secondary | ICD-10-CM | POA: Diagnosis not present

## 2024-02-25 DIAGNOSIS — M25561 Pain in right knee: Secondary | ICD-10-CM | POA: Diagnosis not present

## 2024-02-25 DIAGNOSIS — M6259 Muscle wasting and atrophy, not elsewhere classified, multiple sites: Secondary | ICD-10-CM | POA: Diagnosis not present

## 2024-03-02 DIAGNOSIS — R062 Wheezing: Secondary | ICD-10-CM | POA: Diagnosis not present

## 2024-03-02 DIAGNOSIS — R0602 Shortness of breath: Secondary | ICD-10-CM | POA: Diagnosis not present

## 2024-03-02 DIAGNOSIS — K581 Irritable bowel syndrome with constipation: Secondary | ICD-10-CM | POA: Diagnosis not present

## 2024-03-02 DIAGNOSIS — I129 Hypertensive chronic kidney disease with stage 1 through stage 4 chronic kidney disease, or unspecified chronic kidney disease: Secondary | ICD-10-CM | POA: Diagnosis not present

## 2024-03-02 DIAGNOSIS — R051 Acute cough: Secondary | ICD-10-CM | POA: Diagnosis not present

## 2024-03-02 DIAGNOSIS — N1831 Chronic kidney disease, stage 3a: Secondary | ICD-10-CM | POA: Diagnosis not present

## 2024-03-02 DIAGNOSIS — E1122 Type 2 diabetes mellitus with diabetic chronic kidney disease: Secondary | ICD-10-CM | POA: Diagnosis not present

## 2024-03-02 DIAGNOSIS — J189 Pneumonia, unspecified organism: Secondary | ICD-10-CM | POA: Diagnosis not present

## 2024-03-02 DIAGNOSIS — K5900 Constipation, unspecified: Secondary | ICD-10-CM | POA: Diagnosis not present

## 2024-03-02 DIAGNOSIS — R54 Age-related physical debility: Secondary | ICD-10-CM | POA: Diagnosis not present

## 2024-03-14 DIAGNOSIS — E1122 Type 2 diabetes mellitus with diabetic chronic kidney disease: Secondary | ICD-10-CM | POA: Diagnosis not present

## 2024-03-14 DIAGNOSIS — K5900 Constipation, unspecified: Secondary | ICD-10-CM | POA: Diagnosis not present

## 2024-03-14 DIAGNOSIS — I129 Hypertensive chronic kidney disease with stage 1 through stage 4 chronic kidney disease, or unspecified chronic kidney disease: Secondary | ICD-10-CM | POA: Diagnosis not present

## 2024-03-14 DIAGNOSIS — D649 Anemia, unspecified: Secondary | ICD-10-CM | POA: Diagnosis not present

## 2024-03-14 DIAGNOSIS — L659 Nonscarring hair loss, unspecified: Secondary | ICD-10-CM | POA: Diagnosis not present

## 2024-03-14 DIAGNOSIS — E781 Pure hyperglyceridemia: Secondary | ICD-10-CM | POA: Diagnosis not present

## 2024-03-15 DIAGNOSIS — M25562 Pain in left knee: Secondary | ICD-10-CM | POA: Diagnosis not present

## 2024-03-15 DIAGNOSIS — R2681 Unsteadiness on feet: Secondary | ICD-10-CM | POA: Diagnosis not present

## 2024-03-15 DIAGNOSIS — M6259 Muscle wasting and atrophy, not elsewhere classified, multiple sites: Secondary | ICD-10-CM | POA: Diagnosis not present

## 2024-03-15 DIAGNOSIS — M25561 Pain in right knee: Secondary | ICD-10-CM | POA: Diagnosis not present

## 2024-03-17 DIAGNOSIS — M25562 Pain in left knee: Secondary | ICD-10-CM | POA: Diagnosis not present

## 2024-03-17 DIAGNOSIS — M25561 Pain in right knee: Secondary | ICD-10-CM | POA: Diagnosis not present

## 2024-03-17 DIAGNOSIS — M6259 Muscle wasting and atrophy, not elsewhere classified, multiple sites: Secondary | ICD-10-CM | POA: Diagnosis not present

## 2024-03-17 DIAGNOSIS — R2681 Unsteadiness on feet: Secondary | ICD-10-CM | POA: Diagnosis not present

## 2024-03-22 DIAGNOSIS — M6259 Muscle wasting and atrophy, not elsewhere classified, multiple sites: Secondary | ICD-10-CM | POA: Diagnosis not present

## 2024-03-22 DIAGNOSIS — R2681 Unsteadiness on feet: Secondary | ICD-10-CM | POA: Diagnosis not present

## 2024-03-22 DIAGNOSIS — M25561 Pain in right knee: Secondary | ICD-10-CM | POA: Diagnosis not present

## 2024-03-22 DIAGNOSIS — M25562 Pain in left knee: Secondary | ICD-10-CM | POA: Diagnosis not present

## 2024-03-24 DIAGNOSIS — M25561 Pain in right knee: Secondary | ICD-10-CM | POA: Diagnosis not present

## 2024-03-24 DIAGNOSIS — M6259 Muscle wasting and atrophy, not elsewhere classified, multiple sites: Secondary | ICD-10-CM | POA: Diagnosis not present

## 2024-03-24 DIAGNOSIS — M25562 Pain in left knee: Secondary | ICD-10-CM | POA: Diagnosis not present

## 2024-03-24 DIAGNOSIS — R2681 Unsteadiness on feet: Secondary | ICD-10-CM | POA: Diagnosis not present

## 2024-03-28 DIAGNOSIS — K5900 Constipation, unspecified: Secondary | ICD-10-CM | POA: Diagnosis not present

## 2024-03-28 DIAGNOSIS — E669 Obesity, unspecified: Secondary | ICD-10-CM | POA: Diagnosis not present

## 2024-03-28 DIAGNOSIS — R82998 Other abnormal findings in urine: Secondary | ICD-10-CM | POA: Diagnosis not present

## 2024-03-28 DIAGNOSIS — N1831 Chronic kidney disease, stage 3a: Secondary | ICD-10-CM | POA: Diagnosis not present

## 2024-03-28 DIAGNOSIS — Z0000 Encounter for general adult medical examination without abnormal findings: Secondary | ICD-10-CM | POA: Diagnosis not present

## 2024-03-28 DIAGNOSIS — E871 Hypo-osmolality and hyponatremia: Secondary | ICD-10-CM | POA: Diagnosis not present

## 2024-03-28 DIAGNOSIS — K581 Irritable bowel syndrome with constipation: Secondary | ICD-10-CM | POA: Diagnosis not present

## 2024-03-28 DIAGNOSIS — I700 Atherosclerosis of aorta: Secondary | ICD-10-CM | POA: Diagnosis not present

## 2024-03-28 DIAGNOSIS — R413 Other amnesia: Secondary | ICD-10-CM | POA: Diagnosis not present

## 2024-03-28 DIAGNOSIS — I129 Hypertensive chronic kidney disease with stage 1 through stage 4 chronic kidney disease, or unspecified chronic kidney disease: Secondary | ICD-10-CM | POA: Diagnosis not present

## 2024-03-28 DIAGNOSIS — F411 Generalized anxiety disorder: Secondary | ICD-10-CM | POA: Diagnosis not present

## 2024-03-28 DIAGNOSIS — E781 Pure hyperglyceridemia: Secondary | ICD-10-CM | POA: Diagnosis not present

## 2024-03-28 DIAGNOSIS — E1122 Type 2 diabetes mellitus with diabetic chronic kidney disease: Secondary | ICD-10-CM | POA: Diagnosis not present

## 2024-03-29 DIAGNOSIS — M25561 Pain in right knee: Secondary | ICD-10-CM | POA: Diagnosis not present

## 2024-03-29 DIAGNOSIS — M6259 Muscle wasting and atrophy, not elsewhere classified, multiple sites: Secondary | ICD-10-CM | POA: Diagnosis not present

## 2024-03-29 DIAGNOSIS — M25562 Pain in left knee: Secondary | ICD-10-CM | POA: Diagnosis not present

## 2024-03-29 DIAGNOSIS — R2681 Unsteadiness on feet: Secondary | ICD-10-CM | POA: Diagnosis not present

## 2024-03-31 DIAGNOSIS — M6259 Muscle wasting and atrophy, not elsewhere classified, multiple sites: Secondary | ICD-10-CM | POA: Diagnosis not present

## 2024-03-31 DIAGNOSIS — M25562 Pain in left knee: Secondary | ICD-10-CM | POA: Diagnosis not present

## 2024-03-31 DIAGNOSIS — R2681 Unsteadiness on feet: Secondary | ICD-10-CM | POA: Diagnosis not present

## 2024-03-31 DIAGNOSIS — M25561 Pain in right knee: Secondary | ICD-10-CM | POA: Diagnosis not present

## 2024-04-05 DIAGNOSIS — M25561 Pain in right knee: Secondary | ICD-10-CM | POA: Diagnosis not present

## 2024-04-05 DIAGNOSIS — R2681 Unsteadiness on feet: Secondary | ICD-10-CM | POA: Diagnosis not present

## 2024-04-05 DIAGNOSIS — M25562 Pain in left knee: Secondary | ICD-10-CM | POA: Diagnosis not present

## 2024-04-05 DIAGNOSIS — M6259 Muscle wasting and atrophy, not elsewhere classified, multiple sites: Secondary | ICD-10-CM | POA: Diagnosis not present

## 2024-04-07 DIAGNOSIS — M25562 Pain in left knee: Secondary | ICD-10-CM | POA: Diagnosis not present

## 2024-04-07 DIAGNOSIS — M25561 Pain in right knee: Secondary | ICD-10-CM | POA: Diagnosis not present

## 2024-04-07 DIAGNOSIS — M6259 Muscle wasting and atrophy, not elsewhere classified, multiple sites: Secondary | ICD-10-CM | POA: Diagnosis not present

## 2024-04-07 DIAGNOSIS — R2681 Unsteadiness on feet: Secondary | ICD-10-CM | POA: Diagnosis not present

## 2024-04-11 DIAGNOSIS — M6259 Muscle wasting and atrophy, not elsewhere classified, multiple sites: Secondary | ICD-10-CM | POA: Diagnosis not present

## 2024-04-11 DIAGNOSIS — M25562 Pain in left knee: Secondary | ICD-10-CM | POA: Diagnosis not present

## 2024-04-11 DIAGNOSIS — R2681 Unsteadiness on feet: Secondary | ICD-10-CM | POA: Diagnosis not present

## 2024-04-11 DIAGNOSIS — M25561 Pain in right knee: Secondary | ICD-10-CM | POA: Diagnosis not present

## 2024-04-12 DIAGNOSIS — M170 Bilateral primary osteoarthritis of knee: Secondary | ICD-10-CM | POA: Diagnosis not present

## 2024-04-19 DIAGNOSIS — M25562 Pain in left knee: Secondary | ICD-10-CM | POA: Diagnosis not present

## 2024-04-19 DIAGNOSIS — R2681 Unsteadiness on feet: Secondary | ICD-10-CM | POA: Diagnosis not present

## 2024-04-19 DIAGNOSIS — M25561 Pain in right knee: Secondary | ICD-10-CM | POA: Diagnosis not present

## 2024-04-19 DIAGNOSIS — M6259 Muscle wasting and atrophy, not elsewhere classified, multiple sites: Secondary | ICD-10-CM | POA: Diagnosis not present

## 2024-04-26 DIAGNOSIS — M25561 Pain in right knee: Secondary | ICD-10-CM | POA: Diagnosis not present

## 2024-04-26 DIAGNOSIS — R2681 Unsteadiness on feet: Secondary | ICD-10-CM | POA: Diagnosis not present

## 2024-04-26 DIAGNOSIS — M25562 Pain in left knee: Secondary | ICD-10-CM | POA: Diagnosis not present

## 2024-04-26 DIAGNOSIS — M6259 Muscle wasting and atrophy, not elsewhere classified, multiple sites: Secondary | ICD-10-CM | POA: Diagnosis not present

## 2024-04-28 DIAGNOSIS — M6259 Muscle wasting and atrophy, not elsewhere classified, multiple sites: Secondary | ICD-10-CM | POA: Diagnosis not present

## 2024-04-28 DIAGNOSIS — M25561 Pain in right knee: Secondary | ICD-10-CM | POA: Diagnosis not present

## 2024-04-28 DIAGNOSIS — M25562 Pain in left knee: Secondary | ICD-10-CM | POA: Diagnosis not present

## 2024-04-28 DIAGNOSIS — R2681 Unsteadiness on feet: Secondary | ICD-10-CM | POA: Diagnosis not present

## 2024-05-04 DIAGNOSIS — R2681 Unsteadiness on feet: Secondary | ICD-10-CM | POA: Diagnosis not present

## 2024-05-04 DIAGNOSIS — M25562 Pain in left knee: Secondary | ICD-10-CM | POA: Diagnosis not present

## 2024-05-04 DIAGNOSIS — M6259 Muscle wasting and atrophy, not elsewhere classified, multiple sites: Secondary | ICD-10-CM | POA: Diagnosis not present

## 2024-05-04 DIAGNOSIS — M25561 Pain in right knee: Secondary | ICD-10-CM | POA: Diagnosis not present

## 2024-05-05 DIAGNOSIS — I129 Hypertensive chronic kidney disease with stage 1 through stage 4 chronic kidney disease, or unspecified chronic kidney disease: Secondary | ICD-10-CM | POA: Diagnosis not present

## 2024-05-05 DIAGNOSIS — N329 Bladder disorder, unspecified: Secondary | ICD-10-CM | POA: Diagnosis not present

## 2024-05-05 DIAGNOSIS — M6259 Muscle wasting and atrophy, not elsewhere classified, multiple sites: Secondary | ICD-10-CM | POA: Diagnosis not present

## 2024-05-05 DIAGNOSIS — M25562 Pain in left knee: Secondary | ICD-10-CM | POA: Diagnosis not present

## 2024-05-05 DIAGNOSIS — M179 Osteoarthritis of knee, unspecified: Secondary | ICD-10-CM | POA: Diagnosis not present

## 2024-05-05 DIAGNOSIS — R2681 Unsteadiness on feet: Secondary | ICD-10-CM | POA: Diagnosis not present

## 2024-05-05 DIAGNOSIS — Z66 Do not resuscitate: Secondary | ICD-10-CM | POA: Diagnosis not present

## 2024-05-05 DIAGNOSIS — K862 Cyst of pancreas: Secondary | ICD-10-CM | POA: Diagnosis not present

## 2024-05-05 DIAGNOSIS — M25561 Pain in right knee: Secondary | ICD-10-CM | POA: Diagnosis not present

## 2024-05-05 DIAGNOSIS — K581 Irritable bowel syndrome with constipation: Secondary | ICD-10-CM | POA: Diagnosis not present

## 2024-05-05 DIAGNOSIS — N1831 Chronic kidney disease, stage 3a: Secondary | ICD-10-CM | POA: Diagnosis not present

## 2024-05-05 DIAGNOSIS — E1122 Type 2 diabetes mellitus with diabetic chronic kidney disease: Secondary | ICD-10-CM | POA: Diagnosis not present

## 2024-05-10 DIAGNOSIS — R2681 Unsteadiness on feet: Secondary | ICD-10-CM | POA: Diagnosis not present

## 2024-05-10 DIAGNOSIS — M25561 Pain in right knee: Secondary | ICD-10-CM | POA: Diagnosis not present

## 2024-05-10 DIAGNOSIS — M25562 Pain in left knee: Secondary | ICD-10-CM | POA: Diagnosis not present

## 2024-05-10 DIAGNOSIS — M6259 Muscle wasting and atrophy, not elsewhere classified, multiple sites: Secondary | ICD-10-CM | POA: Diagnosis not present

## 2024-05-12 DIAGNOSIS — M6259 Muscle wasting and atrophy, not elsewhere classified, multiple sites: Secondary | ICD-10-CM | POA: Diagnosis not present

## 2024-05-12 DIAGNOSIS — R2681 Unsteadiness on feet: Secondary | ICD-10-CM | POA: Diagnosis not present

## 2024-05-12 DIAGNOSIS — M25562 Pain in left knee: Secondary | ICD-10-CM | POA: Diagnosis not present

## 2024-05-12 DIAGNOSIS — M25561 Pain in right knee: Secondary | ICD-10-CM | POA: Diagnosis not present

## 2024-05-18 DIAGNOSIS — M25561 Pain in right knee: Secondary | ICD-10-CM | POA: Diagnosis not present

## 2024-05-18 DIAGNOSIS — M6259 Muscle wasting and atrophy, not elsewhere classified, multiple sites: Secondary | ICD-10-CM | POA: Diagnosis not present

## 2024-05-18 DIAGNOSIS — R2681 Unsteadiness on feet: Secondary | ICD-10-CM | POA: Diagnosis not present

## 2024-05-18 DIAGNOSIS — M25562 Pain in left knee: Secondary | ICD-10-CM | POA: Diagnosis not present

## 2024-05-24 DIAGNOSIS — R2681 Unsteadiness on feet: Secondary | ICD-10-CM | POA: Diagnosis not present

## 2024-05-24 DIAGNOSIS — M25562 Pain in left knee: Secondary | ICD-10-CM | POA: Diagnosis not present

## 2024-05-24 DIAGNOSIS — M25561 Pain in right knee: Secondary | ICD-10-CM | POA: Diagnosis not present

## 2024-05-24 DIAGNOSIS — M6259 Muscle wasting and atrophy, not elsewhere classified, multiple sites: Secondary | ICD-10-CM | POA: Diagnosis not present

## 2024-05-26 DIAGNOSIS — R2681 Unsteadiness on feet: Secondary | ICD-10-CM | POA: Diagnosis not present

## 2024-05-26 DIAGNOSIS — M6259 Muscle wasting and atrophy, not elsewhere classified, multiple sites: Secondary | ICD-10-CM | POA: Diagnosis not present

## 2024-05-26 DIAGNOSIS — M25561 Pain in right knee: Secondary | ICD-10-CM | POA: Diagnosis not present

## 2024-05-26 DIAGNOSIS — M25562 Pain in left knee: Secondary | ICD-10-CM | POA: Diagnosis not present

## 2024-06-03 DIAGNOSIS — M6259 Muscle wasting and atrophy, not elsewhere classified, multiple sites: Secondary | ICD-10-CM | POA: Diagnosis not present

## 2024-06-03 DIAGNOSIS — M25561 Pain in right knee: Secondary | ICD-10-CM | POA: Diagnosis not present

## 2024-06-03 DIAGNOSIS — R2681 Unsteadiness on feet: Secondary | ICD-10-CM | POA: Diagnosis not present

## 2024-06-03 DIAGNOSIS — M25562 Pain in left knee: Secondary | ICD-10-CM | POA: Diagnosis not present

## 2024-06-07 DIAGNOSIS — M25562 Pain in left knee: Secondary | ICD-10-CM | POA: Diagnosis not present

## 2024-06-07 DIAGNOSIS — M6259 Muscle wasting and atrophy, not elsewhere classified, multiple sites: Secondary | ICD-10-CM | POA: Diagnosis not present

## 2024-06-07 DIAGNOSIS — R2681 Unsteadiness on feet: Secondary | ICD-10-CM | POA: Diagnosis not present

## 2024-06-07 DIAGNOSIS — M25561 Pain in right knee: Secondary | ICD-10-CM | POA: Diagnosis not present

## 2024-06-10 DIAGNOSIS — M6259 Muscle wasting and atrophy, not elsewhere classified, multiple sites: Secondary | ICD-10-CM | POA: Diagnosis not present

## 2024-06-10 DIAGNOSIS — M25561 Pain in right knee: Secondary | ICD-10-CM | POA: Diagnosis not present

## 2024-06-10 DIAGNOSIS — M25562 Pain in left knee: Secondary | ICD-10-CM | POA: Diagnosis not present

## 2024-06-10 DIAGNOSIS — R2681 Unsteadiness on feet: Secondary | ICD-10-CM | POA: Diagnosis not present

## 2024-06-14 DIAGNOSIS — R2681 Unsteadiness on feet: Secondary | ICD-10-CM | POA: Diagnosis not present

## 2024-06-14 DIAGNOSIS — M25562 Pain in left knee: Secondary | ICD-10-CM | POA: Diagnosis not present

## 2024-06-14 DIAGNOSIS — M25561 Pain in right knee: Secondary | ICD-10-CM | POA: Diagnosis not present

## 2024-06-14 DIAGNOSIS — M6259 Muscle wasting and atrophy, not elsewhere classified, multiple sites: Secondary | ICD-10-CM | POA: Diagnosis not present

## 2024-06-16 DIAGNOSIS — R2681 Unsteadiness on feet: Secondary | ICD-10-CM | POA: Diagnosis not present

## 2024-06-16 DIAGNOSIS — M6259 Muscle wasting and atrophy, not elsewhere classified, multiple sites: Secondary | ICD-10-CM | POA: Diagnosis not present

## 2024-06-16 DIAGNOSIS — M25561 Pain in right knee: Secondary | ICD-10-CM | POA: Diagnosis not present

## 2024-06-16 DIAGNOSIS — M25562 Pain in left knee: Secondary | ICD-10-CM | POA: Diagnosis not present

## 2024-06-21 DIAGNOSIS — M25561 Pain in right knee: Secondary | ICD-10-CM | POA: Diagnosis not present

## 2024-06-21 DIAGNOSIS — M6259 Muscle wasting and atrophy, not elsewhere classified, multiple sites: Secondary | ICD-10-CM | POA: Diagnosis not present

## 2024-06-21 DIAGNOSIS — R2681 Unsteadiness on feet: Secondary | ICD-10-CM | POA: Diagnosis not present

## 2024-06-21 DIAGNOSIS — M25562 Pain in left knee: Secondary | ICD-10-CM | POA: Diagnosis not present

## 2024-06-24 DIAGNOSIS — M25561 Pain in right knee: Secondary | ICD-10-CM | POA: Diagnosis not present

## 2024-06-24 DIAGNOSIS — M25562 Pain in left knee: Secondary | ICD-10-CM | POA: Diagnosis not present

## 2024-06-24 DIAGNOSIS — M6259 Muscle wasting and atrophy, not elsewhere classified, multiple sites: Secondary | ICD-10-CM | POA: Diagnosis not present

## 2024-06-24 DIAGNOSIS — R2681 Unsteadiness on feet: Secondary | ICD-10-CM | POA: Diagnosis not present

## 2024-06-29 DIAGNOSIS — R2681 Unsteadiness on feet: Secondary | ICD-10-CM | POA: Diagnosis not present

## 2024-06-29 DIAGNOSIS — M6259 Muscle wasting and atrophy, not elsewhere classified, multiple sites: Secondary | ICD-10-CM | POA: Diagnosis not present

## 2024-06-29 DIAGNOSIS — M25561 Pain in right knee: Secondary | ICD-10-CM | POA: Diagnosis not present

## 2024-06-29 DIAGNOSIS — M25562 Pain in left knee: Secondary | ICD-10-CM | POA: Diagnosis not present

## 2024-07-01 DIAGNOSIS — R2681 Unsteadiness on feet: Secondary | ICD-10-CM | POA: Diagnosis not present

## 2024-07-01 DIAGNOSIS — M6259 Muscle wasting and atrophy, not elsewhere classified, multiple sites: Secondary | ICD-10-CM | POA: Diagnosis not present

## 2024-07-01 DIAGNOSIS — M25561 Pain in right knee: Secondary | ICD-10-CM | POA: Diagnosis not present

## 2024-07-01 DIAGNOSIS — M25562 Pain in left knee: Secondary | ICD-10-CM | POA: Diagnosis not present

## 2024-07-05 DIAGNOSIS — M25562 Pain in left knee: Secondary | ICD-10-CM | POA: Diagnosis not present

## 2024-07-05 DIAGNOSIS — M6259 Muscle wasting and atrophy, not elsewhere classified, multiple sites: Secondary | ICD-10-CM | POA: Diagnosis not present

## 2024-07-05 DIAGNOSIS — M25561 Pain in right knee: Secondary | ICD-10-CM | POA: Diagnosis not present

## 2024-07-05 DIAGNOSIS — R2681 Unsteadiness on feet: Secondary | ICD-10-CM | POA: Diagnosis not present

## 2024-07-06 DIAGNOSIS — M170 Bilateral primary osteoarthritis of knee: Secondary | ICD-10-CM | POA: Diagnosis not present

## 2024-07-08 DIAGNOSIS — M25561 Pain in right knee: Secondary | ICD-10-CM | POA: Diagnosis not present

## 2024-07-08 DIAGNOSIS — R2681 Unsteadiness on feet: Secondary | ICD-10-CM | POA: Diagnosis not present

## 2024-07-08 DIAGNOSIS — M6259 Muscle wasting and atrophy, not elsewhere classified, multiple sites: Secondary | ICD-10-CM | POA: Diagnosis not present

## 2024-07-08 DIAGNOSIS — M25562 Pain in left knee: Secondary | ICD-10-CM | POA: Diagnosis not present

## 2024-07-13 DIAGNOSIS — M6259 Muscle wasting and atrophy, not elsewhere classified, multiple sites: Secondary | ICD-10-CM | POA: Diagnosis not present

## 2024-07-13 DIAGNOSIS — Z961 Presence of intraocular lens: Secondary | ICD-10-CM | POA: Diagnosis not present

## 2024-07-13 DIAGNOSIS — H26491 Other secondary cataract, right eye: Secondary | ICD-10-CM | POA: Diagnosis not present

## 2024-07-13 DIAGNOSIS — H353131 Nonexudative age-related macular degeneration, bilateral, early dry stage: Secondary | ICD-10-CM | POA: Diagnosis not present

## 2024-07-13 DIAGNOSIS — M25561 Pain in right knee: Secondary | ICD-10-CM | POA: Diagnosis not present

## 2024-07-13 DIAGNOSIS — R2681 Unsteadiness on feet: Secondary | ICD-10-CM | POA: Diagnosis not present

## 2024-07-13 DIAGNOSIS — M25562 Pain in left knee: Secondary | ICD-10-CM | POA: Diagnosis not present

## 2024-07-13 DIAGNOSIS — E119 Type 2 diabetes mellitus without complications: Secondary | ICD-10-CM | POA: Diagnosis not present

## 2024-07-15 DIAGNOSIS — M25562 Pain in left knee: Secondary | ICD-10-CM | POA: Diagnosis not present

## 2024-07-15 DIAGNOSIS — M25561 Pain in right knee: Secondary | ICD-10-CM | POA: Diagnosis not present

## 2024-07-15 DIAGNOSIS — M6259 Muscle wasting and atrophy, not elsewhere classified, multiple sites: Secondary | ICD-10-CM | POA: Diagnosis not present

## 2024-07-15 DIAGNOSIS — R2681 Unsteadiness on feet: Secondary | ICD-10-CM | POA: Diagnosis not present

## 2024-07-20 DIAGNOSIS — M6259 Muscle wasting and atrophy, not elsewhere classified, multiple sites: Secondary | ICD-10-CM | POA: Diagnosis not present

## 2024-07-20 DIAGNOSIS — M25561 Pain in right knee: Secondary | ICD-10-CM | POA: Diagnosis not present

## 2024-07-20 DIAGNOSIS — M25562 Pain in left knee: Secondary | ICD-10-CM | POA: Diagnosis not present

## 2024-07-20 DIAGNOSIS — R2681 Unsteadiness on feet: Secondary | ICD-10-CM | POA: Diagnosis not present

## 2024-07-21 DIAGNOSIS — M6259 Muscle wasting and atrophy, not elsewhere classified, multiple sites: Secondary | ICD-10-CM | POA: Diagnosis not present

## 2024-07-21 DIAGNOSIS — R2681 Unsteadiness on feet: Secondary | ICD-10-CM | POA: Diagnosis not present

## 2024-07-21 DIAGNOSIS — M25561 Pain in right knee: Secondary | ICD-10-CM | POA: Diagnosis not present

## 2024-07-21 DIAGNOSIS — M25562 Pain in left knee: Secondary | ICD-10-CM | POA: Diagnosis not present

## 2024-07-25 DIAGNOSIS — M25562 Pain in left knee: Secondary | ICD-10-CM | POA: Diagnosis not present

## 2024-07-25 DIAGNOSIS — R2681 Unsteadiness on feet: Secondary | ICD-10-CM | POA: Diagnosis not present

## 2024-07-25 DIAGNOSIS — M6259 Muscle wasting and atrophy, not elsewhere classified, multiple sites: Secondary | ICD-10-CM | POA: Diagnosis not present

## 2024-07-25 DIAGNOSIS — M25561 Pain in right knee: Secondary | ICD-10-CM | POA: Diagnosis not present

## 2024-08-02 DIAGNOSIS — M179 Osteoarthritis of knee, unspecified: Secondary | ICD-10-CM | POA: Diagnosis not present

## 2024-08-02 DIAGNOSIS — Z9181 History of falling: Secondary | ICD-10-CM | POA: Diagnosis not present

## 2024-08-02 DIAGNOSIS — R600 Localized edema: Secondary | ICD-10-CM | POA: Diagnosis not present

## 2024-08-10 DIAGNOSIS — R2681 Unsteadiness on feet: Secondary | ICD-10-CM | POA: Diagnosis not present

## 2024-08-10 DIAGNOSIS — M6259 Muscle wasting and atrophy, not elsewhere classified, multiple sites: Secondary | ICD-10-CM | POA: Diagnosis not present

## 2024-08-10 DIAGNOSIS — M25561 Pain in right knee: Secondary | ICD-10-CM | POA: Diagnosis not present

## 2024-08-10 DIAGNOSIS — M25562 Pain in left knee: Secondary | ICD-10-CM | POA: Diagnosis not present

## 2024-08-12 DIAGNOSIS — M25561 Pain in right knee: Secondary | ICD-10-CM | POA: Diagnosis not present

## 2024-08-12 DIAGNOSIS — M6259 Muscle wasting and atrophy, not elsewhere classified, multiple sites: Secondary | ICD-10-CM | POA: Diagnosis not present

## 2024-08-12 DIAGNOSIS — M25562 Pain in left knee: Secondary | ICD-10-CM | POA: Diagnosis not present

## 2024-08-12 DIAGNOSIS — R2681 Unsteadiness on feet: Secondary | ICD-10-CM | POA: Diagnosis not present

## 2024-08-17 DIAGNOSIS — R2681 Unsteadiness on feet: Secondary | ICD-10-CM | POA: Diagnosis not present

## 2024-08-17 DIAGNOSIS — M25562 Pain in left knee: Secondary | ICD-10-CM | POA: Diagnosis not present

## 2024-08-17 DIAGNOSIS — M25561 Pain in right knee: Secondary | ICD-10-CM | POA: Diagnosis not present

## 2024-08-17 DIAGNOSIS — M6259 Muscle wasting and atrophy, not elsewhere classified, multiple sites: Secondary | ICD-10-CM | POA: Diagnosis not present

## 2024-08-19 DIAGNOSIS — M25561 Pain in right knee: Secondary | ICD-10-CM | POA: Diagnosis not present

## 2024-08-19 DIAGNOSIS — M25562 Pain in left knee: Secondary | ICD-10-CM | POA: Diagnosis not present

## 2024-08-19 DIAGNOSIS — R2681 Unsteadiness on feet: Secondary | ICD-10-CM | POA: Diagnosis not present

## 2024-08-19 DIAGNOSIS — M6259 Muscle wasting and atrophy, not elsewhere classified, multiple sites: Secondary | ICD-10-CM | POA: Diagnosis not present

## 2024-08-24 DIAGNOSIS — M170 Bilateral primary osteoarthritis of knee: Secondary | ICD-10-CM | POA: Diagnosis not present

## 2024-08-30 DIAGNOSIS — M25561 Pain in right knee: Secondary | ICD-10-CM | POA: Diagnosis not present

## 2024-08-30 DIAGNOSIS — M25562 Pain in left knee: Secondary | ICD-10-CM | POA: Diagnosis not present

## 2024-08-30 DIAGNOSIS — R2681 Unsteadiness on feet: Secondary | ICD-10-CM | POA: Diagnosis not present

## 2024-08-30 DIAGNOSIS — M6259 Muscle wasting and atrophy, not elsewhere classified, multiple sites: Secondary | ICD-10-CM | POA: Diagnosis not present

## 2024-08-31 DIAGNOSIS — M170 Bilateral primary osteoarthritis of knee: Secondary | ICD-10-CM | POA: Diagnosis not present

## 2024-09-02 DIAGNOSIS — M25562 Pain in left knee: Secondary | ICD-10-CM | POA: Diagnosis not present

## 2024-09-02 DIAGNOSIS — M6259 Muscle wasting and atrophy, not elsewhere classified, multiple sites: Secondary | ICD-10-CM | POA: Diagnosis not present

## 2024-09-02 DIAGNOSIS — R2681 Unsteadiness on feet: Secondary | ICD-10-CM | POA: Diagnosis not present

## 2024-09-02 DIAGNOSIS — M25561 Pain in right knee: Secondary | ICD-10-CM | POA: Diagnosis not present

## 2024-09-05 DIAGNOSIS — M179 Osteoarthritis of knee, unspecified: Secondary | ICD-10-CM | POA: Diagnosis not present

## 2024-09-05 DIAGNOSIS — K5900 Constipation, unspecified: Secondary | ICD-10-CM | POA: Diagnosis not present

## 2024-09-05 DIAGNOSIS — Z23 Encounter for immunization: Secondary | ICD-10-CM | POA: Diagnosis not present

## 2024-09-05 DIAGNOSIS — E1122 Type 2 diabetes mellitus with diabetic chronic kidney disease: Secondary | ICD-10-CM | POA: Diagnosis not present

## 2024-09-05 DIAGNOSIS — N329 Bladder disorder, unspecified: Secondary | ICD-10-CM | POA: Diagnosis not present

## 2024-09-05 DIAGNOSIS — M25561 Pain in right knee: Secondary | ICD-10-CM | POA: Diagnosis not present

## 2024-09-05 DIAGNOSIS — R600 Localized edema: Secondary | ICD-10-CM | POA: Diagnosis not present

## 2024-09-05 DIAGNOSIS — K219 Gastro-esophageal reflux disease without esophagitis: Secondary | ICD-10-CM | POA: Diagnosis not present

## 2024-09-05 DIAGNOSIS — R54 Age-related physical debility: Secondary | ICD-10-CM | POA: Diagnosis not present

## 2024-09-05 DIAGNOSIS — N1831 Chronic kidney disease, stage 3a: Secondary | ICD-10-CM | POA: Diagnosis not present

## 2024-09-05 DIAGNOSIS — K862 Cyst of pancreas: Secondary | ICD-10-CM | POA: Diagnosis not present

## 2024-09-05 DIAGNOSIS — I129 Hypertensive chronic kidney disease with stage 1 through stage 4 chronic kidney disease, or unspecified chronic kidney disease: Secondary | ICD-10-CM | POA: Diagnosis not present

## 2024-09-06 DIAGNOSIS — M25562 Pain in left knee: Secondary | ICD-10-CM | POA: Diagnosis not present

## 2024-09-06 DIAGNOSIS — M25561 Pain in right knee: Secondary | ICD-10-CM | POA: Diagnosis not present

## 2024-09-06 DIAGNOSIS — M6259 Muscle wasting and atrophy, not elsewhere classified, multiple sites: Secondary | ICD-10-CM | POA: Diagnosis not present

## 2024-09-06 DIAGNOSIS — R2681 Unsteadiness on feet: Secondary | ICD-10-CM | POA: Diagnosis not present

## 2024-09-07 ENCOUNTER — Other Ambulatory Visit: Payer: Self-pay | Admitting: Physician Assistant

## 2024-09-07 DIAGNOSIS — M170 Bilateral primary osteoarthritis of knee: Secondary | ICD-10-CM | POA: Diagnosis not present

## 2024-09-08 DIAGNOSIS — R2681 Unsteadiness on feet: Secondary | ICD-10-CM | POA: Diagnosis not present

## 2024-09-08 DIAGNOSIS — M25562 Pain in left knee: Secondary | ICD-10-CM | POA: Diagnosis not present

## 2024-09-08 DIAGNOSIS — M6259 Muscle wasting and atrophy, not elsewhere classified, multiple sites: Secondary | ICD-10-CM | POA: Diagnosis not present

## 2024-09-08 DIAGNOSIS — M25561 Pain in right knee: Secondary | ICD-10-CM | POA: Diagnosis not present

## 2024-09-15 DIAGNOSIS — M6259 Muscle wasting and atrophy, not elsewhere classified, multiple sites: Secondary | ICD-10-CM | POA: Diagnosis not present

## 2024-09-15 DIAGNOSIS — M25561 Pain in right knee: Secondary | ICD-10-CM | POA: Diagnosis not present

## 2024-09-15 DIAGNOSIS — M25562 Pain in left knee: Secondary | ICD-10-CM | POA: Diagnosis not present

## 2024-09-15 DIAGNOSIS — R2681 Unsteadiness on feet: Secondary | ICD-10-CM | POA: Diagnosis not present

## 2024-09-20 DIAGNOSIS — M25562 Pain in left knee: Secondary | ICD-10-CM | POA: Diagnosis not present

## 2024-09-20 DIAGNOSIS — M25561 Pain in right knee: Secondary | ICD-10-CM | POA: Diagnosis not present

## 2024-09-20 DIAGNOSIS — M6259 Muscle wasting and atrophy, not elsewhere classified, multiple sites: Secondary | ICD-10-CM | POA: Diagnosis not present

## 2024-09-20 DIAGNOSIS — R2681 Unsteadiness on feet: Secondary | ICD-10-CM | POA: Diagnosis not present

## 2024-09-22 DIAGNOSIS — M25562 Pain in left knee: Secondary | ICD-10-CM | POA: Diagnosis not present

## 2024-09-22 DIAGNOSIS — M6259 Muscle wasting and atrophy, not elsewhere classified, multiple sites: Secondary | ICD-10-CM | POA: Diagnosis not present

## 2024-09-22 DIAGNOSIS — M25561 Pain in right knee: Secondary | ICD-10-CM | POA: Diagnosis not present

## 2024-09-22 DIAGNOSIS — R2681 Unsteadiness on feet: Secondary | ICD-10-CM | POA: Diagnosis not present

## 2024-09-27 DIAGNOSIS — M6259 Muscle wasting and atrophy, not elsewhere classified, multiple sites: Secondary | ICD-10-CM | POA: Diagnosis not present

## 2024-09-27 DIAGNOSIS — M25561 Pain in right knee: Secondary | ICD-10-CM | POA: Diagnosis not present

## 2024-09-27 DIAGNOSIS — M25562 Pain in left knee: Secondary | ICD-10-CM | POA: Diagnosis not present

## 2024-09-27 DIAGNOSIS — R2681 Unsteadiness on feet: Secondary | ICD-10-CM | POA: Diagnosis not present

## 2024-09-28 ENCOUNTER — Other Ambulatory Visit (HOSPITAL_COMMUNITY): Payer: Self-pay

## 2024-09-29 DIAGNOSIS — M25562 Pain in left knee: Secondary | ICD-10-CM | POA: Diagnosis not present

## 2024-09-29 DIAGNOSIS — M25561 Pain in right knee: Secondary | ICD-10-CM | POA: Diagnosis not present

## 2024-09-29 DIAGNOSIS — R2681 Unsteadiness on feet: Secondary | ICD-10-CM | POA: Diagnosis not present

## 2024-09-29 DIAGNOSIS — M6259 Muscle wasting and atrophy, not elsewhere classified, multiple sites: Secondary | ICD-10-CM | POA: Diagnosis not present

## 2024-10-03 ENCOUNTER — Other Ambulatory Visit: Payer: Self-pay

## 2024-10-03 ENCOUNTER — Other Ambulatory Visit (HOSPITAL_COMMUNITY): Payer: Self-pay

## 2024-10-04 DIAGNOSIS — M25562 Pain in left knee: Secondary | ICD-10-CM | POA: Diagnosis not present

## 2024-10-04 DIAGNOSIS — R2681 Unsteadiness on feet: Secondary | ICD-10-CM | POA: Diagnosis not present

## 2024-10-04 DIAGNOSIS — M6259 Muscle wasting and atrophy, not elsewhere classified, multiple sites: Secondary | ICD-10-CM | POA: Diagnosis not present

## 2024-10-04 DIAGNOSIS — M25561 Pain in right knee: Secondary | ICD-10-CM | POA: Diagnosis not present

## 2024-10-07 ENCOUNTER — Other Ambulatory Visit: Payer: Self-pay

## 2024-10-10 ENCOUNTER — Other Ambulatory Visit (HOSPITAL_COMMUNITY): Payer: Self-pay

## 2024-10-10 ENCOUNTER — Telehealth: Payer: Self-pay | Admitting: Internal Medicine

## 2024-10-10 NOTE — Telephone Encounter (Signed)
 Left a detailed message informing patient and patient's daughter that generic Motegrity  is fine to take. Informed them to return my call with any other questions.

## 2024-10-10 NOTE — Telephone Encounter (Signed)
 Patient daughter states insurance will only cover generic version of montegrity. Inquiring if this is ok? Please advise.

## 2024-10-11 ENCOUNTER — Other Ambulatory Visit: Payer: Self-pay

## 2024-10-11 ENCOUNTER — Other Ambulatory Visit (HOSPITAL_COMMUNITY): Payer: Self-pay

## 2024-10-11 DIAGNOSIS — M6259 Muscle wasting and atrophy, not elsewhere classified, multiple sites: Secondary | ICD-10-CM | POA: Diagnosis not present

## 2024-10-11 DIAGNOSIS — M25562 Pain in left knee: Secondary | ICD-10-CM | POA: Diagnosis not present

## 2024-10-11 DIAGNOSIS — M25561 Pain in right knee: Secondary | ICD-10-CM | POA: Diagnosis not present

## 2024-10-11 DIAGNOSIS — R2681 Unsteadiness on feet: Secondary | ICD-10-CM | POA: Diagnosis not present

## 2024-10-12 ENCOUNTER — Other Ambulatory Visit (HOSPITAL_COMMUNITY): Payer: Self-pay

## 2024-10-12 ENCOUNTER — Other Ambulatory Visit: Payer: Self-pay

## 2024-10-12 MED ORDER — PANTOPRAZOLE SODIUM 40 MG PO TBEC
40.0000 mg | DELAYED_RELEASE_TABLET | Freq: Every day | ORAL | 3 refills | Status: AC
  Filled 2024-10-20: qty 30, 30d supply, fill #0
  Filled 2024-11-14: qty 30, 30d supply, fill #1
  Filled 2024-12-14 (×2): qty 30, 30d supply, fill #2
  Filled 2025-01-18: qty 30, 30d supply, fill #3

## 2024-10-13 ENCOUNTER — Other Ambulatory Visit (HOSPITAL_COMMUNITY): Payer: Self-pay

## 2024-10-13 ENCOUNTER — Other Ambulatory Visit: Payer: Self-pay

## 2024-10-13 DIAGNOSIS — M25562 Pain in left knee: Secondary | ICD-10-CM | POA: Diagnosis not present

## 2024-10-13 DIAGNOSIS — M6259 Muscle wasting and atrophy, not elsewhere classified, multiple sites: Secondary | ICD-10-CM | POA: Diagnosis not present

## 2024-10-13 DIAGNOSIS — R2681 Unsteadiness on feet: Secondary | ICD-10-CM | POA: Diagnosis not present

## 2024-10-13 DIAGNOSIS — M25561 Pain in right knee: Secondary | ICD-10-CM | POA: Diagnosis not present

## 2024-10-13 MED ORDER — LOSARTAN POTASSIUM-HCTZ 100-25 MG PO TABS
1.0000 | ORAL_TABLET | Freq: Every day | ORAL | 1 refills | Status: DC
  Filled 2024-10-13: qty 40, 40d supply, fill #0
  Filled 2024-10-20: qty 30, 30d supply, fill #0

## 2024-10-13 MED ORDER — MIRTAZAPINE 15 MG PO TABS
15.0000 mg | ORAL_TABLET | Freq: Every day | ORAL | 5 refills | Status: AC
  Filled 2024-10-13 – 2024-10-20 (×2): qty 30, 30d supply, fill #0
  Filled 2024-11-14: qty 30, 30d supply, fill #1
  Filled 2024-12-14 (×2): qty 30, 30d supply, fill #2
  Filled 2025-01-18: qty 30, 30d supply, fill #3

## 2024-10-13 MED ORDER — CELECOXIB 200 MG PO CAPS
200.0000 mg | ORAL_CAPSULE | Freq: Every day | ORAL | 5 refills | Status: DC
  Filled 2024-10-13 – 2024-10-20 (×2): qty 30, 30d supply, fill #0
  Filled 2024-11-14: qty 30, 30d supply, fill #1
  Filled 2024-12-14: qty 30, 30d supply, fill #2

## 2024-10-13 MED ORDER — TRIAMCINOLONE ACETONIDE 0.1 % EX CREA
TOPICAL_CREAM | CUTANEOUS | 1 refills | Status: AC
  Filled 2024-10-13 – 2024-10-20 (×2): qty 454, 90d supply, fill #0

## 2024-10-13 MED ORDER — CITALOPRAM HYDROBROMIDE 40 MG PO TABS
40.0000 mg | ORAL_TABLET | Freq: Every day | ORAL | 3 refills | Status: AC
  Filled 2024-10-13: qty 90, 90d supply, fill #0
  Filled 2024-10-20: qty 30, 30d supply, fill #0
  Filled 2024-11-14: qty 30, 30d supply, fill #1
  Filled 2024-12-14 (×2): qty 30, 30d supply, fill #2
  Filled 2025-01-18: qty 30, 30d supply, fill #3

## 2024-10-13 MED ORDER — METFORMIN HCL 500 MG PO TABS
500.0000 mg | ORAL_TABLET | Freq: Every day | ORAL | 2 refills | Status: AC
  Filled 2024-10-13: qty 90, 90d supply, fill #0
  Filled 2024-10-20: qty 30, 30d supply, fill #0
  Filled 2024-11-14: qty 30, 30d supply, fill #1
  Filled 2024-12-14 (×2): qty 30, 30d supply, fill #2
  Filled 2025-01-18: qty 30, 30d supply, fill #3

## 2024-10-17 ENCOUNTER — Other Ambulatory Visit: Payer: Self-pay

## 2024-10-17 ENCOUNTER — Other Ambulatory Visit (HOSPITAL_COMMUNITY): Payer: Self-pay

## 2024-10-18 ENCOUNTER — Other Ambulatory Visit: Payer: Self-pay

## 2024-10-18 DIAGNOSIS — M170 Bilateral primary osteoarthritis of knee: Secondary | ICD-10-CM | POA: Diagnosis not present

## 2024-10-19 ENCOUNTER — Other Ambulatory Visit (HOSPITAL_BASED_OUTPATIENT_CLINIC_OR_DEPARTMENT_OTHER): Payer: Self-pay

## 2024-10-20 ENCOUNTER — Encounter: Payer: Self-pay | Admitting: Pharmacist

## 2024-10-20 ENCOUNTER — Other Ambulatory Visit (HOSPITAL_COMMUNITY): Payer: Self-pay

## 2024-10-20 ENCOUNTER — Other Ambulatory Visit: Payer: Self-pay

## 2024-10-20 DIAGNOSIS — M25562 Pain in left knee: Secondary | ICD-10-CM | POA: Diagnosis not present

## 2024-10-20 DIAGNOSIS — M25561 Pain in right knee: Secondary | ICD-10-CM | POA: Diagnosis not present

## 2024-10-20 DIAGNOSIS — M6259 Muscle wasting and atrophy, not elsewhere classified, multiple sites: Secondary | ICD-10-CM | POA: Diagnosis not present

## 2024-10-20 DIAGNOSIS — R2681 Unsteadiness on feet: Secondary | ICD-10-CM | POA: Diagnosis not present

## 2024-10-21 ENCOUNTER — Other Ambulatory Visit: Payer: Self-pay

## 2024-10-24 ENCOUNTER — Other Ambulatory Visit: Payer: Self-pay

## 2024-10-24 ENCOUNTER — Other Ambulatory Visit (HOSPITAL_COMMUNITY): Payer: Self-pay

## 2024-10-25 DIAGNOSIS — M25562 Pain in left knee: Secondary | ICD-10-CM | POA: Diagnosis not present

## 2024-10-25 DIAGNOSIS — M25561 Pain in right knee: Secondary | ICD-10-CM | POA: Diagnosis not present

## 2024-10-25 DIAGNOSIS — M6259 Muscle wasting and atrophy, not elsewhere classified, multiple sites: Secondary | ICD-10-CM | POA: Diagnosis not present

## 2024-10-25 DIAGNOSIS — R2681 Unsteadiness on feet: Secondary | ICD-10-CM | POA: Diagnosis not present

## 2024-10-27 DIAGNOSIS — M6259 Muscle wasting and atrophy, not elsewhere classified, multiple sites: Secondary | ICD-10-CM | POA: Diagnosis not present

## 2024-10-27 DIAGNOSIS — M25561 Pain in right knee: Secondary | ICD-10-CM | POA: Diagnosis not present

## 2024-10-27 DIAGNOSIS — M25562 Pain in left knee: Secondary | ICD-10-CM | POA: Diagnosis not present

## 2024-10-27 DIAGNOSIS — R2681 Unsteadiness on feet: Secondary | ICD-10-CM | POA: Diagnosis not present

## 2024-11-01 DIAGNOSIS — R2681 Unsteadiness on feet: Secondary | ICD-10-CM | POA: Diagnosis not present

## 2024-11-01 DIAGNOSIS — M6259 Muscle wasting and atrophy, not elsewhere classified, multiple sites: Secondary | ICD-10-CM | POA: Diagnosis not present

## 2024-11-01 DIAGNOSIS — M25562 Pain in left knee: Secondary | ICD-10-CM | POA: Diagnosis not present

## 2024-11-01 DIAGNOSIS — M25561 Pain in right knee: Secondary | ICD-10-CM | POA: Diagnosis not present

## 2024-11-03 ENCOUNTER — Other Ambulatory Visit: Payer: Self-pay

## 2024-11-03 DIAGNOSIS — R2681 Unsteadiness on feet: Secondary | ICD-10-CM | POA: Diagnosis not present

## 2024-11-03 DIAGNOSIS — M25561 Pain in right knee: Secondary | ICD-10-CM | POA: Diagnosis not present

## 2024-11-03 DIAGNOSIS — M25562 Pain in left knee: Secondary | ICD-10-CM | POA: Diagnosis not present

## 2024-11-03 DIAGNOSIS — M6259 Muscle wasting and atrophy, not elsewhere classified, multiple sites: Secondary | ICD-10-CM | POA: Diagnosis not present

## 2024-11-08 DIAGNOSIS — R2681 Unsteadiness on feet: Secondary | ICD-10-CM | POA: Diagnosis not present

## 2024-11-08 DIAGNOSIS — M6259 Muscle wasting and atrophy, not elsewhere classified, multiple sites: Secondary | ICD-10-CM | POA: Diagnosis not present

## 2024-11-08 DIAGNOSIS — M25561 Pain in right knee: Secondary | ICD-10-CM | POA: Diagnosis not present

## 2024-11-08 DIAGNOSIS — M25562 Pain in left knee: Secondary | ICD-10-CM | POA: Diagnosis not present

## 2024-11-14 ENCOUNTER — Other Ambulatory Visit (HOSPITAL_COMMUNITY): Payer: Self-pay

## 2024-11-14 ENCOUNTER — Other Ambulatory Visit: Payer: Self-pay

## 2024-11-14 MED ORDER — LOSARTAN POTASSIUM-HCTZ 100-25 MG PO TABS
1.0000 | ORAL_TABLET | Freq: Every day | ORAL | 1 refills | Status: AC
  Filled 2024-11-14: qty 30, 30d supply, fill #0
  Filled 2024-12-14 (×2): qty 30, 30d supply, fill #1
  Filled 2025-01-18: qty 30, 30d supply, fill #2

## 2024-11-15 ENCOUNTER — Other Ambulatory Visit: Payer: Self-pay

## 2024-11-16 ENCOUNTER — Other Ambulatory Visit: Payer: Self-pay

## 2024-11-17 ENCOUNTER — Other Ambulatory Visit: Payer: Self-pay

## 2024-11-17 DIAGNOSIS — M25561 Pain in right knee: Secondary | ICD-10-CM | POA: Diagnosis not present

## 2024-11-17 DIAGNOSIS — R2681 Unsteadiness on feet: Secondary | ICD-10-CM | POA: Diagnosis not present

## 2024-11-17 DIAGNOSIS — M6259 Muscle wasting and atrophy, not elsewhere classified, multiple sites: Secondary | ICD-10-CM | POA: Diagnosis not present

## 2024-11-17 DIAGNOSIS — M25562 Pain in left knee: Secondary | ICD-10-CM | POA: Diagnosis not present

## 2024-12-01 ENCOUNTER — Telehealth: Payer: Self-pay | Admitting: Internal Medicine

## 2024-12-01 NOTE — Telephone Encounter (Signed)
 Patient's daughter states the Motegrity  has really helped and would like a refill. I asked if she wanted Motegrity  sent to [facility] or [facility] out patient pharmacy. Patient's daughter states she will have to reach out to me tomorrow when she finds out.

## 2024-12-01 NOTE — Telephone Encounter (Signed)
 Patient daughter requesting medication refill for [facility].   States patient is taking the medication at night

## 2024-12-02 ENCOUNTER — Other Ambulatory Visit (HOSPITAL_COMMUNITY): Payer: Self-pay

## 2024-12-07 ENCOUNTER — Other Ambulatory Visit (HOSPITAL_COMMUNITY): Payer: Self-pay

## 2024-12-07 ENCOUNTER — Other Ambulatory Visit: Payer: Self-pay

## 2024-12-07 MED ORDER — PRUCALOPRIDE SUCCINATE 2 MG PO TABS
2.0000 mg | ORAL_TABLET | Freq: Every evening | ORAL | 1 refills | Status: DC
  Filled 2024-12-07: qty 90, 90d supply, fill #0
  Filled 2024-12-14 (×2): qty 30, 30d supply, fill #0

## 2024-12-07 NOTE — Addendum Note (Signed)
 Addended by: MADAN, Chavon Lucarelli L on: 12/07/2024 01:43 PM   Modules accepted: Orders

## 2024-12-07 NOTE — Telephone Encounter (Signed)
 Patient's daughter requesting prescription be sent to [facility] pharmacy. Also requesting for instructions to state for patient to medication in the evening time. Please advise, thank you.

## 2024-12-07 NOTE — Telephone Encounter (Signed)
 Prescription sent to patient's pharmacy and instructions on sig updated.

## 2024-12-08 ENCOUNTER — Other Ambulatory Visit: Payer: Self-pay

## 2024-12-09 ENCOUNTER — Other Ambulatory Visit: Payer: Self-pay

## 2024-12-09 ENCOUNTER — Other Ambulatory Visit (HOSPITAL_COMMUNITY): Payer: Self-pay

## 2024-12-14 ENCOUNTER — Other Ambulatory Visit: Payer: Self-pay

## 2024-12-14 ENCOUNTER — Other Ambulatory Visit (HOSPITAL_COMMUNITY): Payer: Self-pay

## 2024-12-14 MED ORDER — CELECOXIB 200 MG PO CAPS
200.0000 mg | ORAL_CAPSULE | Freq: Every day | ORAL | 5 refills | Status: AC
  Filled 2024-12-14: qty 30, 30d supply, fill #0
  Filled 2025-01-18: qty 30, 30d supply, fill #1

## 2024-12-15 ENCOUNTER — Other Ambulatory Visit: Payer: Self-pay

## 2024-12-19 ENCOUNTER — Other Ambulatory Visit: Payer: Self-pay

## 2024-12-20 ENCOUNTER — Other Ambulatory Visit: Payer: Self-pay

## 2024-12-20 ENCOUNTER — Other Ambulatory Visit (HOSPITAL_COMMUNITY): Payer: Self-pay

## 2024-12-20 ENCOUNTER — Encounter: Payer: Self-pay | Admitting: Pharmacist

## 2024-12-21 ENCOUNTER — Other Ambulatory Visit (HOSPITAL_COMMUNITY): Payer: Self-pay

## 2024-12-23 ENCOUNTER — Other Ambulatory Visit: Payer: Self-pay

## 2024-12-23 ENCOUNTER — Telehealth: Payer: Self-pay

## 2024-12-23 MED ORDER — PRUCALOPRIDE SUCCINATE 2 MG PO TABS
2.0000 mg | ORAL_TABLET | Freq: Every day | ORAL | 1 refills | Status: AC
  Filled 2024-12-23: qty 90, 90d supply, fill #0
  Filled 2025-01-18: qty 30, 30d supply, fill #0

## 2024-12-23 NOTE — Telephone Encounter (Signed)
 SABRA

## 2025-01-05 ENCOUNTER — Other Ambulatory Visit (HOSPITAL_COMMUNITY): Payer: Self-pay

## 2025-01-18 ENCOUNTER — Other Ambulatory Visit: Payer: Self-pay

## 2025-01-19 ENCOUNTER — Other Ambulatory Visit: Payer: Self-pay

## 2025-01-20 ENCOUNTER — Other Ambulatory Visit: Payer: Self-pay

## 2025-01-30 ENCOUNTER — Other Ambulatory Visit: Payer: Self-pay

## 2025-01-30 ENCOUNTER — Encounter (HOSPITAL_COMMUNITY): Payer: Self-pay | Admitting: *Deleted

## 2025-01-30 ENCOUNTER — Ambulatory Visit (HOSPITAL_COMMUNITY)
Admission: EM | Admit: 2025-01-30 | Discharge: 2025-01-30 | Disposition: A | Discharge status: Home / Self Care | Source: Home / Self Care | Attending: Family Medicine | Admitting: Family Medicine

## 2025-01-30 DIAGNOSIS — T161XXA Foreign body in right ear, initial encounter: Secondary | ICD-10-CM | POA: Diagnosis not present

## 2025-01-30 NOTE — Discharge Instructions (Signed)
 I have removed the from your hearing aid from the right ear

## 2025-01-30 NOTE — ED Triage Notes (Signed)
 PT presents with the end of hearing aid stuck in RT ear .
